# Patient Record
Sex: Female | Born: 1955 | Race: White | Hispanic: Yes | Marital: Single | Smoking: Never smoker
Health system: Northeastern US, Community
[De-identification: ages and names within clinical notes are randomized; demographics above are authoritative.]

## PROBLEM LIST (undated history)

## (undated) ENCOUNTER — Emergency Department (HOSPITAL_COMMUNITY): Admission: EM | Payer: Medicare (Managed Care) | Source: Home / Self Care

## (undated) DIAGNOSIS — F419 Anxiety disorder, unspecified: Secondary | ICD-10-CM

## (undated) DIAGNOSIS — F32A Depression, unspecified: Secondary | ICD-10-CM

## (undated) DIAGNOSIS — F329 Major depressive disorder, single episode, unspecified: Secondary | ICD-10-CM

## (undated) DIAGNOSIS — H5210 Myopia, unspecified eye: Secondary | ICD-10-CM

## (undated) DIAGNOSIS — H524 Presbyopia: Secondary | ICD-10-CM

## (undated) DIAGNOSIS — Z973 Presence of spectacles and contact lenses: Secondary | ICD-10-CM

## (undated) DIAGNOSIS — M899 Disorder of bone, unspecified: Secondary | ICD-10-CM

## (undated) DIAGNOSIS — E079 Disorder of thyroid, unspecified: Secondary | ICD-10-CM

## (undated) DIAGNOSIS — H52209 Unspecified astigmatism, unspecified eye: Secondary | ICD-10-CM

## (undated) DIAGNOSIS — H269 Unspecified cataract: Secondary | ICD-10-CM

## (undated) DIAGNOSIS — D509 Iron deficiency anemia, unspecified: Secondary | ICD-10-CM

## (undated) DIAGNOSIS — F321 Major depressive disorder, single episode, moderate: Secondary | ICD-10-CM

## (undated) DIAGNOSIS — Z780 Asymptomatic menopausal state: Secondary | ICD-10-CM

## (undated) DIAGNOSIS — M949 Disorder of cartilage, unspecified: Secondary | ICD-10-CM

## (undated) HISTORY — PX: IMPLANT MESH OPN HERNIA RPR/DEBRIDEMENT CLOSURE: PRO145

## (undated) HISTORY — DX: Disorder of cartilage, unspecified: M94.9

## (undated) HISTORY — DX: Myopia, unspecified eye: H52.10

## (undated) HISTORY — DX: Presbyopia: H52.4

## (undated) HISTORY — DX: Major depressive disorder, single episode, moderate: F32.1

## (undated) HISTORY — PX: EXCISION TONSIL TAGS: ENT179

## (undated) HISTORY — DX: Unspecified cataract: H26.9

## (undated) HISTORY — DX: Iron deficiency anemia, unspecified: D50.9

## (undated) HISTORY — DX: Disorder of bone, unspecified: M89.9

## (undated) HISTORY — DX: Disorder of thyroid, unspecified: E07.9

## (undated) HISTORY — DX: Asymptomatic menopausal state: Z78.0

## (undated) HISTORY — DX: Unspecified astigmatism, unspecified eye: H52.209

## (undated) HISTORY — PX: CATARACT REMOVAL INSERTION OF LENS: OPH121

## (undated) HISTORY — PX: HERNIA REPAIR: SHX51

## (undated) HISTORY — PX: TONSILLECTOMY: SUR1361

---

## 2002-06-10 ENCOUNTER — Emergency Department (HOSPITAL_BASED_OUTPATIENT_CLINIC_OR_DEPARTMENT_OTHER): Payer: Self-pay | Admitting: Emergency Medicine

## 2002-06-10 LAB — FINGER, RIGHT 3 VIEWS

## 2002-06-12 ENCOUNTER — Emergency Department (HOSPITAL_BASED_OUTPATIENT_CLINIC_OR_DEPARTMENT_OTHER): Payer: Self-pay | Admitting: Emergency Medicine

## 2002-06-20 ENCOUNTER — Emergency Department (HOSPITAL_BASED_OUTPATIENT_CLINIC_OR_DEPARTMENT_OTHER): Payer: Self-pay | Admitting: Emergency Medicine

## 2002-06-28 ENCOUNTER — Other Ambulatory Visit: Payer: Self-pay

## 2002-06-28 DIAGNOSIS — E0590 Thyrotoxicosis, unspecified without thyrotoxic crisis or storm: Secondary | ICD-10-CM

## 2002-06-28 LAB — TSH (THYROID STIMULATING HORMONE): TSH (THYROID STIM HORMONE): 0.14 u[IU]/mL — ABNORMAL LOW (ref 0.34–5.60)

## 2002-06-28 LAB — CHG ASSAY OF THYROXINE TOTAL: THYROXINE (T4): 9.39 ug/dl (ref 6.09–12.23)

## 2002-08-11 DIAGNOSIS — Z780 Asymptomatic menopausal state: Secondary | ICD-10-CM

## 2002-08-11 HISTORY — DX: Asymptomatic menopausal state: Z78.0

## 2003-09-16 ENCOUNTER — Emergency Department (HOSPITAL_BASED_OUTPATIENT_CLINIC_OR_DEPARTMENT_OTHER): Payer: Self-pay | Admitting: Emergency Medicine

## 2003-09-18 ENCOUNTER — Emergency Department (HOSPITAL_BASED_OUTPATIENT_CLINIC_OR_DEPARTMENT_OTHER): Payer: Self-pay | Admitting: Emergency Medicine

## 2003-09-26 ENCOUNTER — Emergency Department (HOSPITAL_BASED_OUTPATIENT_CLINIC_OR_DEPARTMENT_OTHER): Payer: Self-pay | Admitting: Emergency Medicine

## 2003-09-26 LAB — XR HAND LEFT MINIMUM 3 VIEWS

## 2003-09-28 ENCOUNTER — Ambulatory Visit (HOSPITAL_BASED_OUTPATIENT_CLINIC_OR_DEPARTMENT_OTHER): Payer: Self-pay | Admitting: Plastic Surgery

## 2003-10-19 ENCOUNTER — Ambulatory Visit (HOSPITAL_BASED_OUTPATIENT_CLINIC_OR_DEPARTMENT_OTHER): Payer: Self-pay | Admitting: Plastic Surgery

## 2003-12-17 ENCOUNTER — Emergency Department (HOSPITAL_BASED_OUTPATIENT_CLINIC_OR_DEPARTMENT_OTHER): Payer: Self-pay | Admitting: Emergency Medicine

## 2003-12-21 ENCOUNTER — Encounter (HOSPITAL_BASED_OUTPATIENT_CLINIC_OR_DEPARTMENT_OTHER): Payer: Self-pay | Admitting: Nurse Practitioner

## 2003-12-21 ENCOUNTER — Ambulatory Visit (HOSPITAL_BASED_OUTPATIENT_CLINIC_OR_DEPARTMENT_OTHER): Payer: Self-pay | Admitting: Nurse Practitioner

## 2003-12-27 ENCOUNTER — Ambulatory Visit (HOSPITAL_BASED_OUTPATIENT_CLINIC_OR_DEPARTMENT_OTHER): Payer: Self-pay | Admitting: Nurse Practitioner

## 2003-12-29 ENCOUNTER — Ambulatory Visit (HOSPITAL_BASED_OUTPATIENT_CLINIC_OR_DEPARTMENT_OTHER): Payer: Self-pay | Admitting: Plastic Surgery

## 2004-01-01 ENCOUNTER — Encounter (HOSPITAL_BASED_OUTPATIENT_CLINIC_OR_DEPARTMENT_OTHER): Payer: Self-pay | Admitting: Nurse Practitioner

## 2004-01-01 ENCOUNTER — Ambulatory Visit (HOSPITAL_BASED_OUTPATIENT_CLINIC_OR_DEPARTMENT_OTHER): Payer: MEDICARE | Admitting: Nurse Practitioner

## 2004-01-05 ENCOUNTER — Ambulatory Visit (HOSPITAL_BASED_OUTPATIENT_CLINIC_OR_DEPARTMENT_OTHER): Payer: MEDICARE | Admitting: Nurse Practitioner

## 2004-01-05 ENCOUNTER — Encounter (HOSPITAL_BASED_OUTPATIENT_CLINIC_OR_DEPARTMENT_OTHER): Payer: Self-pay | Admitting: Plastic Surgery

## 2004-01-23 ENCOUNTER — Ambulatory Visit (HOSPITAL_BASED_OUTPATIENT_CLINIC_OR_DEPARTMENT_OTHER): Payer: MEDICARE | Admitting: Internal Medicine

## 2004-01-23 ENCOUNTER — Encounter (HOSPITAL_BASED_OUTPATIENT_CLINIC_OR_DEPARTMENT_OTHER): Payer: Self-pay | Admitting: Internal Medicine

## 2004-01-23 DIAGNOSIS — F4312 Post-traumatic stress disorder, chronic: Secondary | ICD-10-CM

## 2004-01-23 DIAGNOSIS — R5381 Other malaise: Principal | ICD-10-CM

## 2004-01-23 DIAGNOSIS — R5382 Chronic fatigue, unspecified: Secondary | ICD-10-CM | POA: Insufficient documentation

## 2004-01-23 DIAGNOSIS — F3289 Other specified depressive episodes: Secondary | ICD-10-CM

## 2004-01-23 DIAGNOSIS — F329 Major depressive disorder, single episode, unspecified: Secondary | ICD-10-CM

## 2004-01-23 DIAGNOSIS — R5383 Other fatigue: Principal | ICD-10-CM

## 2004-01-24 ENCOUNTER — Other Ambulatory Visit (HOSPITAL_BASED_OUTPATIENT_CLINIC_OR_DEPARTMENT_OTHER): Payer: Self-pay | Admitting: Internal Medicine

## 2004-01-24 DIAGNOSIS — R5381 Other malaise: Principal | ICD-10-CM

## 2004-01-24 DIAGNOSIS — R5383 Other fatigue: Principal | ICD-10-CM

## 2004-01-24 LAB — BLOOD COUNT COMPLETE AUTO&AUTO DIFRNTL WBC
BASOPHIL %: 0.7 % (ref 0–2)
EOSINOPHIL %: 1.1 % (ref 0–7)
HEMATOCRIT: 39 % (ref 37.0–47.0)
HEMOGLOBIN: 13.5 g/dL (ref 12.0–16.0)
LYMPHOCYTE %: 33.6 % (ref 12–39)
MEAN CORP HGB CONC: 34.5 g/dL (ref 32.0–36.0)
MEAN CORPUSCULAR HGB: 33.2 pg — ABNORMAL HIGH (ref 27.0–31.0)
MEAN CORPUSCULAR VOL: 96 fL (ref 81.0–99.0)
MEAN PLATELET VOLUME: 9.4 fL (ref 6.4–10.8)
MONOCYTE %: 8 % (ref 1–12)
NEUTROPHIL %: 56.6 % (ref 46–79)
PLATELET COUNT: 262 10*3/uL (ref 150–400)
RBC DISTRIBUTION WIDTH: 12.9 % (ref 11.5–14.3)
RED BLOOD CELL COUNT: 4.06 MIL/uL — ABNORMAL LOW (ref 4.20–5.40)
WHITE BLOOD CELL COUNT: 4.4 10*3/uL — ABNORMAL LOW (ref 4.8–10.8)

## 2004-01-25 ENCOUNTER — Encounter (HOSPITAL_BASED_OUTPATIENT_CLINIC_OR_DEPARTMENT_OTHER): Payer: Self-pay | Admitting: Internal Medicine

## 2004-01-25 LAB — CHG HEPATIC FUNCTION PANEL
ALANINE AMINOTRANSFERASE: 18 IU/L (ref 3–36)
ALBUMIN: 4.3 g/dl (ref 3.5–5.0)
ALKALINE PHOSPHATASE: 52 IU/L (ref 50–136)
ASPARTATE AMINOTRANSFERASE: 25 U/L (ref 7–29)
BILIRUBIN DIRECT: 0.1 mg/dl (ref 0–0.36)
BILIRUBIN TOTAL: 0.8 mg/dl (ref 0.15–1.0)
TOTAL PROTEIN: 6.8 g/dl (ref 6.2–8.5)

## 2004-01-25 LAB — CHG ASSAY OF FREE THYROXINE: FREE THYROXINE: 1.1 ng/dl (ref 0.55–1.12)

## 2004-01-25 LAB — CHG LIPID PANEL
Cholesterol: 200 mg/dl (ref 0–200)
HIGH DENSITY LIPOPROTEIN: 55 mg/dl (ref 35–95)
LOW DENSITY LIPOPROTEIN DIRECT: 137 mg/dl — ABNORMAL HIGH (ref ?–130)
RISK FACTOR: 3.6 (ref ?–4.4)
TRIGLYCERIDES: 56 mg/dl (ref 30–200)

## 2004-01-25 LAB — BASIC METABOLIC PANEL
BUN (UREA NITROGEN): 17 mg/dl (ref 10–20)
CALCIUM: 9.7 mg/dl (ref 8.5–10.5)
CARBON DIOXIDE: 30 mEQ/L (ref 22–32)
CHLORIDE: 104 mEQ/L (ref 98–110)
CREATININE: 0.8 mg/dl (ref 0.8–1.2)
Glucose Random: 96 mg/dl (ref 65–160)
POTASSIUM: 4.3 mEQ/L (ref 3.5–5.0)
SODIUM: 139 mEQ/L (ref 135–145)

## 2004-01-25 LAB — CYANOCOBALAMIN VITAMIN B-12: VITAMIN B12: 736 pg/ml (ref 180–914)

## 2004-01-25 LAB — THYROID SCREEN TSH REFLEX FT4: THYROID SCREEN TSH REFLEX FT4: 0.15 u[IU]/mL — ABNORMAL LOW (ref 0.34–5.60)

## 2004-01-25 LAB — CHG ASSAY OF FOLIC ACID SERUM: FOLATE: 18.1 ng/ml (ref 3.0–?)

## 2004-01-25 LAB — HEPATITIS C ANTIBODY: HEPATITIS C ANTIBODY: NEGATIVE

## 2004-01-25 LAB — CHG ASSAY OF FERRITIN: FERRITIN: 28 ng/ml (ref 11–307)

## 2004-01-26 ENCOUNTER — Encounter (HOSPITAL_BASED_OUTPATIENT_CLINIC_OR_DEPARTMENT_OTHER): Payer: Self-pay | Admitting: Internal Medicine

## 2004-02-08 ENCOUNTER — Encounter (HOSPITAL_BASED_OUTPATIENT_CLINIC_OR_DEPARTMENT_OTHER): Payer: MEDICARE | Admitting: Dermatology

## 2004-02-20 ENCOUNTER — Telehealth (HOSPITAL_BASED_OUTPATIENT_CLINIC_OR_DEPARTMENT_OTHER): Payer: Self-pay

## 2004-02-20 NOTE — Telephone Encounter (Addendum)
no answer at given number. Message routed to Dr Creed Copper

## 2004-03-13 ENCOUNTER — Ambulatory Visit (HOSPITAL_BASED_OUTPATIENT_CLINIC_OR_DEPARTMENT_OTHER): Payer: MEDICARE | Admitting: Internal Medicine

## 2004-03-13 VITALS — BP 106/66 | Wt 113.0 lb

## 2004-03-13 DIAGNOSIS — E0590 Thyrotoxicosis, unspecified without thyrotoxic crisis or storm: Secondary | ICD-10-CM

## 2004-03-13 DIAGNOSIS — L819 Disorder of pigmentation, unspecified: Secondary | ICD-10-CM

## 2004-03-13 DIAGNOSIS — Z803 Family history of malignant neoplasm of breast: Secondary | ICD-10-CM

## 2004-03-13 DIAGNOSIS — N951 Menopausal and female climacteric states: Principal | ICD-10-CM

## 2004-03-13 DIAGNOSIS — M899 Disorder of bone, unspecified: Secondary | ICD-10-CM

## 2004-03-13 DIAGNOSIS — M949 Disorder of cartilage, unspecified: Secondary | ICD-10-CM

## 2004-03-13 NOTE — Patient Instructions (Addendum)
Labs 6/05: excellent/normal; "subclinical hyperthyroidism" means TSH a bit low, "thyroxine" normal - a lab finding only; not related to fatigue; almost opposite of hypothyroidism and not connected with fatigue etc. - can check again in 3-6 months. I've entered the "lab order" in computer - come any time without appointment to get thyroid tests re-checked - I'll write results.    Hirsutism (excess hair growth): no effective treatments re: creams, tablets etc...laser, electrolysis.    Hot flashes: treatment options: yoga, relaxation, meditation, SSRI anti-depressants, Black Cohosh, (?Remifemin 20 mg twice daily), more exercise - can help.  Prescription treatments: clonidine - started as an anti-depressant; can really work; side effects: dry mouth. If bad dry mouth with pill, can try patch.

## 2004-03-13 NOTE — Progress Notes (Signed)
Cydnee Fuquay is a 48 year old female with fatigue and depression;  Issues today:   Perimenopause: about 4 hot flashes per day; uncomfortable; annoying. 18 mos. since last period. Would like to know what her options are re: menopausal symptoms. Wonders if her fatigue is hormonally-mediated. Are there labs to check for this?  Also: had a small bump frozen off (actinic keratosis?) at left side tip of nose few years ago; sometimes notes brownish spot there; is this a problem?    OBJECTIVE:   Derm: faint suggestion of post-inflammatory hyperpigmentation left side tip of nose; no palpable lesion.  Affect: calmer and somewhat brighter than at last visit.    BMD done at [facility] 03/22/03: osteopenia at femoral neck; normal lumbar spine.    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 instructions"; she opts for trial exercise and Black Cohosh.   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  V16.3 FAMILY HX-BREAST MALIG: due for mammo 03/28/04: req'd.  733.90 Osteopenia: reviewed calcium intake.    Return to clinic 4/06 for next annual or sooner if needed.

## 2004-03-14 DIAGNOSIS — Z803 Family history of malignant neoplasm of breast: Secondary | ICD-10-CM

## 2004-04-03 ENCOUNTER — Ambulatory Visit (HOSPITAL_BASED_OUTPATIENT_CLINIC_OR_DEPARTMENT_OTHER): Payer: MEDICARE | Admitting: Internal Medicine

## 2004-09-20 ENCOUNTER — Ambulatory Visit (HOSPITAL_BASED_OUTPATIENT_CLINIC_OR_DEPARTMENT_OTHER): Payer: MEDICARE | Admitting: Internal Medicine

## 2004-09-20 VITALS — BP 102/62 | Wt 112.0 lb

## 2004-09-20 DIAGNOSIS — IMO0002 Reserved for concepts with insufficient information to code with codable children: Secondary | ICD-10-CM

## 2004-09-21 NOTE — Progress Notes (Signed)
49 year old woman here for evaluation of thumb laceration on left hand. Incurred cut on left thumb when she was cutting something last week with a knife. Did not go to ED afterward. No longer "oozes", no warmth, tenderness, but feels concerned because it has not completely healed.    Exam  Brianna [name]  Left thumb: 5mm laceration, ends beginning to approximate, no erythema, warmth, tenderness, dermis completely lacerated    A/p  Finger lac  Explained to patient that she would have been best off going to ed for a single suture, and that at this stage it is too late to do so.    Advised her to use a bandaid over the lac to help bring the ends closer together, and that they would continue to approximate. No e/o infection

## 2005-01-21 ENCOUNTER — Telehealth (HOSPITAL_BASED_OUTPATIENT_CLINIC_OR_DEPARTMENT_OTHER): Payer: Self-pay

## 2005-01-21 NOTE — Telephone Encounter (Signed)
Staff Message copied by Forbes Cellar on 01/21/2005 at 3:45 PM  ------   Message from: Norwood Levo   Created: 01/21/2005 at 3:41 PM   Regarding: ACCESS   Contact: [phone]    Sole Lengacher [phone], 49 year old, female,     Cleotis Lema NUMBER: [phone]    Patient's language of care: English    Patient does not need an interpreter.    Person calling on behalf of patient: patient (self)    Calls today HER FINGER HURTS AND CAN'T BEND. NOW HER WHOLE ARM IS STARTING TO FEEL SORE. DECLINED FUTURE APPOINTMENT. WOULD LIKE TO BE SEEN TODAY.

## 2005-01-21 NOTE — Telephone Encounter (Signed)
Past 2 weeks right middle finger difficulty bending with some swelling,no reddness, not painful, sensation WNL, finger nail ok, no fever,no trauma no red line up arm. Appointment given for tomorrow. Instructed pt any changes got to ER

## 2005-01-24 ENCOUNTER — Ambulatory Visit (HOSPITAL_BASED_OUTPATIENT_CLINIC_OR_DEPARTMENT_OTHER): Payer: MEDICARE | Admitting: Internal Medicine

## 2005-01-24 VITALS — BP 110/68 | Wt 112.0 lb

## 2005-01-24 DIAGNOSIS — M6530 Trigger finger, unspecified finger: Secondary | ICD-10-CM

## 2005-01-24 NOTE — Progress Notes (Signed)
SUBJECTIVE:  Says right 3rd finger swollen off & on for the last month or so, not painful, but having trouble bending it at times. Denies any trauma to area. Doing a lot of writing recently. Would get stuck in flexed position, now last 2 days having trouble bending it fully.    OBJECTIVE:  BP 110/68  Wt 112 lbs (50.8kg)   pleasant woman in no apparent distress  Right hand: mild soft tissue swelling around 3rd metacarpal, nontender. Sensation normal all digits. No sign of carpal tunnel syndrome. Some triggering noted - full flexion of 3rd finger, needs assistance to extend.    ASSESSMENT:  727.03 TRIGGER FINGER (primary encounter diagnosis)  Note: Advised right 3rd trigger finger, needs Orthopedics eval, possible steroid injection or release.  Plan: REFERRAL TO ORTHOPEDICS (INT)

## 2005-02-25 ENCOUNTER — Encounter (HOSPITAL_BASED_OUTPATIENT_CLINIC_OR_DEPARTMENT_OTHER): Payer: MEDICARE | Admitting: Physician Assistant

## 2005-02-25 DIAGNOSIS — M6530 Trigger finger, unspecified finger: Secondary | ICD-10-CM

## 2005-02-26 LAB — ORTHOPEDIC OFFICE NOTE

## 2005-09-15 ENCOUNTER — Ambulatory Visit (HOSPITAL_BASED_OUTPATIENT_CLINIC_OR_DEPARTMENT_OTHER): Payer: MEDICARE | Admitting: Internal Medicine

## 2005-09-15 VITALS — BP 102/66 | Wt 115.0 lb

## 2005-09-15 DIAGNOSIS — M549 Dorsalgia, unspecified: Principal | ICD-10-CM

## 2005-09-15 NOTE — Progress Notes (Signed)
SUBJECTIVE:  Notes pain in mid-back, told it was muscular, saw Physical Therapy in [place], advised she needs a referral. No specific trauma except a lot of bending, twisting recently. No chest pain, shortness of breath.    OBJECTIVE:  BP 102/66  Wt 115 lbs (52.2kg)   pleasant female in no apparent distress   Back: tender muscular area right side mid-back    ASSESSMENT:  724.5 BACKACHE NOS (primary encounter diagnosis)  Note: Appears muscular. Agree with physical therapy.  Plan: REFERRAL TO PHYSICAL THERAPY (EXT)       follow up as needed.

## 2005-11-17 ENCOUNTER — Ambulatory Visit (HOSPITAL_BASED_OUTPATIENT_CLINIC_OR_DEPARTMENT_OTHER): Payer: MEDICARE | Admitting: Internal Medicine

## 2005-11-17 VITALS — BP 110/80 | Ht 63.0 in | Wt 116.5 lb

## 2005-11-17 DIAGNOSIS — R079 Chest pain, unspecified: Principal | ICD-10-CM

## 2005-11-17 DIAGNOSIS — Z139 Encounter for screening, unspecified: Secondary | ICD-10-CM

## 2005-11-17 LAB — CHG LIPID PANEL
Cholesterol: 189 mg/dl (ref 0–200)
HIGH DENSITY LIPOPROTEIN: 49 mg/dl (ref 35–85)
LOW DENSITY LIPOPROTEIN DIRECT: 106 mg/dl — ABNORMAL HIGH (ref 0–100)
RISK FACTOR: 3.9 (ref ?–4.4)
TRIGLYCERIDES: 54 mg/dl (ref 0–150)

## 2005-11-17 LAB — CHG ASSAY OF FREE THYROXINE: FREE THYROXINE: 0.89 ng/dl (ref 0.55–1.12)

## 2005-11-17 LAB — THYROID SCREEN TSH REFLEX FT4: THYROID SCREEN TSH REFLEX FT4: 0.29 u[IU]/mL — ABNORMAL LOW (ref 0.34–5.60)

## 2005-11-17 NOTE — Progress Notes (Signed)
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  V16.3 FAMILY HX-BREAST MALIG: due for mammo 03/28/04: req'd.  Return to clinic 4/06 for next annual or sooner if needed.     Sched'd sick visit today re: chest pain.  CRFs: none.  HPI: chest pain episodic x 2-3 weeks - indicates L upper chest - "like a grabbing" - sharp - lasts less than a minute - yesterday 2 especially sharp episodes -  Can identify no precipitants; once sitting on T, once sitting in car, once walking down street.  No associated sx's, no numbness/tingling, no lightheadedness, no nausea or panic feelings, no palpitations, no particular positions.   Exercise: walks 15 min/d, sometimes more - 1/2 - 1 hr - feels fine those times, no SSCP. No SOB when walks up 4 flights daily.  ROS: only rare GERD, no heartburn or dyspepsia lately.  Musculoskeletal: no aches or pains. except along shoulders and neck and mid-scapula sometimes. - in Feb.    OBJECTIVE:   BP 110/80  Ht 5\' 3"  (1.41m)  Wt 116 lbs 8.0 oz (52.8kg)   Physical exam: she appears well, alert and appropriate.  Chest is clear to auscultation.   Heart sounds: normal S1 and S2, regular rate and rhythm, no murmurs.   Chest wall: slight tenderness to palpation of chest wall diffusely above L breast.   Breasts are symmetric. No dominant, discrete, fixed or suspicious masses are noted. No skin or nipple changes or axillary nodes.   Musculoskeletal: FROM OK at L shoulder, LUE pulses and DTRs and strength/sens intact. No tenderness to palpation.     EKG: NSR at 56, nl axis, nl intervals, no ST-T changes: nl EKG.     ASSESSMENT/PLAN:   Chest wall pain by hx, exam - Reviewed etiology, normal course, management of. Rvwd presentation of cardiac chest pain, to go to ER if more typical cardiac sx's.  RHM, hyperthyroidism, breast ca screening, crc screening: needs. Labs today, schedule mammo, f/u for pelvic exam.

## 2005-12-09 ENCOUNTER — Other Ambulatory Visit (HOSPITAL_BASED_OUTPATIENT_CLINIC_OR_DEPARTMENT_OTHER): Payer: Self-pay | Admitting: Internal Medicine

## 2005-12-09 LAB — MA SCREENING MAMMO BILATERAL WITH CAD

## 2005-12-23 ENCOUNTER — Other Ambulatory Visit (HOSPITAL_BASED_OUTPATIENT_CLINIC_OR_DEPARTMENT_OTHER): Payer: Self-pay | Admitting: Internal Medicine

## 2006-01-06 ENCOUNTER — Ambulatory Visit (HOSPITAL_BASED_OUTPATIENT_CLINIC_OR_DEPARTMENT_OTHER): Payer: MEDICARE | Admitting: Internal Medicine

## 2006-01-06 VITALS — BP 120/80 | Wt 116.0 lb

## 2006-01-06 DIAGNOSIS — Z124 Encounter for screening for malignant neoplasm of cervix: Secondary | ICD-10-CM

## 2006-01-06 DIAGNOSIS — N952 Postmenopausal atrophic vaginitis: Secondary | ICD-10-CM

## 2006-01-06 DIAGNOSIS — R5381 Other malaise: Secondary | ICD-10-CM

## 2006-01-06 DIAGNOSIS — IMO0002 Reserved for concepts with insufficient information to code with codable children: Secondary | ICD-10-CM

## 2006-01-06 DIAGNOSIS — F4310 Post-traumatic stress disorder, unspecified: Secondary | ICD-10-CM

## 2006-01-06 DIAGNOSIS — R5383 Other fatigue: Secondary | ICD-10-CM

## 2006-01-06 MED ORDER — ESTRACE 0.1 MG/GM VA CREA
TOPICAL_CREAM | VAGINAL | Status: DC
Start: 2006-01-06 — End: 2006-12-21

## 2006-01-06 NOTE — Progress Notes (Signed)
Brianna [name] is a 50 year old female   #1: fatigue. Wonders if CFIDS, but hard to sort out from depression.  #2: dissociation, flashbacks. ? A "conversation" with someone from [facility], a psychiatrist etc - wonders re: ptsd and repressed memories; has no memories of trauma.   #3: physical issues:  - tiny bumps sides of eyes, behind ears  - something itching lower back - felt sudden stinging sensation there, yesterday pm.  - ?cyst upper back  - pain: R trapezius area - PT helps.   She is post menopausal and reports no vaginal bleeding, discharge, vaginal discomfort or pelvic pain. No hot flashes. Last sexually active 1-2 yrs ago, had some dyspareunia then.    OBJECTIVE:   BP 120/80  Wt 116 lbs (52.6kg)  LMP Postmenopausal  Physical exam: she appears well, alert and appropriate, affect: anxious initially. She has a few areas of milia at areas she indicates near eyes and behind pinna. Neck supple, without adenopathy or masses. Back: 3-mm sebaceous cyst upper back; 4-mm patch of erythema blanches with pressure L side low back, looks consistent with insect bite.   Chest is clear to auscultation. Heart sounds: normal S1 and S2, regular rate and rhythm, no murmurs. Abdomen is soft, no tenderness, masses or organomegaly. Breasts: normal without suspicious masses, skin or nipple changes or axillary nodes. Pelvis: EGBUS within normal limits, trace fissure consistent with slight candida at perineum, introitus: atrophic mucosa, normal vaginal vault, normal cervix without lesions. Uterus is normal size and shape, no masses or tenderness. Adnexae normal, without masses or tenderness. Extremities are normal, without edema. Peripheral pulses are normal. Screening neurological exam is normal without focal findings. Skin is normal without suspicious lesions noted.     ASSESSMENT/PLAN:   RHM: cpe done.  780.79 MALAISE AND FATIGUE NEC labs ordered, as pnd.  627.3 ATROPHIC VAGINITIS: rx'd.  911.4 INSECT BITE TRUNK: expectant  management.  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

## 2006-01-20 DIAGNOSIS — Z139 Encounter for screening, unspecified: Secondary | ICD-10-CM

## 2006-01-20 LAB — CYTOPATH, C/V, THIN LAYER

## 2006-02-06 ENCOUNTER — Other Ambulatory Visit (HOSPITAL_BASED_OUTPATIENT_CLINIC_OR_DEPARTMENT_OTHER): Payer: MEDICARE | Admitting: Lab

## 2006-02-06 DIAGNOSIS — D509 Iron deficiency anemia, unspecified: Secondary | ICD-10-CM

## 2006-02-06 DIAGNOSIS — R5383 Other fatigue: Principal | ICD-10-CM

## 2006-02-06 DIAGNOSIS — R5381 Other malaise: Principal | ICD-10-CM

## 2006-02-06 LAB — GLUCOSE RANDOM: Glucose Random: 111 mg/dl (ref 74–160)

## 2006-02-06 LAB — CHG ASSAY OF FERRITIN: FERRITIN: 20 ng/mL (ref 11–307)

## 2006-02-06 LAB — BLOOD COUNT COMPLETE AUTOMATED
HEMATOCRIT: 35.6 % — ABNORMAL LOW (ref 37.0–47.0)
HEMOGLOBIN: 12.4 g/dL (ref 12.0–16.0)
MEAN CORP HGB CONC: 34.8 g/dL (ref 32.0–36.0)
MEAN CORPUSCULAR HGB: 32.2 pg — ABNORMAL HIGH (ref 27.0–31.0)
MEAN CORPUSCULAR VOL: 92.6 fL (ref 81.0–99.0)
MEAN PLATELET VOLUME: 9.4 fL (ref 6.4–10.8)
PLATELET COUNT: 259 10*3/uL (ref 150–400)
RBC DISTRIBUTION WIDTH: 13.4 % (ref 11.5–14.3)
RED BLOOD CELL COUNT: 3.85 MIL/uL — ABNORMAL LOW (ref 4.20–5.40)
WHITE BLOOD CELL COUNT: 4.4 10*3/uL — ABNORMAL LOW (ref 4.8–10.8)

## 2006-02-06 LAB — TRANSFERASE ALANINE AMINO ALT SGPT: ALANINE AMINOTRANSFERASE: 15 IU/L (ref 7–35)

## 2006-02-06 LAB — CYANOCOBALAMIN VITAMIN B-12: VITAMIN B12: 372 pg/mL (ref 180–914)

## 2006-02-06 LAB — THYROID SCREEN TSH REFLEX FT4: THYROID SCREEN TSH REFLEX FT4: 0.44 u[IU]/mL (ref 0.34–5.60)

## 2006-02-06 NOTE — Nursing Note (Signed)
>>   Brianna [name] [facility]     02/13/2006   8:51 am  Labs drawn.

## 2006-02-09 LAB — VITAMIN D,25 HYDROXY: VITAMIN D,25 HYDROXY: 34.2 ng/mL (ref 32.0–100.0)

## 2006-02-15 DIAGNOSIS — D509 Iron deficiency anemia, unspecified: Secondary | ICD-10-CM

## 2006-02-15 HISTORY — DX: Iron deficiency anemia, unspecified: D50.9

## 2006-02-15 NOTE — Progress Notes (Addendum)
Addended by: Loann Quill on: 02/15/2006 10:04:12 PM     Modules accepted: Orders

## 2006-02-18 ENCOUNTER — Ambulatory Visit (HOSPITAL_BASED_OUTPATIENT_CLINIC_OR_DEPARTMENT_OTHER): Payer: Self-pay

## 2006-02-18 DIAGNOSIS — Z0120 Encounter for dental examination and cleaning without abnormal findings: Principal | ICD-10-CM

## 2006-03-05 ENCOUNTER — Encounter (HOSPITAL_BASED_OUTPATIENT_CLINIC_OR_DEPARTMENT_OTHER): Payer: Self-pay | Admitting: Internal Medicine

## 2006-04-17 ENCOUNTER — Encounter (HOSPITAL_BASED_OUTPATIENT_CLINIC_OR_DEPARTMENT_OTHER): Payer: Self-pay

## 2006-04-21 ENCOUNTER — Encounter (HOSPITAL_BASED_OUTPATIENT_CLINIC_OR_DEPARTMENT_OTHER): Payer: Self-pay

## 2006-05-04 ENCOUNTER — Encounter (HOSPITAL_BASED_OUTPATIENT_CLINIC_OR_DEPARTMENT_OTHER): Payer: Self-pay | Admitting: Psychologist

## 2006-05-04 ENCOUNTER — Ambulatory Visit (HOSPITAL_BASED_OUTPATIENT_CLINIC_OR_DEPARTMENT_OTHER): Payer: MEDICARE | Admitting: Psychologist

## 2006-05-04 ENCOUNTER — Ambulatory Visit (HOSPITAL_BASED_OUTPATIENT_CLINIC_OR_DEPARTMENT_OTHER): Payer: MEDICAID | Admitting: Psychologist

## 2006-05-04 DIAGNOSIS — F331 Major depressive disorder, recurrent, moderate: Secondary | ICD-10-CM

## 2006-05-04 DIAGNOSIS — F4481 Dissociative identity disorder: Secondary | ICD-10-CM
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
## 2006-05-08 ENCOUNTER — Encounter (HOSPITAL_BASED_OUTPATIENT_CLINIC_OR_DEPARTMENT_OTHER): Payer: Self-pay | Admitting: Internal Medicine

## 2006-05-25 ENCOUNTER — Telehealth (HOSPITAL_BASED_OUTPATIENT_CLINIC_OR_DEPARTMENT_OTHER): Payer: Self-pay | Admitting: Psychologist

## 2006-05-25 NOTE — Telephone Encounter (Signed)
Left message for Pt that she has been referred to [facility] and will be called for tx pending confirmation of insurance.

## 2006-06-02 ENCOUNTER — Telehealth (HOSPITAL_BASED_OUTPATIENT_CLINIC_OR_DEPARTMENT_OTHER): Payer: Self-pay | Admitting: Psychologist

## 2006-06-02 NOTE — Telephone Encounter (Signed)
Spoke with Pt who asks if she calls and tries to process material about her former tx with her former therapist, will he Research scientist (medical). She also asks if she contacts former therapist and sees him will this affect where she is on the waitlist at [facility].    I do not know answer to former and report to latter that there should be no effect at all on her place in the waitlist.

## 2006-06-17 ENCOUNTER — Telehealth (HOSPITAL_BASED_OUTPATIENT_CLINIC_OR_DEPARTMENT_OTHER): Payer: Self-pay | Admitting: Psychologist

## 2006-06-17 NOTE — Telephone Encounter (Signed)
Returned Pt's message from today. Pt asking about how long she will have to wait to begin tx.  Left Pt a message, as she said I could, that we are trying to determine in which group she will best served, [facility] or [facility].  I told her a senior clinician, Melany Guernsey, will be contacting her for one or two appointments so we can best determine placement esp in light of Dr. Ria Bush referral suggestion for [facility].  I also said she was at the top of the list for [facility] and that the consultation process would not delay her starting tx.

## 2006-06-25 ENCOUNTER — Ambulatory Visit (HOSPITAL_BASED_OUTPATIENT_CLINIC_OR_DEPARTMENT_OTHER): Payer: MEDICARE | Admitting: Clinical

## 2006-06-25 ENCOUNTER — Encounter (HOSPITAL_BASED_OUTPATIENT_CLINIC_OR_DEPARTMENT_OTHER): Payer: Self-pay | Admitting: Clinical

## 2006-06-25 DIAGNOSIS — F4310 Post-traumatic stress disorder, unspecified: Secondary | ICD-10-CM

## 2006-06-29 ENCOUNTER — Telehealth (HOSPITAL_BASED_OUTPATIENT_CLINIC_OR_DEPARTMENT_OTHER): Payer: Self-pay | Admitting: Clinical

## 2006-06-29 NOTE — Telephone Encounter (Signed)
Contacted trauma clinic and faxewd release to Adron Bene at [phone]

## 2006-06-29 NOTE — Progress Notes (Signed)
Met with patient for evaluation as to where she would best receive services. Patient reported struggles with dissociation dating back for an extended period of time. She does not report a history of significant trauma consistent with the dissociative identity disorder diagnosis that she had been given. She clearly, however, reports extensive dissociation. Patient has been in treatment for most of the past few decades. She is unaware if she has had any nueropsych testing. I asked patient to sign a release so that I can request these records.

## 2006-07-09 ENCOUNTER — Ambulatory Visit (HOSPITAL_BASED_OUTPATIENT_CLINIC_OR_DEPARTMENT_OTHER): Payer: MEDICARE | Admitting: Clinical

## 2006-07-09 DIAGNOSIS — F4310 Post-traumatic stress disorder, unspecified: Principal | ICD-10-CM

## 2006-07-13 NOTE — Progress Notes (Signed)
Brianna [name] attended second session on this date. Focused upon desired counseling goals and life goals. Also signed release to [facility] as the trauma clinic no longer holds their own medical records from the time she was in treatment there. [name] could clearly benefit from inidividual treatment in conjunction with psychopharm. At this time she is more open to the idea of considering medication. I am also requesting the past records to see if there has been testing. If not, a testing request will also be made.

## 2006-08-17 ENCOUNTER — Ambulatory Visit (HOSPITAL_BASED_OUTPATIENT_CLINIC_OR_DEPARTMENT_OTHER): Payer: MEDICARE | Admitting: Clinical

## 2006-08-17 DIAGNOSIS — F331 Major depressive disorder, recurrent, moderate: Secondary | ICD-10-CM

## 2006-08-17 DIAGNOSIS — F449 Dissociative and conversion disorder, unspecified: Principal | ICD-10-CM

## 2006-08-24 DIAGNOSIS — F449 Dissociative and conversion disorder, unspecified: Secondary | ICD-10-CM | POA: Insufficient documentation

## 2006-08-24 DIAGNOSIS — F331 Major depressive disorder, recurrent, moderate: Secondary | ICD-10-CM | POA: Insufficient documentation

## 2006-08-24 NOTE — Progress Notes (Signed)
PSYCHIATRY OUTPATIENT PROGRESS NOTE    VISIT TYPE: Individual therapy     INTERPRETER: No interpreter needed.    PROBLEMS which this visit addressed:   Problem 1: depression    Problem 2: dissociation     Problem 3: social isolation        SOURCE(S) OF INFORMATION: Patient     SUBJECTIVE FINDINGS: "I'm glad to be finally working with someone. My dissociation has been a problem for a long time."     OBJECTIVE FINDINGS:   Pertinent positive and negative parts of mental status exam: see below.     Signs and symptoms: Depressed mood, intermittment dissociative symptoms. No active suicidality. No evidence of psychotic process.     Current medications (n/a for psychotherapy only visits): N/A     Medications taken as prescribed (n/a for psychotherapy only visits): N/A    Medication side effects - including movement disorders/AIMS score (n/a for psychotherapy only visits): N/A     Testing results: No test results pending.     Risk behaviors: None reported.       ASSESSMENT:  Clinical formulation: Patient coming to [facility] from private practice work seeking to better understand her long standing depressive and dissociative symptoms. Is on disability, has stable living situation.     Clinical interventions today and patient's response: Patient amenable to initial meeting.    Dual diagnosis stage of change: No dual diagnosis    Medical necessity for today's visit: Decrease dissociative and depressive symptoms.    Risk level per scale:   Suicide: low (1)   Violence: low (1)   Addiction: low (1)    DIAGNOSES:  Axis I (primary): 300.15     Axis I (other): 296.32    Axis II: deferred     Axis III: chronic fatigue        Axis IV: Moderate      Axis V: 55      PLAN: Continue individual therapy.     Risk plan (for patients at moderate/high risk for suicide/violence/addiction): Patient not at moderate or high risk.    Next visit: patient to be seen in one week.     FOR PSYCHOPHARMACOLOGY VISITS ONLY  Pregnancy status:  N/A    Medication plan:   N/A     Medication education: N/A    Medical work-up plan/testing: N/A    Instructions to covering prescriber: N/A     Amount of time spent w/patient today: N/A     Biagio Borg, LICSW

## 2006-08-26 ENCOUNTER — Ambulatory Visit (HOSPITAL_BASED_OUTPATIENT_CLINIC_OR_DEPARTMENT_OTHER): Payer: MEDICARE | Admitting: Clinical

## 2006-08-26 DIAGNOSIS — F449 Dissociative and conversion disorder, unspecified: Principal | ICD-10-CM

## 2006-08-26 DIAGNOSIS — F331 Major depressive disorder, recurrent, moderate: Secondary | ICD-10-CM

## 2006-08-26 NOTE — Progress Notes (Signed)
PSYCHIATRY OUTPATIENT PROGRESS NOTE      VISIT TYPE: Individual therapy      INTERPRETER: No interpreter needed.      PROBLEMS which this visit addressed:   Problem 1: depression      Problem 2: dissociation      Problem 3: social isolation         SOURCE(S) OF INFORMATION: Patient      SUBJECTIVE FINDINGS: "It feels good to be asked that and to be thinking about when my dad was jovial."      OBJECTIVE FINDINGS:   Pertinent positive and negative parts of mental status exam: see below.      Signs and symptoms: Moderately depressed. No active SI/HI.     Current medications (n/a for psychotherapy only visits): N/A      Medications taken as prescribed (n/a for psychotherapy only visits): N/A      Medication side effects - including movement disorders/AIMS score (n/a for psychotherapy only visits): N/A      Testing results: No test results pending.      Risk behaviors: None reported.         ASSESSMENT:   Clinical formulation: Moved by exploring remembrance of early years with father.      Clinical interventions today and patient's response: Exploration and support. Patient positive and affectively present.     Dual diagnosis stage of change: No dual diagnosis      Medical necessity for today's visit: Decrease dissociative and depressive symptoms.      Risk level per scale:    Suicide: low (1)    Violence: low (1)    Addiction: low (1)      DIAGNOSES:   Axis I (primary): 300.15      Axis I (other): 296.32     Axis II: deferred      Axis III: chronic fatigue         Axis IV: Moderate       Axis V: 55         PLAN: Continue individual therapy.      Risk plan (for patients at moderate/high risk for suicide/violence/addiction): Patient not at moderate or high risk.      Next visit: patient to be seen in two weeks d/2 holiday.     FOR PSYCHOPHARMACOLOGY VISITS ONLY   Pregnancy status: N/A      Medication plan:   N/A      Medication education: N/A      Medical work-up plan/testing: N/A      Instructions to covering prescriber:  N/A      Amount of time spent w/patient today: N/A      Biagio Borg, LICSW

## 2006-09-02 ENCOUNTER — Ambulatory Visit (HOSPITAL_BASED_OUTPATIENT_CLINIC_OR_DEPARTMENT_OTHER): Payer: MEDICARE | Admitting: Clinical

## 2006-09-02 DIAGNOSIS — F331 Major depressive disorder, recurrent, moderate: Secondary | ICD-10-CM

## 2006-09-02 DIAGNOSIS — F449 Dissociative and conversion disorder, unspecified: Secondary | ICD-10-CM
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
## 2006-09-07 ENCOUNTER — Ambulatory Visit (HOSPITAL_BASED_OUTPATIENT_CLINIC_OR_DEPARTMENT_OTHER): Payer: MEDICARE | Admitting: Clinical

## 2006-09-07 DIAGNOSIS — F449 Dissociative and conversion disorder, unspecified: Secondary | ICD-10-CM

## 2006-09-07 DIAGNOSIS — F331 Major depressive disorder, recurrent, moderate: Secondary | ICD-10-CM

## 2006-09-07 NOTE — Progress Notes (Signed)
PSYCHIATRY OUTPATIENT PROGRESS NOTE      VISIT TYPE: Individual therapy      INTERPRETER: No interpreter needed.      PROBLEMS which this visit addressed:   Problem 1: depression      Problem 2: dissociation      Problem 3: social isolation         SOURCE(S) OF INFORMATION: Patient      SUBJECTIVE FINDINGS: "I need to know we have a framework."      OBJECTIVE FINDINGS:   Pertinent positive and negative parts of mental status exam: see below.      Signs and symptoms: Moderately depressed. No active SI/HI.      Current medications (n/a for psychotherapy only visits): N/A      Medications taken as prescribed (n/a for psychotherapy only visits): N/A      Medication side effects - including movement disorders/AIMS score (n/a for psychotherapy only visits): N/A      Testing results: No test results pending.      Risk behaviors: None reported.         ASSESSMENT:   Clinical formulation: Fearing that her therapy will be uncontained as the past treatments have been.     Clinical interventions today and patient's response: Exploration and support.      Dual diagnosis stage of change: No dual diagnosis      Medical necessity for today's visit: Decrease dissociative and depressive symptoms.      Risk level per scale:    Suicide: low (1)    Violence: low (1)    Addiction: low (1)      DIAGNOSES:   Axis I (primary): 300.15      Axis I (other): 296.32     Axis II: deferred      Axis III: chronic fatigue         Axis IV: Moderate       Axis V: 55         PLAN: Continue individual therapy.      Risk plan (for patients at moderate/high risk for suicide/violence/addiction): Patient not at moderate or high risk.      Next visit: 09/14/06.    FOR PSYCHOPHARMACOLOGY VISITS ONLY   Pregnancy status: N/A      Medication plan:   N/A      Medication education: N/A      Medical work-up plan/testing: N/A      Instructions to covering prescriber: N/A      Amount of time spent w/patient today: N/A      Biagio Borg, LICSW

## 2006-09-14 ENCOUNTER — Ambulatory Visit (HOSPITAL_BASED_OUTPATIENT_CLINIC_OR_DEPARTMENT_OTHER): Payer: MEDICARE | Admitting: Clinical

## 2006-09-14 DIAGNOSIS — F449 Dissociative and conversion disorder, unspecified: Secondary | ICD-10-CM

## 2006-09-14 NOTE — Progress Notes (Signed)
PSYCHIATRY OUTPATIENT PROGRESS NOTE      VISIT TYPE: Individual therapy      INTERPRETER: No interpreter needed.      PROBLEMS which this visit addressed:   Problem 1: depression      Problem 2: dissociation      Problem 3: social isolation         SOURCE(S) OF INFORMATION: Patient      SUBJECTIVE FINDINGS: "It's hard for me when you're late for session."     OBJECTIVE FINDINGS:   Pertinent positive and negative parts of mental status exam: see below.      Signs and symptoms: Moderately depressed. No active SI/HI.      Current medications (n/a for psychotherapy only visits): N/A      Medications taken as prescribed (n/a for psychotherapy only visits): N/A      Medication side effects - including movement disorders/AIMS score (n/a for psychotherapy only visits): N/A      Testing results: No test results pending.      Risk behaviors: None reported.         ASSESSMENT:   Clinical formulation: Processing apprehensions about treatment relationship.      Clinical interventions today and patient's response: Exploration and support.      Dual diagnosis stage of change: No dual diagnosis      Medical necessity for today's visit: Decrease dissociative and depressive symptoms.      Risk level per scale:    Suicide: low (1)    Violence: low (1)    Addiction: low (1)      DIAGNOSES:   Axis I (primary): 300.15      Axis I (other): 296.32     Axis II: deferred      Axis III: chronic fatigue         Axis IV: Moderate       Axis V: 55         PLAN: Continue individual therapy.      Risk plan (for patients at moderate/high risk for suicide/violence/addiction): Patient not at moderate or high risk.      Next visit: 09/21/06.      FOR PSYCHOPHARMACOLOGY VISITS ONLY   Pregnancy status: N/A      Medication plan:   N/A      Medication education: N/A      Medical work-up plan/testing: N/A      Instructions to covering prescriber: N/A      Amount of time spent w/patient today: N/A      Biagio Borg, LICSW

## 2006-09-21 ENCOUNTER — Ambulatory Visit (HOSPITAL_BASED_OUTPATIENT_CLINIC_OR_DEPARTMENT_OTHER): Payer: MEDICARE | Admitting: Clinical

## 2006-09-21 DIAGNOSIS — F449 Dissociative and conversion disorder, unspecified: Principal | ICD-10-CM

## 2006-09-21 NOTE — Progress Notes (Signed)
PSYCHIATRY OUTPATIENT PROGRESS NOTE      VISIT TYPE: Individual therapy      INTERPRETER: No interpreter needed.      PROBLEMS which this visit addressed:   Problem 1: depression      Problem 2: dissociation      Problem 3: social isolation         SOURCE(S) OF INFORMATION: Patient      SUBJECTIVE FINDINGS: "I feel like others sense my need and they go away."     OBJECTIVE FINDINGS:   Pertinent positive and negative parts of mental status exam: see below.      Signs and symptoms: Moderately depressed. No active SI/HI.      Current medications (n/a for psychotherapy only visits): N/A      Medications taken as prescribed (n/a for psychotherapy only visits): N/A      Medication side effects - including movement disorders/AIMS score (n/a for psychotherapy only visits): N/A      Testing results: No test results pending.      Risk behaviors: None reported.         ASSESSMENT:   Clinical formulation: Looking at ways she can better care and sooth herself; noticing the effect her anxiety and sadness has on others.     Clinical interventions today and patient's response: Exploration and support.      Dual diagnosis stage of change: No dual diagnosis      Medical necessity for today's visit: Decrease dissociative and depressive symptoms.      Risk level per scale:    Suicide: low (1)    Violence: low (1)    Addiction: low (1)      DIAGNOSES:   Axis I (primary): 300.15      Axis I (other): 296.32     Axis II: deferred      Axis III: chronic fatigue         Axis IV: Moderate       Axis V: 55         PLAN: Continue individual therapy.      Risk plan (for patients at moderate/high risk for suicide/violence/addiction): Patient not at moderate or high risk.      Next visit: 10/08/06.      FOR PSYCHOPHARMACOLOGY VISITS ONLY   Pregnancy status: N/A      Medication plan:   N/A      Medication education: N/A      Medical work-up plan/testing: N/A      Instructions to covering prescriber: N/A      Amount of time spent w/patient today: N/A       Biagio Borg, LICSW

## 2006-09-22 ENCOUNTER — Ambulatory Visit (HOSPITAL_BASED_OUTPATIENT_CLINIC_OR_DEPARTMENT_OTHER): Payer: MEDICARE | Admitting: Internal Medicine

## 2006-09-22 VITALS — BP 124/84 | Wt 120.0 lb

## 2006-09-22 DIAGNOSIS — H524 Presbyopia: Secondary | ICD-10-CM

## 2006-09-22 DIAGNOSIS — R5383 Other fatigue: Principal | ICD-10-CM

## 2006-09-22 DIAGNOSIS — R5381 Other malaise: Secondary | ICD-10-CM

## 2006-09-22 LAB — BLOOD COUNT COMPLETE AUTOMATED
HEMATOCRIT: 38.8 % (ref 36.0–48.0)
HEMOGLOBIN: 13 g/dl (ref 12.0–16.0)
MEAN CORP HGB CONC: 33.6 g/dl (ref 32.0–36.0)
MEAN CORPUSCULAR HGB: 31.6 pg (ref 27.0–33.0)
MEAN CORPUSCULAR VOL: 94 fl (ref 80.0–100.0)
MEAN PLATELET VOLUME: 9.1 fl (ref 6.4–10.8)
PLATELET COUNT: 258 10*3/uL (ref 150–400)
RBC DISTRIBUTION WIDTH: 12.3 % (ref 11.5–14.3)
RED BLOOD CELL COUNT: 4.13 M/uL — ABNORMAL LOW (ref 4.50–5.10)
WHITE BLOOD CELL COUNT: 5.9 10*3/uL (ref 4.0–10.8)

## 2006-09-22 LAB — CHG ASSAY OF FERRITIN: FERRITIN: 25 ng/mL (ref 11–307)

## 2006-09-22 NOTE — Progress Notes (Signed)
Brianna [name] is a 51 year old w  Here re: f/u, GI, anemia: seen by Brianna [name] 02/24/06, egd/cscopy 09/09/06: told all nl.  Also, records recently rcvd, Brianna [name], '03. Nothing of signif.  GI: ROS: flatulence.   ROS generally: energy a bit low, sleep often disrupted.  Psych: seeing provider at [facility].    OBJECTIVE:   BP 124/84  Wt 120 lbs (54.4kg)   Looks generally at baseline.   Exam not indicated.     ASSESSMENT/PLAN:   Anemia: rpt  Rhm - crc screening done - rvwd  Schedule cpe - 4 months  Ptsd - in tx - discussed  Counselling-oriented visit, 15 minutes spent face-to-face with patient, of which greater than 50% was direct counselling; topics covered included ptsd, fatigue, anemia, crc screening

## 2006-09-23 ENCOUNTER — Ambulatory Visit (HOSPITAL_BASED_OUTPATIENT_CLINIC_OR_DEPARTMENT_OTHER): Payer: MEDICARE | Admitting: Psychologist

## 2006-09-23 ENCOUNTER — Encounter (HOSPITAL_BASED_OUTPATIENT_CLINIC_OR_DEPARTMENT_OTHER): Payer: Self-pay | Admitting: Internal Medicine

## 2006-09-30 ENCOUNTER — Ambulatory Visit (HOSPITAL_BASED_OUTPATIENT_CLINIC_OR_DEPARTMENT_OTHER): Payer: MEDICARE | Admitting: Psychologist

## 2006-09-30 NOTE — Progress Notes (Signed)
OUTPATIENT PSYCHIATRY GROUP PROGRESS NOTE    Patient Name: Brianna [name]    Group Name: Trauma and the Body    Leaders: Delbert Harness and Jayme Shorin     Service Type: [id number] Group Psychotherapy     Length of Group: 90 minutes           Purpose of Group (choose all that apply):     Symptom relief  Affect regulation  Illness management  Skills training/rehab  Interpersonal skill development  Support (psychological, family, community resources)  Insight and behavior change           Group Process: Group members engaged in topic - supporting one another. Topics included centering and self-care. Activity of meditation walking.    Individual Patient Participation:     Had difficulty tolerating group  Minimal participation  Quiet, but attentive to process      Diagnosis (addressed by this group): PTSD    Medical Necessity of Session (how treatment is necessary to improve symptoms, functioning, or prevent worsening): improve physical and psychological functioning.    Relevant Changes in Mental Status: No.     Risk Level per Scale:  Suicide: low  Violence: low  Addiction: low    Current risk level represents increase in risk: No.       Meghanne Pletz Bolduc-Hicks 09/30/2006

## 2006-10-05 ENCOUNTER — Ambulatory Visit (HOSPITAL_BASED_OUTPATIENT_CLINIC_OR_DEPARTMENT_OTHER): Payer: MEDICARE | Admitting: Clinical

## 2006-10-07 ENCOUNTER — Ambulatory Visit (HOSPITAL_BASED_OUTPATIENT_CLINIC_OR_DEPARTMENT_OTHER): Payer: MEDICARE | Admitting: Psychologist

## 2006-10-07 ENCOUNTER — Telehealth (HOSPITAL_BASED_OUTPATIENT_CLINIC_OR_DEPARTMENT_OTHER): Payer: Self-pay | Admitting: Psychologist

## 2006-10-08 ENCOUNTER — Ambulatory Visit (HOSPITAL_BASED_OUTPATIENT_CLINIC_OR_DEPARTMENT_OTHER): Payer: MEDICARE | Admitting: Clinical

## 2006-10-08 DIAGNOSIS — F449 Dissociative and conversion disorder, unspecified: Secondary | ICD-10-CM

## 2006-10-08 DIAGNOSIS — F4310 Post-traumatic stress disorder, unspecified: Secondary | ICD-10-CM

## 2006-10-08 NOTE — Progress Notes (Signed)
PSYCHIATRY OUTPATIENT PROGRESS NOTE      VISIT TYPE: Individual therapy      INTERPRETER: No interpreter needed.      PROBLEMS which this visit addressed:   Problem 1: depression      Problem 2: dissociation      Problem 3: social isolation         SOURCE(S) OF INFORMATION: Patient      SUBJECTIVE FINDINGS: "I don't feel like you're hearing me."     OBJECTIVE FINDINGS:   Pertinent positive and negative parts of mental status exam: see below.      Signs and symptoms: Moderately depressed. No active SI/HI.      Current medications (n/a for psychotherapy only visits): N/A      Medications taken as prescribed (n/a for psychotherapy only visits): N/A      Medication side effects - including movement disorders/AIMS score (n/a for psychotherapy only visits): N/A      Testing results: No test results pending.      Risk behaviors: None reported.         ASSESSMENT:   Clinical formulation: Illustrating her relational anxiety in sensitivity to therapist's responses.     Clinical interventions today and patient's response: Exploration and support.      Dual diagnosis stage of change: No dual diagnosis      Medical necessity for today's visit: Decrease dissociative and depressive symptoms.      Risk level per scale:    Suicide: low (1)    Violence: low (1)    Addiction: low (1)      DIAGNOSES:   Axis I (primary): 300.15      Axis I (other): 296.32     Axis II: deferred      Axis III: chronic fatigue         Axis IV: Moderate       Axis V: 55         PLAN: Continue individual therapy.      Risk plan (for patients at moderate/high risk for suicide/violence/addiction): Patient not at moderate or high risk.      Next visit: 10/12/06     FOR PSYCHOPHARMACOLOGY VISITS ONLY   Pregnancy status: N/A      Medication plan:   N/A      Medication education: N/A      Medical work-up plan/testing: N/A      Instructions to covering prescriber: N/A      Amount of time spent w/patient today: N/A      Biagio Borg, LICSW

## 2006-10-12 ENCOUNTER — Ambulatory Visit (HOSPITAL_BASED_OUTPATIENT_CLINIC_OR_DEPARTMENT_OTHER): Payer: MEDICARE | Admitting: Clinical

## 2006-10-12 DIAGNOSIS — F331 Major depressive disorder, recurrent, moderate: Secondary | ICD-10-CM

## 2006-10-12 NOTE — Progress Notes (Signed)
PSYCHIATRY OUTPATIENT PROGRESS NOTE      VISIT TYPE: Individual therapy      INTERPRETER: No interpreter needed.      PROBLEMS which this visit addressed:   Problem 1: depression      Problem 2: dissociation      Problem 3: social isolation         SOURCE(S) OF INFORMATION: Patient      SUBJECTIVE FINDINGS: Spoke about depressive illness of both parents.      OBJECTIVE FINDINGS:   Pertinent positive and negative parts of mental status exam: see below.      Signs and symptoms: Moderately depressed. No active SI/HI.      Current medications (n/a for psychotherapy only visits): N/A      Medications taken as prescribed (n/a for psychotherapy only visits): N/A      Medication side effects - including movement disorders/AIMS score (n/a for psychotherapy only visits): N/A      Testing results: No test results pending.      Risk behaviors: None reported.         ASSESSMENT:   Clinical formulation: Exploring impact of parents' psychological issues on her childhood experience.     Clinical interventions today and patient's response: Exploration and support.      Dual diagnosis stage of change: No dual diagnosis      Medical necessity for today's visit: Decrease dissociative and depressive symptoms.      Risk level per scale:    Suicide: low (1)    Violence: low (1)    Addiction: low (1)      DIAGNOSES:   Axis I (primary): 300.15      Axis I (other): 296.32     Axis II: deferred      Axis III: chronic fatigue         Axis IV: Moderate       Axis V: 55         PLAN: Continue individual therapy.      Risk plan (for patients at moderate/high risk for suicide/violence/addiction): Patient not at moderate or high risk.      Next visit: 10/19/06      FOR PSYCHOPHARMACOLOGY VISITS ONLY   Pregnancy status: N/A      Medication plan:   N/A      Medication education: N/A      Medical work-up plan/testing: N/A      Instructions to covering prescriber: N/A      Amount of time spent w/patient today: N/A      Biagio Borg, LICSW

## 2006-10-14 ENCOUNTER — Ambulatory Visit (HOSPITAL_BASED_OUTPATIENT_CLINIC_OR_DEPARTMENT_OTHER): Payer: MEDICARE | Admitting: Psychologist

## 2006-10-15 ENCOUNTER — Ambulatory Visit (HOSPITAL_BASED_OUTPATIENT_CLINIC_OR_DEPARTMENT_OTHER): Payer: Self-pay

## 2006-10-19 ENCOUNTER — Ambulatory Visit (HOSPITAL_BASED_OUTPATIENT_CLINIC_OR_DEPARTMENT_OTHER): Payer: MEDICARE | Admitting: Clinical

## 2006-10-19 DIAGNOSIS — F331 Major depressive disorder, recurrent, moderate: Secondary | ICD-10-CM
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
## 2006-10-21 ENCOUNTER — Ambulatory Visit (HOSPITAL_BASED_OUTPATIENT_CLINIC_OR_DEPARTMENT_OTHER): Payer: MEDICARE | Admitting: Psychologist

## 2006-10-22 ENCOUNTER — Other Ambulatory Visit: Payer: MEDICARE | Admitting: Ophthalmology

## 2006-10-22 DIAGNOSIS — H01009 Unspecified blepharitis unspecified eye, unspecified eyelid: Principal | ICD-10-CM

## 2006-10-22 DIAGNOSIS — H524 Presbyopia: Secondary | ICD-10-CM

## 2006-10-22 DIAGNOSIS — H5210 Myopia, unspecified eye: Secondary | ICD-10-CM

## 2006-10-26 ENCOUNTER — Ambulatory Visit (HOSPITAL_BASED_OUTPATIENT_CLINIC_OR_DEPARTMENT_OTHER): Payer: MEDICARE | Admitting: Clinical

## 2006-10-26 DIAGNOSIS — F449 Dissociative and conversion disorder, unspecified: Secondary | ICD-10-CM

## 2006-10-26 NOTE — Progress Notes (Signed)
PSYCHIATRY OUTPATIENT PROGRESS NOTE      VISIT TYPE: Individual therapy      INTERPRETER: No interpreter needed.      PROBLEMS which this visit addressed:   Problem 1: depression      Problem 2: dissociation      Problem 3: social isolation         SOURCE(S) OF INFORMATION: Patient      SUBJECTIVE FINDINGS: "A sad part of me comes out when I do this."     OBJECTIVE FINDINGS:   Pertinent positive and negative parts of mental status exam: see below.      Signs and symptoms: Moderately depressed. No active SI/HI.      Current medications (n/a for psychotherapy only visits): N/A      Medications taken as prescribed (n/a for psychotherapy only visits): N/A      Medication side effects - including movement disorders/AIMS score (n/a for psychotherapy only visits): N/A      Testing results: No test results pending.      Risk behaviors: None reported.         ASSESSMENT:   Clinical formulation: Exploring different parts of self with safe place imagery.      Clinical interventions today and patient's response: Exploration and support.      Dual diagnosis stage of change: No dual diagnosis      Medical necessity for today's visit: Decrease dissociative and depressive symptoms.      Risk level per scale:    Suicide: low (1)    Violence: low (1)    Addiction: low (1)      DIAGNOSES:   Axis I (primary): 300.15      Axis I (other): 296.32     Axis II: deferred      Axis III: chronic fatigue         Axis IV: Moderate       Axis V: 55         PLAN: Continue individual therapy.      Risk plan (for patients at moderate/high risk for suicide/violence/addiction): Patient not at moderate or high risk.      Next visit: 11/02/06     FOR PSYCHOPHARMACOLOGY VISITS ONLY   Pregnancy status: N/A      Medication plan:   N/A      Medication education: N/A      Medical work-up plan/testing: N/A      Instructions to covering prescriber: N/A      Amount of time spent w/patient today: N/A      Biagio Borg, LICSW

## 2006-10-28 ENCOUNTER — Ambulatory Visit (HOSPITAL_BASED_OUTPATIENT_CLINIC_OR_DEPARTMENT_OTHER): Payer: MEDICARE | Admitting: Psychologist

## 2006-11-02 ENCOUNTER — Ambulatory Visit (HOSPITAL_BASED_OUTPATIENT_CLINIC_OR_DEPARTMENT_OTHER): Payer: MEDICARE | Admitting: Clinical

## 2006-11-02 DIAGNOSIS — F331 Major depressive disorder, recurrent, moderate: Secondary | ICD-10-CM

## 2006-11-04 ENCOUNTER — Ambulatory Visit (HOSPITAL_BASED_OUTPATIENT_CLINIC_OR_DEPARTMENT_OTHER): Payer: MEDICARE | Admitting: Psychologist

## 2006-11-05 DIAGNOSIS — F331 Major depressive disorder, recurrent, moderate: Principal | ICD-10-CM

## 2006-11-05 NOTE — Progress Notes (Signed)
PSYCHIATRY OUTPATIENT PROGRESS NOTE      VISIT TYPE: Individual therapy      INTERPRETER: No interpreter needed.      PROBLEMS which this visit addressed:   Problem 1: depression      Problem 2: dissociation      Problem 3: social isolation         SOURCE(S) OF INFORMATION: Patient      SUBJECTIVE FINDINGS: "This is hard for me."     OBJECTIVE FINDINGS:   Pertinent positive and negative parts of mental status exam: see below.      Signs and symptoms: Moderately depressed. No active SI/HI.      Current medications (n/a for psychotherapy only visits): N/A      Medications taken as prescribed (n/a for psychotherapy only visits): N/A      Medication side effects - including movement disorders/AIMS score (n/a for psychotherapy only visits): N/A      Testing results: No test results pending.      Risk behaviors: None reported.         ASSESSMENT:   Clinical formulation: Establishing safety in treatment relationship.     Clinical interventions today and patient's response: Exploration and support.      Dual diagnosis stage of change: No dual diagnosis      Medical necessity for today's visit: Decrease dissociative and depressive symptoms.      Risk level per scale:    Suicide: low (1)    Violence: low (1)    Addiction: low (1)      DIAGNOSES:   Axis I (primary): 300.15      Axis I (other): 296.32     Axis II: deferred      Axis III: chronic fatigue         Axis IV: Moderate       Axis V: 55         PLAN: Continue individual therapy.      Risk plan (for patients at moderate/high risk for suicide/violence/addiction): Patient not at moderate or high risk.      Next visit: 11/09/06     FOR PSYCHOPHARMACOLOGY VISITS ONLY   Pregnancy status: N/A      Medication plan:   N/A      Medication education: N/A      Medical work-up plan/testing: N/A      Instructions to covering prescriber: N/A      Amount of time spent w/patient today: N/A      Biagio Borg, LICSW

## 2006-11-09 ENCOUNTER — Ambulatory Visit (HOSPITAL_BASED_OUTPATIENT_CLINIC_OR_DEPARTMENT_OTHER): Payer: MEDICARE | Admitting: Clinical

## 2006-11-09 DIAGNOSIS — F331 Major depressive disorder, recurrent, moderate: Secondary | ICD-10-CM

## 2006-11-09 NOTE — Progress Notes (Signed)
PSYCHIATRY OUTPATIENT PROGRESS NOTE      VISIT TYPE: Individual therapy      INTERPRETER: No interpreter needed.      PROBLEMS which this visit addressed:   Problem 1: depression      Problem 2: dissociation      Problem 3: social isolation         SOURCE(S) OF INFORMATION: Patient      SUBJECTIVE FINDINGS: Patient spoke about contentious relationship with sister.     OBJECTIVE FINDINGS:   Pertinent positive and negative parts of mental status exam: see below.      Signs and symptoms: Moderately depressed. No active SI/HI.      Current medications (n/a for psychotherapy only visits): N/A      Medications taken as prescribed (n/a for psychotherapy only visits): N/A      Medication side effects - including movement disorders/AIMS score (n/a for psychotherapy only visits): N/A      Testing results: No test results pending.      Risk behaviors: None reported.         ASSESSMENT:   Clinical formulation: Exploring antecedents of difficulty with assertiveness.     Clinical interventions today and patient's response: Exploration and support.      Dual diagnosis stage of change: No dual diagnosis      Medical necessity for today's visit: Decrease dissociative and depressive symptoms.      Risk level per scale:    Suicide: low (1)    Violence: low (1)    Addiction: low (1)      DIAGNOSES:   Axis I (primary): 300.15      Axis I (other): 296.32     Axis II: deferred      Axis III: chronic fatigue         Axis IV: Moderate       Axis V: 55         PLAN: Continue individual therapy.      Risk plan (for patients at moderate/high risk for suicide/violence/addiction): Patient not at moderate or high risk.      Next visit: 11/16/06     FOR PSYCHOPHARMACOLOGY VISITS ONLY   Pregnancy status: N/A      Medication plan:   N/A      Medication education: N/A      Medical work-up plan/testing: N/A      Instructions to covering prescriber: N/A      Amount of time spent w/patient today: N/A      Biagio Borg, LICSW

## 2006-11-11 ENCOUNTER — Ambulatory Visit (HOSPITAL_BASED_OUTPATIENT_CLINIC_OR_DEPARTMENT_OTHER): Payer: MEDICARE | Admitting: Psychologist

## 2006-11-16 ENCOUNTER — Ambulatory Visit (HOSPITAL_BASED_OUTPATIENT_CLINIC_OR_DEPARTMENT_OTHER): Payer: MEDICARE | Admitting: Clinical

## 2006-11-16 DIAGNOSIS — F4310 Post-traumatic stress disorder, unspecified: Secondary | ICD-10-CM

## 2006-11-16 NOTE — Progress Notes (Signed)
PSYCHIATRY OUTPATIENT PROGRESS NOTE      VISIT TYPE: Individual therapy      INTERPRETER: No interpreter needed.      PROBLEMS which this visit addressed:   Problem 1: depression      Problem 2: dissociation      Problem 3: social isolation         SOURCE(S) OF INFORMATION: Patient      SUBJECTIVE FINDINGS: "It helps me to focus on our relationship."     OBJECTIVE FINDINGS:   Pertinent positive and negative parts of mental status exam: see below.      Signs and symptoms: Moderately depressed. No active SI/HI.      Current medications (n/a for psychotherapy only visits): N/A      Medications taken as prescribed (n/a for psychotherapy only visits): N/A      Medication side effects - including movement disorders/AIMS score (n/a for psychotherapy only visits): N/A      Testing results: No test results pending.      Risk behaviors: None reported.         ASSESSMENT:   Clinical formulation: Looking at treatment relationship and its connection to patient's struggles in outside world.     Clinical interventions today and patient's response: Exploration and support.      Dual diagnosis stage of change: No dual diagnosis      Medical necessity for today's visit: Decrease dissociative and depressive symptoms.      Risk level per scale:    Suicide: low (1)    Violence: low (1)    Addiction: low (1)      DIAGNOSES:   Axis I (primary): 300.15      Axis I (other): 296.32     Axis II: deferred      Axis III: chronic fatigue         Axis IV: Moderate       Axis V: 55         PLAN: Continue individual therapy.      Risk plan (for patients at moderate/high risk for suicide/violence/addiction): Patient not at moderate or high risk.      Next visit: 11/23/06     FOR PSYCHOPHARMACOLOGY VISITS ONLY   Pregnancy status: N/A      Medication plan:   N/A      Medication education: N/A      Medical work-up plan/testing: N/A      Instructions to covering prescriber: N/A      Amount of time spent w/patient today: N/A      Biagio Borg,  LICSW

## 2006-11-18 ENCOUNTER — Ambulatory Visit (HOSPITAL_BASED_OUTPATIENT_CLINIC_OR_DEPARTMENT_OTHER): Payer: MEDICARE | Admitting: Psychologist

## 2006-11-20 ENCOUNTER — Ambulatory Visit: Payer: Self-pay | Admitting: Optometry

## 2006-11-20 ENCOUNTER — Encounter (HOSPITAL_BASED_OUTPATIENT_CLINIC_OR_DEPARTMENT_OTHER): Payer: Self-pay

## 2006-11-20 DIAGNOSIS — H5210 Myopia, unspecified eye: Principal | ICD-10-CM

## 2006-11-23 ENCOUNTER — Ambulatory Visit (HOSPITAL_BASED_OUTPATIENT_CLINIC_OR_DEPARTMENT_OTHER): Payer: MEDICARE | Admitting: Clinical

## 2006-11-23 DIAGNOSIS — F4310 Post-traumatic stress disorder, unspecified: Secondary | ICD-10-CM

## 2006-11-23 NOTE — Progress Notes (Signed)
PSYCHIATRY OUTPATIENT PROGRESS NOTE      VISIT TYPE: Individual therapy      INTERPRETER: No interpreter needed.      PROBLEMS which this visit addressed:   Problem 1: depression      Problem 2: dissociation      Problem 3: social isolation         SOURCE(S) OF INFORMATION: Patient      SUBJECTIVE FINDINGS: "I need to talk about my feelings in the relationship."     OBJECTIVE FINDINGS:   Pertinent positive and negative parts of mental status exam: see below.      Signs and symptoms: Moderately depressed. No active SI/HI.      Current medications (n/a for psychotherapy only visits): N/A      Medications taken as prescribed (n/a for psychotherapy only visits): N/A      Medication side effects - including movement disorders/AIMS score (n/a for psychotherapy only visits): N/A      Testing results: No test results pending.      Risk behaviors: None reported.         ASSESSMENT:   Clinical formulation: Continuing to think about the legacy of past treatments on current relationship.     Clinical interventions today and patient's response: Exploration and support.      Dual diagnosis stage of change: No dual diagnosis      Medical necessity for today's visit: Decrease dissociative and depressive symptoms.      Risk level per scale:    Suicide: low (1)    Violence: low (1)    Addiction: low (1)      DIAGNOSES:   Axis I (primary): 300.15      Axis I (other): 296.32     Axis II: deferred      Axis III: chronic fatigue         Axis IV: Moderate       Axis V: 55         PLAN: Continue individual therapy.      Risk plan (for patients at moderate/high risk for suicide/violence/addiction): Patient not at moderate or high risk.      Next visit: 12/09/06     FOR PSYCHOPHARMACOLOGY VISITS ONLY   Pregnancy status: N/A      Medication plan:   N/A      Medication education: N/A      Medical work-up plan/testing: N/A      Instructions to covering prescriber: N/A      Amount of time spent w/patient today: N/A      Biagio Borg, LICSW

## 2006-11-24 ENCOUNTER — Ambulatory Visit: Payer: Self-pay | Admitting: Optometry

## 2006-11-24 DIAGNOSIS — H5210 Myopia, unspecified eye: Principal | ICD-10-CM

## 2006-11-25 ENCOUNTER — Ambulatory Visit (HOSPITAL_BASED_OUTPATIENT_CLINIC_OR_DEPARTMENT_OTHER): Payer: MEDICARE | Admitting: Psychologist

## 2006-12-02 ENCOUNTER — Ambulatory Visit (HOSPITAL_BASED_OUTPATIENT_CLINIC_OR_DEPARTMENT_OTHER): Payer: MEDICARE | Admitting: Psychologist

## 2006-12-07 ENCOUNTER — Ambulatory Visit (HOSPITAL_BASED_OUTPATIENT_CLINIC_OR_DEPARTMENT_OTHER): Payer: MEDICARE | Admitting: Clinical

## 2006-12-09 ENCOUNTER — Ambulatory Visit (HOSPITAL_BASED_OUTPATIENT_CLINIC_OR_DEPARTMENT_OTHER): Payer: MEDICARE | Admitting: Clinical

## 2006-12-09 ENCOUNTER — Ambulatory Visit (HOSPITAL_BASED_OUTPATIENT_CLINIC_OR_DEPARTMENT_OTHER): Payer: MEDICARE | Admitting: Psychologist

## 2006-12-09 DIAGNOSIS — F4310 Post-traumatic stress disorder, unspecified: Principal | ICD-10-CM

## 2006-12-14 ENCOUNTER — Ambulatory Visit (HOSPITAL_BASED_OUTPATIENT_CLINIC_OR_DEPARTMENT_OTHER): Payer: MEDICARE | Admitting: Clinical

## 2006-12-14 DIAGNOSIS — F449 Dissociative and conversion disorder, unspecified: Principal | ICD-10-CM

## 2006-12-14 NOTE — Progress Notes (Signed)
PSYCHIATRY OUTPATIENT PROGRESS NOTE      VISIT TYPE: Individual therapy      INTERPRETER: No interpreter needed.      PROBLEMS which this visit addressed:   Problem 1: depression      Problem 2: dissociation      Problem 3: social isolation         SOURCE(S) OF INFORMATION: Patient      SUBJECTIVE FINDINGS: "I wish you could say it the way I want."     OBJECTIVE FINDINGS:   Pertinent positive and negative parts of mental status exam: see below.      Signs and symptoms: Moderately depressed. No active SI/HI.      Current medications (n/a for psychotherapy only visits): N/A      Medications taken as prescribed (n/a for psychotherapy only visits): N/A      Medication side effects - including movement disorders/AIMS score (n/a for psychotherapy only visits): N/A      Testing results: No test results pending.      Risk behaviors: None reported.         ASSESSMENT:   Clinical formulation: Struggling with frustration tolerance in treatment.     Clinical interventions today and patient's response: Exploration and support.      Dual diagnosis stage of change: No dual diagnosis      Medical necessity for today's visit: Decrease dissociative and depressive symptoms.      Risk level per scale:    Suicide: low (1)    Violence: low (1)    Addiction: low (1)      DIAGNOSES:   Axis I (primary): 300.15      Axis I (other): 296.32     Axis II: deferred      Axis III: chronic fatigue         Axis IV: Moderate       Axis V: 55         PLAN: Continue individual therapy.      Risk plan (for patients at moderate/high risk for suicide/violence/addiction): Patient not at moderate or high risk.      Next visit: 12/14/06     FOR PSYCHOPHARMACOLOGY VISITS ONLY   Pregnancy status: N/A      Medication plan:   N/A      Medication education: N/A      Medical work-up plan/testing: N/A      Instructions to covering prescriber: N/A      Amount of time spent w/patient today: N/A      Biagio Borg, LICSW

## 2006-12-14 NOTE — Progress Notes (Signed)
PSYCHIATRY OUTPATIENT PROGRESS NOTE      VISIT TYPE: Individual therapy      INTERPRETER: No interpreter needed.      PROBLEMS which this visit addressed:   Problem 1: depression      Problem 2: dissociation      Problem 3: social isolation         SOURCE(S) OF INFORMATION: Patient      SUBJECTIVE FINDINGS: "I feel like this therapy is becoming like with Roman."     OBJECTIVE FINDINGS:   Pertinent positive and negative parts of mental status exam: see below.      Signs and symptoms: Moderately depressed. No active SI/HI.      Current medications (n/a for psychotherapy only visits): N/A      Medications taken as prescribed (n/a for psychotherapy only visits): N/A      Medication side effects - including movement disorders/AIMS score (n/a for psychotherapy only visits): N/A      Testing results: No test results pending.      Risk behaviors: None reported.         ASSESSMENT:   Clinical formulation: Acutely sensitive to possible disconnections in treatment relationship; craving a consistent mirroring response.     Clinical interventions today and patient's response: Exploration and support.      Dual diagnosis stage of change: No dual diagnosis      Medical necessity for today's visit: Decrease dissociative and depressive symptoms.      Risk level per scale:    Suicide: low (1)    Violence: low (1)    Addiction: low (1)      DIAGNOSES:   Axis I (primary): 300.15      Axis I (other): 296.32     Axis II: deferred      Axis III: chronic fatigue         Axis IV: Moderate       Axis V: 50-55         PLAN: Continue individual therapy.      Risk plan (for patients at moderate/high risk for suicide/violence/addiction): Patient not at moderate or high risk.      Next visit: 12/23/06     FOR PSYCHOPHARMACOLOGY VISITS ONLY   Pregnancy status: N/A      Medication plan:   N/A      Medication education: N/A      Medical work-up plan/testing: N/A      Instructions to covering prescriber: N/A      Amount of time spent w/patient  today: N/A      Biagio Borg, LICSW

## 2006-12-15 ENCOUNTER — Ambulatory Visit: Payer: MEDICARE | Admitting: Optometry

## 2006-12-15 DIAGNOSIS — H01009 Unspecified blepharitis unspecified eye, unspecified eyelid: Secondary | ICD-10-CM

## 2006-12-21 ENCOUNTER — Ambulatory Visit (HOSPITAL_BASED_OUTPATIENT_CLINIC_OR_DEPARTMENT_OTHER): Payer: MEDICARE | Admitting: Internal Medicine

## 2006-12-21 VITALS — BP 104/70 | Ht 63.0 in | Wt 119.0 lb

## 2006-12-21 DIAGNOSIS — F331 Major depressive disorder, recurrent, moderate: Secondary | ICD-10-CM

## 2006-12-21 DIAGNOSIS — Z124 Encounter for screening for malignant neoplasm of cervix: Secondary | ICD-10-CM

## 2006-12-21 DIAGNOSIS — N951 Menopausal and female climacteric states: Secondary | ICD-10-CM

## 2006-12-21 DIAGNOSIS — R5383 Other fatigue: Secondary | ICD-10-CM

## 2006-12-21 DIAGNOSIS — M21619 Bunion of unspecified foot: Secondary | ICD-10-CM

## 2006-12-21 DIAGNOSIS — R5381 Other malaise: Secondary | ICD-10-CM

## 2006-12-21 DIAGNOSIS — N952 Postmenopausal atrophic vaginitis: Secondary | ICD-10-CM

## 2006-12-21 DIAGNOSIS — Z803 Family history of malignant neoplasm of breast: Secondary | ICD-10-CM

## 2006-12-21 DIAGNOSIS — L659 Nonscarring hair loss, unspecified: Secondary | ICD-10-CM

## 2006-12-21 MED ORDER — ESTRACE 0.1 MG/GM VA CREA
TOPICAL_CREAM | VAGINAL | Status: DC
Start: 2006-12-21 — End: 2007-11-28

## 2006-12-21 NOTE — Progress Notes (Signed)
Brianna [name] is a 51 year old w  Here for annual exam.  Maybe moving from [facility] to a therapist who someone who does [facility] via hypnosis -   Moved; unpacking. This makes her more aware of her fatigue.  She has Q's about:  Hair - seems to be falling out - notes increased hair falling. Also, why did she go grey so rapidly - metabolic explanations? Also: Q's re: bunion.  ROS: No unusual headaches, no dysphagia. No cough. No dyspnea or chest pain on exertion. No abdominal pain, change in bowel habits. No urinary tract symptoms. No new or unusual musculoskeletal symptoms. She is post menopausal and reports no vaginal bleeding, discharge or pelvic pain. Does have some hot flashes. She is not sexually active. No breast lumps, breast pain or nipple discharge.     OBJECTIVE: BP 104/70  Ht 5\' 3"  (1.6 m)  Wt 119 lb (53.978 kg)    Physical exam: she appears well, alert and appropriate. HEENT: scalp, hair exam nl. Neck supple, without adenopathy or masses. No thyromegaly. Chest is clear to auscultation. Heart sounds: normal S1 and S2, regular rate and rhythm, no murmurs. Abdomen is soft, no tenderness, masses or organomegaly. Breasts: normal without suspicious masses, skin or nipple changes or axillary nodes. Pelvis: EGBUS: atrophic introitus. Normal vaginal vault, normal cervix without lesions. Uterus is normal size and shape, no masses or tenderness. Adnexae normal, without masses or tenderness. Exam is limited by her discomfort (atrophic vaginitis). Extremities are normal, without edema. Screening neurological exam is normal without focal findings. Skin is normal without suspicious lesions noted.    Labs:   Component Reference Range 02/06/2006  THYROID SCREEN TSH 0.34-5.60 uIU/mL 0.44   Component Reference Range 11/17/2005  HDL 35-85 mg/dl 49  LDL 3-875 mg/dl 643     ASSESSMENT/PLAN:   Cpe done.  V76.2 Screening for Malignant Neoplasm of the Cervix  Plan: PAP SMEAR THIN PREP     296.32 Major  Depressive Disorder, Recurrent Episode, Moderate  Comment: in tx.  Plan: continue.    V16.3 FAMILY HX-BREAST MALIG  Plan: mammo req.    780.79 FATIGUE GENERAL  Comment: ongoing - no changes.  Plan: supportive, lifestyle management.    Other issues discussed, Reviewed etiology, normal course, management of bunion, telogen effluvium and androgenic alopecia.

## 2006-12-23 ENCOUNTER — Ambulatory Visit (HOSPITAL_BASED_OUTPATIENT_CLINIC_OR_DEPARTMENT_OTHER): Payer: MEDICARE | Admitting: Clinical

## 2006-12-23 DIAGNOSIS — F449 Dissociative and conversion disorder, unspecified: Principal | ICD-10-CM

## 2006-12-28 NOTE — Progress Notes (Signed)
PSYCHIATRY OUTPATIENT PROGRESS NOTE      VISIT TYPE: Individual therapy      INTERPRETER: No interpreter needed.      PROBLEMS which this visit addressed:   Problem 1: depression      Problem 2: dissociation      Problem 3: social isolation         SOURCE(S) OF INFORMATION: Patient      SUBJECTIVE FINDINGS: "Maybe a consult would be helpful."     OBJECTIVE FINDINGS:   Pertinent positive and negative parts of mental status exam: see below.      Signs and symptoms: Moderately depressed. No active SI/HI.      Current medications (n/a for psychotherapy only visits): N/A      Medications taken as prescribed (n/a for psychotherapy only visits): N/A      Medication side effects - including movement disorders/AIMS score (n/a for psychotherapy only visits): N/A      Testing results: No test results pending.      Risk behaviors: None reported.         ASSESSMENT:   Clinical formulation: Processing feelings about therapy and exploring possible solutions.     Clinical interventions today and patient's response: Exploration and support.      Dual diagnosis stage of change: No dual diagnosis      Medical necessity for today's visit: Decrease dissociative and depressive symptoms.      Risk level per scale:    Suicide: low (1)    Violence: low (1)    Addiction: low (1)      DIAGNOSES:   Axis I (primary): 300.15      Axis I (other): 296.32     Axis II: deferred      Axis III: chronic fatigue         Axis IV: Moderate       Axis V: 50-55         PLAN: Continue individual therapy.      Risk plan (for patients at moderate/high risk for suicide/violence/addiction): Patient not at moderate or high risk.      Next visit: To be determined     FOR PSYCHOPHARMACOLOGY VISITS ONLY   Pregnancy status: N/A      Medication plan:   N/A      Medication education: N/A      Medical work-up plan/testing: N/A      Instructions to covering prescriber: N/A      Amount of time spent w/patient today: N/A      Biagio Borg, LICSW

## 2006-12-29 LAB — CYTOPATH, C/V, THIN LAYER

## 2006-12-30 ENCOUNTER — Ambulatory Visit (HOSPITAL_BASED_OUTPATIENT_CLINIC_OR_DEPARTMENT_OTHER): Payer: MEDICARE | Admitting: Clinical

## 2006-12-30 DIAGNOSIS — F449 Dissociative and conversion disorder, unspecified: Secondary | ICD-10-CM

## 2006-12-30 NOTE — Progress Notes (Signed)
PSYCHIATRY OUTPATIENT PROGRESS NOTE    VISIT TYPE: consultation to treatment     INTERPRETER: no    PROBLEMS which this visit addressed:   Consultation to ongoing psychotherapy in [facility] with Biagio Borg     SOURCE(S) OF INFORMATION: Patient and Therapist     SUBJECTIVE FINDINGS: "I feel that I am an empty shell, I need to experience a sense of acceptance from my therapist in order to develop a sense of acceptance of myself."        OBJECTIVE FINDINGS:   Pertinent positive and negative parts of mental status exam: Reports feeling that she would like more exploration in context of her ongoing treatment with Robin. Asking for increase sense of acceptance of affective states. Wondering whether this is a workable therapy.     Signs and symptoms: Reports a history of becoming scared in context of dyadic relationships and reports therapists have reported a "power struggle" in context of her treatment in the past. Reports when feeling lack of acceptance in treatment can become hypervigilant and overscrutinous of therapist interventions.     Current medications (n/a for psychotherapy only visits):     Medications taken as prescribed (n/a for psychotherapy only visits):     Medication side effects - including movement disorders/AIMS score (n/a for psychotherapy only visits):     Testing results:     Risk behaviors:       ASSESSMENT:  Clinical formulation: 51 year old single female wanting to get better sense of self so that she can return to successful engagement in the world as treatment goal. Seeking consultation from this clinician at urging of therapist Biagio Borg LICSW. Treatment is getting bogged down likely neither party communicating in same language with each other. Initial assessment is that treatment is salvagable with some consulttation     Clinical interventions today and patient's response: Explored treatment difficulties, both historical and in context of this therapy. Discussed what  patients overall goals for treament are, what interventions have been helpful and less helpful and ways treatment might move forward.    Dual diagnosis stage of change: No dual diagnosis    Medical necessity for today's visit: support treatment    Risk level per scale:   Suicide: low (1)   Violence: low (1)   Addiction:     DIAGNOSES:  Axis I (primary): PTSD     Axis I (other): dysthima    Axis II: deferred     Axis III: deferred        Axis IV: severe/trauma history      Axis V: 57      PLAN: consult with treater and make recommendations in 3 way meeting.     Risk plan (for patients at moderate/high risk for suicide/violence/addiction): Patient not at moderate or high risk.    Next visit: patient to be seen in contact in a week     FOR PSYCHOPHARMACOLOGY VISITS ONLY  Pregnancy status:     Medication plan:        Medication education:     Medical work-up plan/testing:     Instructions to covering prescriber:     Amount of time spent w/patient today: 45 minutes     Adaia Matthies, LICSW

## 2007-01-05 LAB — HUMAN PAPILLOMAVIRUS (HPV): HUMAN PAPILLOMAVIRUS: NEGATIVE

## 2007-01-07 ENCOUNTER — Ambulatory Visit (HOSPITAL_BASED_OUTPATIENT_CLINIC_OR_DEPARTMENT_OTHER): Payer: MEDICARE | Admitting: Clinical

## 2007-01-07 DIAGNOSIS — F449 Dissociative and conversion disorder, unspecified: Principal | ICD-10-CM

## 2007-01-08 ENCOUNTER — Ambulatory Visit (HOSPITAL_BASED_OUTPATIENT_CLINIC_OR_DEPARTMENT_OTHER): Payer: Self-pay | Admitting: Internal Medicine

## 2007-01-11 NOTE — Progress Notes (Signed)
Client attended group screen for trauma info group. It was quite clear that the client is currently not a candidate as she is in what she describes as an unstable individual treatment that she may be ending. In addition, she had a significantly different presentation than other candidates for the group and agreed that she would find it distressing to join with this being the dynamic (everyone else in the group is clear about their traumatic past and she struggles with feeling "extraordinarily unclear" by her own report). A general womens psychotherapy group might be more appropriate at this juncture

## 2007-01-12 ENCOUNTER — Ambulatory Visit: Payer: Self-pay | Admitting: Optometry

## 2007-01-22 ENCOUNTER — Ambulatory Visit: Payer: MEDICARE | Admitting: Optometry

## 2007-01-22 ENCOUNTER — Ambulatory Visit (HOSPITAL_BASED_OUTPATIENT_CLINIC_OR_DEPARTMENT_OTHER): Payer: Self-pay

## 2007-01-26 LAB — MA SCREENING MAMMO BILATERAL WITH CAD

## 2007-02-01 ENCOUNTER — Ambulatory Visit (HOSPITAL_BASED_OUTPATIENT_CLINIC_OR_DEPARTMENT_OTHER): Payer: Self-pay | Admitting: Clinical

## 2007-02-01 DIAGNOSIS — F4310 Post-traumatic stress disorder, unspecified: Secondary | ICD-10-CM

## 2007-02-18 NOTE — Progress Notes (Signed)
EPIC ABSTRACTION FOR ADULT OUTPATIENT PSYCHIATRY    CLINICAL PRESENTATION: second consultation meeting regarding viabiltiy of continuing treatment in [facility] with Brianna [name]. Met with patient once individually now meeting with both patient and clinician.    Patient evidently discontented/angry with Brianna [name] and her perception of Robin's treatment stance. Demanding a more empathic approach in a manner which makes it particulalry difficult for Brianna [name] to extend herself. Although clincian clearly did try, patient demanding of exact mirroring, and looking for more accepting holding environment and affective mirroring than is generally advised in trauma recover or practiced in [facility] program.    Of note, patient much more activated/agitated with Robin in the room, than when she met independantly with this clinician. Follow up plan is for this consultant to have follow up with phone consultation re: Brianna [name] of this transference at this time.

## 2007-03-17 ENCOUNTER — Ambulatory Visit (HOSPITAL_BASED_OUTPATIENT_CLINIC_OR_DEPARTMENT_OTHER): Payer: MEDICARE | Admitting: Psychologist

## 2007-03-17 DIAGNOSIS — F4310 Post-traumatic stress disorder, unspecified: Secondary | ICD-10-CM
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
## 2007-03-24 ENCOUNTER — Ambulatory Visit (HOSPITAL_BASED_OUTPATIENT_CLINIC_OR_DEPARTMENT_OTHER): Payer: MEDICARE | Admitting: Psychologist

## 2007-03-24 DIAGNOSIS — F4310 Post-traumatic stress disorder, unspecified: Secondary | ICD-10-CM
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
## 2007-03-25 ENCOUNTER — Encounter (HOSPITAL_BASED_OUTPATIENT_CLINIC_OR_DEPARTMENT_OTHER): Payer: Self-pay | Admitting: Clinical

## 2007-03-25 NOTE — Progress Notes (Signed)
PSYCHIATRY TERMINATION AND TRANSFER NOTE    Transfer Document    Date treatment started: 1/08    Transfer/termination date: 5/08    Reason for treatment: Patient came to the [facility] seeking support for dissociative symptoms, relational difficulties, and issues around daily living.    Treatment course (response to medications, compliance): Focus of treatment was often on the dyad of therapist and patient and perceived slights or misunderstandings; patient was quite sensitive to self and other issues and eventually these issues made ongoing therapy untenable.    Outstanding Issues: Gaining a greater understanding and control over dissociative processes, finding greater satisfaction in both work and relationships.    Safe to refill: N/A    Risk level: Low    Plan: Patient to be transferred to [facility].    For transfers, the treatment plan will now be the responsibility of: Assigned therapist in the [facility].    (Clinician, please route/cc this encounter to your team/program administrative coordinator so that the treatment plan database can be updated.)

## 2007-03-29 ENCOUNTER — Encounter (HOSPITAL_BASED_OUTPATIENT_CLINIC_OR_DEPARTMENT_OTHER): Payer: Self-pay | Admitting: Psychologist

## 2007-04-16 ENCOUNTER — Ambulatory Visit (HOSPITAL_BASED_OUTPATIENT_CLINIC_OR_DEPARTMENT_OTHER): Payer: MEDICARE | Admitting: Clinical

## 2007-04-16 ENCOUNTER — Ambulatory Visit (HOSPITAL_BASED_OUTPATIENT_CLINIC_OR_DEPARTMENT_OTHER): Payer: MEDICARE | Admitting: Psychologist

## 2007-04-16 DIAGNOSIS — F449 Dissociative and conversion disorder, unspecified: Principal | ICD-10-CM
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
## 2007-04-16 NOTE — Progress Notes (Signed)
PSYCHIATRY OUTPATIENT PROGRESS NOTE    VISIT TYPE: Psychotherapy     INTERPRETER: No interpreter needed.    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 Discussed with her the purpose and policies of the group. Discussed her reasons for seeking grp tx. Client reported feeling eager to be in a group, but nervous about the "mask" she wears and how to present herself authentically to others. Client described how she may dissociate some of her affect which appears to get enacted in relationships. Discussed how they may come up in the group and how it potentially could be a good learning opportunity for her and the group to work with.   OBJECTIVE FINDINGS:   Pertinent positive and negative parts of mental status exam: Engaged, motivated for group treatment, anxious mood, appropriate content    Signs and symptoms: anxious mood     Risk behaviors: None reported.       ASSESSMENT:  Clinical formulation: Client appears motivated to join group tx to learn more about herself and benefit from the feedback/support of other women.     Clinical interventions today and patient's response: psychoeducation, support, connection to additional resources, orientation to group treatment    Dual diagnosis stage of change: No dual diagnosis    Medical necessity for today's visit: maintain highest fx and stability, decrease sxs    Risk level per scale:   Suicide: low (1)   Violence: low (1)   Addiction: low (1)    DIAGNOSES:  Axis I (primary): Major Depression, recurrent, moderate     Axis II: Deferred     Axis III: Not evaluated          Axis IV: Life events, social environment, primary support grp    Axis V: 60    PLAN: Begin women's psychotherapy group; communicated this outcome of referral to [employer]      Risk plan (for patients at moderate/high risk for suicide/violence/addiction): Patient not at moderate or high risk.    Next visit: patient to be seen in 1 week.     Amount of time spent w/patient today: 30 minutes     Purvis Kilts, LICSW

## 2007-05-04 ENCOUNTER — Ambulatory Visit (HOSPITAL_BASED_OUTPATIENT_CLINIC_OR_DEPARTMENT_OTHER): Payer: MEDICARE | Admitting: Clinical

## 2007-05-04 ENCOUNTER — Encounter (HOSPITAL_BASED_OUTPATIENT_CLINIC_OR_DEPARTMENT_OTHER): Payer: Self-pay | Admitting: Clinical

## 2007-05-04 DIAGNOSIS — F449 Dissociative and conversion disorder, unspecified: Principal | ICD-10-CM
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
## 2007-05-07 ENCOUNTER — Encounter (HOSPITAL_BASED_OUTPATIENT_CLINIC_OR_DEPARTMENT_OTHER): Payer: Self-pay | Admitting: Clinical

## 2007-05-10 ENCOUNTER — Ambulatory Visit (HOSPITAL_BASED_OUTPATIENT_CLINIC_OR_DEPARTMENT_OTHER): Payer: MEDICARE

## 2007-05-10 DIAGNOSIS — F449 Dissociative and conversion disorder, unspecified: Secondary | ICD-10-CM

## 2007-05-10 DIAGNOSIS — F331 Major depressive disorder, recurrent, moderate: Secondary | ICD-10-CM

## 2007-05-10 NOTE — Progress Notes (Signed)
PSYCHIATRY OUTPATIENT NOTE    VISIT TYPE: Psychotherapy group intake    INTERPRETER: No interpreter needed.    PROBLEMS which this visit addressed:   Problem 1: Dissociation, esp negative affects    Problem 2: Social isolation/lack of sufficient supports     Problem 3: Relational issues     SOURCE(S) OF INFORMATION: Patient    SUBJECTIVE FINDINGS: Met with pt for group intake. Discussed with her the purpose and policies of the group, including confidentiality, noncontact with other members, attendance, 4-wk notice, 6 month commitment. Pt asks about working of group, asks about 'theoretical' background, and about how group would react if she is quiet. She related concern about in-group dissociation.   OBJECTIVE FINDINGS:   Pertinent positive and negative parts of mental status exam: Engaged, motivated for group treatment, anxious mood, appropriate content    Signs and symptoms: anxious mood     Risk behaviors: None reported.       ASSESSMENT:  Clinical formulation: Client appears motivated to join group tx to gain insight and emotional regulation and to evolve new behaviors, especially relational ones.     Clinical interventions today and patient's response: psychoeducation, support, orientation to group treatment    Dual diagnosis stage of change: No dual diagnosis    Medical necessity for today's visit: maintain highest fx and stability, decrease sxs    Risk level per scale:   Suicide: low (1)   Violence: low (1)   Addiction: low (1)    DIAGNOSES:  Axis I (primary): Dissociative d/o NOS. Major Depression, recurrent, moderate.   Axis II: Deferred   Axis III: osteopenia, h/o hyperthyroidism   Axis IV: Life events, social environment, primary support grp   Axis V: 60    PLAN: Discuss with pt's therapist, R. Drill, and with supervisor.     Risk plan (for patients at moderate/high risk for suicide/violence/addiction): Patient not at moderate or high risk.    Next visit: pending patient decision/review with pt.      Amount of time spent w/patient today: 45 minutes     Alleen Borne, MD

## 2007-05-11 ENCOUNTER — Ambulatory Visit (HOSPITAL_BASED_OUTPATIENT_CLINIC_OR_DEPARTMENT_OTHER): Payer: MEDICARE | Admitting: Clinical

## 2007-05-11 DIAGNOSIS — F331 Major depressive disorder, recurrent, moderate: Secondary | ICD-10-CM

## 2007-05-11 DIAGNOSIS — F449 Dissociative and conversion disorder, unspecified: Principal | ICD-10-CM

## 2007-05-11 NOTE — Progress Notes (Signed)
OUTPATIENT PSYCHIATRY GROUP PROGRESS NOTE      Patient Name: Brianna [name]      Group Name: Women's Psychotherapy Group      Leaders: Purvis Kilts LICSW and Bobby Rumpf     Service Type: [phone] Group Psychotherapy      Length of Group: 75 minutes      Purpose of Group (choose all that apply):   Symptom relief   Affect regulation   Illness management   Interpersonal skill development   Support (psychological, family, community resources)   Insight and behavior change      Group Process: Group discussed feelings about prioritizing their own feelings and "taking up space" when they have been socialized not to. Group discussed ways to manage anger and other strong feelings that may relate to experiences from childhood. Group welcomed this new member who is considering the group.      Individual Patient Participation:   Attentive to process   Active participant   Asked appropriate questions   Contributed constructively   Engaged in topic   Identified with peers      Diagnosis (addressed by this group): Major Depression, recurrent      Medical Necessity of Session (how treatment is necessary to improve symptoms, functioning, or prevent worsening): maintain stability, decrease symptoms, improve interpersonal fx      Relevant Changes in Mental Status: No.      Risk Level per Scale:   Suicide: re-assessment not indicated today   Violence: re-assessment not indicated today   Addiction: re-assessment not indicated today      Current risk level represents increase in risk: No.      Client to continue attending grp on weekly basis.
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
## 2007-05-18 ENCOUNTER — Ambulatory Visit (HOSPITAL_BASED_OUTPATIENT_CLINIC_OR_DEPARTMENT_OTHER): Payer: MEDICARE | Admitting: Clinical

## 2007-05-18 DIAGNOSIS — F331 Major depressive disorder, recurrent, moderate: Secondary | ICD-10-CM

## 2007-05-18 DIAGNOSIS — F449 Dissociative and conversion disorder, unspecified: Principal | ICD-10-CM

## 2007-05-18 NOTE — Progress Notes (Signed)
OUTPATIENT PSYCHIATRY GROUP PROGRESS NOTE      Patient Name: Brianna [name]      Group Name: Women's Psychotherapy Group      Leaders: Purvis Kilts LICSW and Bobby Rumpf     Service Type: [phone] Group Psychotherapy      Length of Group: 75 minutes      Purpose of Group (choose all that apply):   Symptom relief   Affect regulation   Illness management   Interpersonal skill development   Support (psychological, family, community resources)   Insight and behavior change      Group Process: Group discussed feelings about their own judgment of themselves and the judgment they perceive coming from others. Group discussed experience of social anxiety and anxious feelings that may come up as a result of group process.    Individual Patient Participation:   Attentive to process   Active participant   Asked appropriate questions   Contributed constructively   Engaged in topic   Identified with peers      Diagnosis (addressed by this group): Major Depression, recurrent      Medical Necessity of Session (how treatment is necessary to improve symptoms, functioning, or prevent worsening): maintain stability, decrease symptoms, improve interpersonal fx      Relevant Changes in Mental Status: No.      Risk Level per Scale:   Suicide: re-assessment not indicated today   Violence: re-assessment not indicated today   Addiction: re-assessment not indicated today      Current risk level represents increase in risk: No.      Client to continue attending grp on weekly basis.
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
## 2007-05-18 NOTE — Progress Notes (Signed)
PSYCHIATRY OUTPATIENT PROGRESS NOTE    VISIT TYPE: Psychotherapy     INTERPRETER: No interpreter needed.    PROBLEMS which this visit addressed:   Problem 1: Dissociation, esp negative affects    Problem 2: Social isolation/lack of sufficient supports      Problem 3: Relational issues     SOURCE(S) OF INFORMATION: Patient     SUBJECTIVE FINDINGS: Met with client to discuss her decision to join women's group. She reported anxiety about how to use the group and whether/how to let the group in on how much social anxiety she experiences in group setting. Client reported wanting to join group and give it a try, despite these fears.   OBJECTIVE FINDINGS:   Pertinent positive and negative parts of mental status exam: Engaged, motivated for group treatment, anxious mood, appropriate content    Signs and symptoms: anxious mood     Risk behaviors: None reported.       ASSESSMENT:  Clinical formulation: Client appears motivated to join group tx to learn more about herself and benefit from the feedback/support of other women.     Clinical interventions today and patient's response: psychoeducation, support, connection to additional resources, orientation to group treatment    Dual diagnosis stage of change: No dual diagnosis    Medical necessity for today's visit: maintain highest fx and stability, decrease sxs    Risk level per scale:   Suicide: low (1)   Violence: low (1)   Addiction: low (1)    DIAGNOSES:  Axis I (primary): Major Depression, recurrent, moderate     Axis II: Deferred     Axis III: Not evaluated          Axis IV: Life events, social environment, primary support grp    Axis V: 60    PLAN: Begin women's psychotherapy group     Risk plan (for patients at moderate/high risk for suicide/violence/addiction): Patient not at moderate or high risk.    Next visit: patient to be seen later today in group tx     Amount of time spent w/patient today: 30 minutes     Purvis Kilts, LICSW

## 2007-06-01 ENCOUNTER — Ambulatory Visit (HOSPITAL_BASED_OUTPATIENT_CLINIC_OR_DEPARTMENT_OTHER): Payer: MEDICARE | Admitting: Clinical

## 2007-06-01 DIAGNOSIS — F449 Dissociative and conversion disorder, unspecified: Secondary | ICD-10-CM
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
## 2007-06-01 NOTE — Progress Notes (Addendum)
Addended by: Purvis Kilts on: 06/01/2007 1:40:29 PM     Modules accepted: Level of Service

## 2007-06-08 ENCOUNTER — Ambulatory Visit (HOSPITAL_BASED_OUTPATIENT_CLINIC_OR_DEPARTMENT_OTHER): Payer: MEDICARE | Admitting: Clinical

## 2007-06-08 DIAGNOSIS — F449 Dissociative and conversion disorder, unspecified: Secondary | ICD-10-CM

## 2007-06-08 DIAGNOSIS — F331 Major depressive disorder, recurrent, moderate: Principal | ICD-10-CM

## 2007-06-08 NOTE — Progress Notes (Signed)
OUTPATIENT PSYCHIATRY GROUP PROGRESS NOTE      Patient Name: [name]      Group Name: [facility]      Leaders: Purvis Kilts LICSW and Bobby Rumpf     Service Type: [phone] Group Psychotherapy      Length of Group: 75 minutes      Purpose of Group (choose all that apply):   Symptom relief   Affect regulation   Illness management   Interpersonal skill development   Support (psychological, family, community resources)   Insight and behavior change      Group Process: Group discussed feelings abotu entitlement to feelings and self-protection, esp feelings of anger, guilt, and anxiety. Group members processed how these feelings come up in the context of the group.    Individual Patient Participation:   Attentive to process   Active participant   Asked appropriate questions   Contributed constructively   Engaged in topic   Identified with peers      Diagnosis (addressed by this group): Major Depression, recurrent      Medical Necessity of Session (how treatment is necessary to improve symptoms, functioning, or prevent worsening): maintain stability, decrease symptoms, improve interpersonal fx      Relevant Changes in Mental Status: No.      Risk Level per Scale:   Suicide: re-assessment not indicated today   Violence: re-assessment not indicated today   Addiction: re-assessment not indicated today      Current risk level represents increase in risk: No.      Client to continue attending grp on weekly basis.
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
## 2007-06-15 ENCOUNTER — Ambulatory Visit (HOSPITAL_BASED_OUTPATIENT_CLINIC_OR_DEPARTMENT_OTHER): Payer: MEDICARE | Admitting: Clinical

## 2007-06-15 DIAGNOSIS — F331 Major depressive disorder, recurrent, moderate: Secondary | ICD-10-CM

## 2007-06-15 DIAGNOSIS — F449 Dissociative and conversion disorder, unspecified: Principal | ICD-10-CM
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
## 2007-06-15 NOTE — Progress Notes (Signed)
OUTPATIENT PSYCHIATRY GROUP PROGRESS NOTE      Patient Name: Brianna [name]     Group Name: [facility]      Leaders: Purvis Kilts LICSW      Service Type: [phone] Group Psychotherapy      Length of Group: 75 minutes      Purpose of Group (choose all that apply):   Symptom relief   Affect regulation   Illness management   Interpersonal skill development   Support (psychological, family, community resources)   Insight and behavior change      Group Process: Group members discussed feelings about nurturing themselves, even when others are not compassionate towards them or providing for their needs. Group members discussed how to "tune in" to their needs in group.      Individual Patient Participation:   Attentive to process   Active participant   Asked appropriate questions   Contributed constructively   Engaged in topic   Identified with peers      Diagnosis (addressed by this group): Major Depression, recurrent      Medical Necessity of Session (how treatment is necessary to improve symptoms, functioning, or prevent worsening): maintain stability, decrease symptoms, improve interpersonal fx      Relevant Changes in Mental Status: No.      Risk Level per Scale:   Suicide: re-assessment not indicated today   Violence: re-assessment not indicated today   Addiction: re-assessment not indicated today      Current risk level represents increase in risk: No.      Client to continue attending grp on weekly basis.

## 2007-06-22 ENCOUNTER — Ambulatory Visit (HOSPITAL_BASED_OUTPATIENT_CLINIC_OR_DEPARTMENT_OTHER): Payer: MEDICARE | Admitting: Clinical

## 2007-06-22 DIAGNOSIS — F449 Dissociative and conversion disorder, unspecified: Secondary | ICD-10-CM

## 2007-06-22 DIAGNOSIS — F331 Major depressive disorder, recurrent, moderate: Secondary | ICD-10-CM
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
## 2007-06-22 NOTE — Progress Notes (Signed)
OUTPATIENT PSYCHIATRY GROUP PROGRESS NOTE      Patient Name: Brianna [name]     Group Name: [facility]      Leaders: Purvis Kilts LICSW      Service Type: [phone] Group Psychotherapy      Length of Group: 75 minutes      Purpose of Group (choose all that apply):   Symptom relief   Affect regulation   Illness management   Interpersonal skill development   Support (psychological, family, community resources)   Insight and behavior change      Group Process: Group members discussed knowing/not knowing their feelings and the process/result of sharing them with others in their lives and in group. Group members discussed the effect of political issues (like the recent election) and the upcoming holidays on their moods.      Individual Patient Participation:   Attentive to process   Active participant   Asked appropriate questions   Contributed constructively   Engaged in topic   Identified with peers      Diagnosis (addressed by this group): Major Depression, recurrent      Medical Necessity of Session (how treatment is necessary to improve symptoms, functioning, or prevent worsening): maintain stability, decrease symptoms, improve interpersonal fx      Relevant Changes in Mental Status: No.      Risk Level per Scale:   Suicide: re-assessment not indicated today   Violence: re-assessment not indicated today   Addiction: re-assessment not indicated today      Current risk level represents increase in risk: No.      Client to continue attending grp on weekly basis.

## 2007-06-29 ENCOUNTER — Ambulatory Visit (HOSPITAL_BASED_OUTPATIENT_CLINIC_OR_DEPARTMENT_OTHER): Payer: MEDICARE | Admitting: Clinical

## 2007-06-29 DIAGNOSIS — F331 Major depressive disorder, recurrent, moderate: Secondary | ICD-10-CM

## 2007-06-29 DIAGNOSIS — F449 Dissociative and conversion disorder, unspecified: Secondary | ICD-10-CM

## 2007-06-29 NOTE — Progress Notes (Signed)
OUTPATIENT PSYCHIATRY GROUP PROGRESS NOTE      Patient Name: Brianna [name]     Group Name: [facility]      Leaders: Purvis Kilts LICSW      Service Type: [phone] Group Psychotherapy      Length of Group: 75 minutes      Purpose of Group (choose all that apply):   Symptom relief   Affect regulation   Illness management   Interpersonal skill development   Support (psychological, family, community resources)   Insight and behavior change      Group Process: Group discussed feelings about challenging negative internalized thoughts, esp from one's parents in childhood, rather than perpetuating the negative feelings. Group discussed how recovery is a process and may never be "finished." Member participated spontaneously today for the first time.     Individual Patient Participation:   Attentive to process   Active participant   Asked appropriate questions   Contributed constructively   Engaged in topic   Identified with peers      Diagnosis (addressed by this group): Major Depression, recurrent      Medical Necessity of Session (how treatment is necessary to improve symptoms, functioning, or prevent worsening): maintain stability, decrease symptoms, improve interpersonal fx      Relevant Changes in Mental Status: No.      Risk Level per Scale:   Suicide: re-assessment not indicated today   Violence: re-assessment not indicated today   Addiction: re-assessment not indicated today      Current risk level represents increase in risk: No.      Client to continue attending grp on weekly basis.
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
## 2007-07-06 ENCOUNTER — Ambulatory Visit (HOSPITAL_BASED_OUTPATIENT_CLINIC_OR_DEPARTMENT_OTHER): Payer: MEDICARE | Admitting: Clinical

## 2007-07-06 DIAGNOSIS — F331 Major depressive disorder, recurrent, moderate: Secondary | ICD-10-CM

## 2007-07-06 DIAGNOSIS — F449 Dissociative and conversion disorder, unspecified: Secondary | ICD-10-CM
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
## 2007-07-06 NOTE — Progress Notes (Signed)
OUTPATIENT PSYCHIATRY GROUP PROGRESS NOTE      Patient Name: Brianna [name]     Group Name: [facility]      Leaders: Purvis Kilts LICSW      Service Type: [phone] Group Psychotherapy      Length of Group: 75 minutes      Purpose of Group (choose all that apply):   Symptom relief   Affect regulation   Illness management   Interpersonal skill development   Support (psychological, family, community resources)   Insight and behavior change      Group Process: Group discussed feelings about upcoming holidays, including how to feel empowered to listen to one's own wants/needs despite family pressures, and how to engage with family at gatherings even in the midst of family conflicts.      Individual Patient Participation:   Attentive to process   Active participant   Asked appropriate questions   Contributed constructively   Engaged in topic   Identified with peers      Diagnosis (addressed by this group): Major Depression, recurrent      Medical Necessity of Session (how treatment is necessary to improve symptoms, functioning, or prevent worsening): maintain stability, decrease symptoms, improve interpersonal fx      Relevant Changes in Mental Status: No.      Risk Level per Scale:   Suicide: re-assessment not indicated today   Violence: re-assessment not indicated today   Addiction: re-assessment not indicated today      Current risk level represents increase in risk: No.      Client to continue attending grp on weekly basis.

## 2007-07-13 ENCOUNTER — Ambulatory Visit (HOSPITAL_BASED_OUTPATIENT_CLINIC_OR_DEPARTMENT_OTHER): Payer: MEDICARE | Admitting: Clinical

## 2007-07-13 DIAGNOSIS — F331 Major depressive disorder, recurrent, moderate: Secondary | ICD-10-CM

## 2007-07-13 NOTE — Progress Notes (Signed)
OUTPATIENT PSYCHIATRY GROUP PROGRESS NOTE      Patient Name: Adelin Ventrella     Group Name: [facility]      Leaders: Purvis Kilts LICSW      Service Type: [phone] Group Psychotherapy      Length of Group: 75 minutes      Purpose of Group (choose all that apply):   Symptom relief   Affect regulation   Illness management   Interpersonal skill development   Support (psychological, family, community resources)   Insight and behavior change      Group Process: Group discussed feelings about talking in group and in other social situations, including fear, guilt, self-consciousness, and anxiety that gets evoked when the focus is on them. Group members discussed that it would be helpful to attend to chronic feelings/concerns, not just crises or "burning issues."     Individual Patient Participation:   Attentive to process   Active participant   Asked appropriate questions   Contributed constructively   Engaged in topic   Identified with peers      Diagnosis (addressed by this group): Major Depression, recurrent      Medical Necessity of Session (how treatment is necessary to improve symptoms, functioning, or prevent worsening): maintain stability, decrease symptoms, improve interpersonal fx      Relevant Changes in Mental Status: No.      Risk Level per Scale:   Suicide: re-assessment not indicated today   Violence: re-assessment not indicated today   Addiction: re-assessment not indicated today      Current risk level represents increase in risk: No.      Client to continue attending grp on weekly basis.
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
## 2007-07-20 ENCOUNTER — Ambulatory Visit (HOSPITAL_BASED_OUTPATIENT_CLINIC_OR_DEPARTMENT_OTHER): Payer: MEDICARE | Admitting: Clinical

## 2007-07-20 DIAGNOSIS — F449 Dissociative and conversion disorder, unspecified: Secondary | ICD-10-CM

## 2007-07-20 DIAGNOSIS — F331 Major depressive disorder, recurrent, moderate: Secondary | ICD-10-CM
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
## 2007-07-20 NOTE — Progress Notes (Signed)
OUTPATIENT PSYCHIATRY GROUP PROGRESS NOTE      Patient Name: Brianna [name]     Group Name: [facility]      Leaders: Purvis Kilts LICSW      Service Type: [phone] Group Psychotherapy      Length of Group: 75 minutes      Purpose of Group (choose all that apply):   Symptom relief   Affect regulation   Illness management   Interpersonal skill development   Support (psychological, family, community resources)   Insight and behavior change      Group Process: Group discussed feelings group process and the increased work/introspection involved in paying attention to one's affect in the moment and looking more at the group's process. Group members discussed feelings of guilt and sadness esp related to losses, anniversaries, and holidays.     Individual Patient Participation:   Attentive to process   Active participant   Asked appropriate questions   Contributed constructively   Engaged in topic   Identified with peers      Diagnosis (addressed by this group): Major Depression, recurrent      Medical Necessity of Session (how treatment is necessary to improve symptoms, functioning, or prevent worsening): maintain stability, decrease symptoms, improve interpersonal fx      Relevant Changes in Mental Status: No.      Risk Level per Scale:   Suicide: re-assessment not indicated today   Violence: re-assessment not indicated today   Addiction: re-assessment not indicated today      Current risk level represents increase in risk: No.      Client to continue attending grp on weekly basis.

## 2007-07-27 ENCOUNTER — Ambulatory Visit (HOSPITAL_BASED_OUTPATIENT_CLINIC_OR_DEPARTMENT_OTHER): Payer: MEDICARE | Admitting: Clinical

## 2007-07-27 DIAGNOSIS — F331 Major depressive disorder, recurrent, moderate: Secondary | ICD-10-CM

## 2007-07-27 DIAGNOSIS — F449 Dissociative and conversion disorder, unspecified: Secondary | ICD-10-CM
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
## 2007-07-30 ENCOUNTER — Ambulatory Visit (HOSPITAL_BASED_OUTPATIENT_CLINIC_OR_DEPARTMENT_OTHER): Payer: MEDICARE | Admitting: Internal Medicine

## 2007-07-30 VITALS — BP 118/88 | Temp 100.5°F | Wt 121.0 lb

## 2007-07-30 DIAGNOSIS — J069 Acute upper respiratory infection, unspecified: Secondary | ICD-10-CM

## 2007-07-30 DIAGNOSIS — M21619 Bunion of unspecified foot: Principal | ICD-10-CM

## 2007-07-30 NOTE — Progress Notes (Signed)
Brianna [name] is a 51 year old w  Here for:  1. Nasal congestion, post-nasal drip and cough, some myalgias - no fevers - no sinus pain/pressure - since 4 d's mild URI then 5 d's rhinitis/congestion.  2. Bunion, painful R.    OBJECTIVE:   BP 118/88  Temp (Src) 100.5 F (38.1 C) (Temporal)  Wt 121 lb (54.885 kg)    Congested voice - wiping her nose -   ENT: Conjunctivae and sclerae are normal. Sinuses are nontender. External auditory canals normal, tympanic membranes with slightly decreased light reflex.   Nasal exam reveals redness, congestion, mucous. Oropharynx: normal teeth and gums, posterior oropharynx with cobblestoning suggestive of post-nasal drip. Neck is supple without masses, no cervical lymphadenopathy. Chest clear to auscultation.   R foot with bunion.    ASSESSMENT/PLAN:   727.1 Bunion  (primary encounter diagnosis)- Reviewed etiology, normal course, management and prognosis of. to podiatry.  465.9J URI (Upper Respiratory Infection) - reviewed nature of condition - non-pathologic, common, often recurrent; options for treatment.

## 2007-08-02 ENCOUNTER — Telehealth (HOSPITAL_BASED_OUTPATIENT_CLINIC_OR_DEPARTMENT_OTHER): Payer: Self-pay

## 2007-08-02 NOTE — Telephone Encounter (Signed)
Pt was seen on 12/19 with URI, feeling worse, reports thick yellow mucus, coughing more frequently, suspected fever,  yellow crusting OU in the morning.  Forwarded to provider for review.

## 2007-08-02 NOTE — Telephone Encounter (Signed)
Staff Message copied by Earleen Reaper on Mon Aug 02, 2007 9:13 AM  ------   Message from: Garlon Hatchet   Created: Mon Aug 02, 2007 9:02 AM   Regarding: question     Home [phone]   or  Mobile [phone]    Patient was here Friday to see Dr. Creed Copper. She thinks she is worse now. She is coughing up yellow mucus. Wants to know what she can do

## 2007-08-03 ENCOUNTER — Ambulatory Visit (HOSPITAL_BASED_OUTPATIENT_CLINIC_OR_DEPARTMENT_OTHER): Payer: MEDICARE | Admitting: Clinical

## 2007-08-03 DIAGNOSIS — F331 Major depressive disorder, recurrent, moderate: Principal | ICD-10-CM

## 2007-08-03 DIAGNOSIS — F449 Dissociative and conversion disorder, unspecified: Secondary | ICD-10-CM

## 2007-08-03 NOTE — Telephone Encounter (Signed)
Pt notified of MD message, she expressed understanding, will call or RTC if symptoms worsen.

## 2007-08-03 NOTE — Telephone Encounter (Signed)
Her viral upper respiratory infection symptoms started 12/15, so she's now at day 8: this is exactly what I would expect to be happening at day 8 of a viral upper respiratory infection.  Things that may help:  - pseudoephedrine (kept behind counter)  - steam, vapor  - understand that most people get thick yellow-green mucous on day 5 to 7 of a viral upper respiratory infection, and it is normal to then be coughing this up for another 1 to 2 weeks.    If she is worried, she can certainly make an appointment , but her symptoms do sound exactly normal for viral upper respiratory infection, for which a doctor visit is ... Optional.
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
## 2007-08-04 ENCOUNTER — Telehealth (HOSPITAL_BASED_OUTPATIENT_CLINIC_OR_DEPARTMENT_OTHER): Payer: Self-pay | Admitting: Registered Nurse

## 2007-08-04 NOTE — Telephone Encounter (Addendum)
Pt says it is hard to tell if she is getting better. She has a non producted cough. Suggested more fluids and otc medication.She will cont with tx and call back if sx get worse.

## 2007-08-04 NOTE — Telephone Encounter (Signed)
Staff Message copied by Noreene Larsson on Wed Aug 04, 2007 9:38 AM  ------   Message from: Wilder Glade   Created: Wed Aug 04, 2007 9:11 AM   Regarding: concern   Contact: [phone]    Patient was here Friday with a cough and she would like a better understanding of what is helpful for it.    Please call   [phone]

## 2007-08-10 ENCOUNTER — Ambulatory Visit (HOSPITAL_BASED_OUTPATIENT_CLINIC_OR_DEPARTMENT_OTHER): Payer: MEDICARE | Admitting: Clinical

## 2007-08-10 DIAGNOSIS — F449 Dissociative and conversion disorder, unspecified: Secondary | ICD-10-CM

## 2007-08-10 DIAGNOSIS — F331 Major depressive disorder, recurrent, moderate: Secondary | ICD-10-CM
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
## 2007-08-17 ENCOUNTER — Ambulatory Visit (HOSPITAL_BASED_OUTPATIENT_CLINIC_OR_DEPARTMENT_OTHER): Payer: MEDICARE | Admitting: Clinical

## 2007-08-17 DIAGNOSIS — F331 Major depressive disorder, recurrent, moderate: Principal | ICD-10-CM

## 2007-08-17 DIAGNOSIS — F449 Dissociative and conversion disorder, unspecified: Secondary | ICD-10-CM
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
## 2007-08-24 ENCOUNTER — Ambulatory Visit (HOSPITAL_BASED_OUTPATIENT_CLINIC_OR_DEPARTMENT_OTHER): Payer: MEDICARE | Admitting: Clinical

## 2007-08-24 DIAGNOSIS — F331 Major depressive disorder, recurrent, moderate: Principal | ICD-10-CM

## 2007-08-24 DIAGNOSIS — F449 Dissociative and conversion disorder, unspecified: Secondary | ICD-10-CM
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
## 2007-08-31 ENCOUNTER — Ambulatory Visit (HOSPITAL_BASED_OUTPATIENT_CLINIC_OR_DEPARTMENT_OTHER): Payer: MEDICARE | Admitting: Clinical

## 2007-09-07 ENCOUNTER — Ambulatory Visit (HOSPITAL_BASED_OUTPATIENT_CLINIC_OR_DEPARTMENT_OTHER): Payer: MEDICARE | Admitting: Clinical

## 2007-09-07 DIAGNOSIS — F449 Dissociative and conversion disorder, unspecified: Secondary | ICD-10-CM

## 2007-09-07 DIAGNOSIS — F331 Major depressive disorder, recurrent, moderate: Principal | ICD-10-CM
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
## 2007-09-14 ENCOUNTER — Ambulatory Visit (HOSPITAL_BASED_OUTPATIENT_CLINIC_OR_DEPARTMENT_OTHER): Payer: MEDICARE | Admitting: Clinical

## 2007-09-14 DIAGNOSIS — F331 Major depressive disorder, recurrent, moderate: Secondary | ICD-10-CM

## 2007-09-14 DIAGNOSIS — F449 Dissociative and conversion disorder, unspecified: Secondary | ICD-10-CM
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
## 2007-09-21 ENCOUNTER — Ambulatory Visit (HOSPITAL_BASED_OUTPATIENT_CLINIC_OR_DEPARTMENT_OTHER): Payer: MEDICARE | Admitting: Clinical

## 2007-09-21 DIAGNOSIS — F331 Major depressive disorder, recurrent, moderate: Principal | ICD-10-CM

## 2007-09-21 DIAGNOSIS — F449 Dissociative and conversion disorder, unspecified: Secondary | ICD-10-CM
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
## 2007-09-24 ENCOUNTER — Ambulatory Visit (HOSPITAL_BASED_OUTPATIENT_CLINIC_OR_DEPARTMENT_OTHER): Payer: MEDICARE | Admitting: Psychologist

## 2007-09-24 DIAGNOSIS — F449 Dissociative and conversion disorder, unspecified: Principal | ICD-10-CM

## 2007-09-24 NOTE — Progress Notes (Signed)
PSYCHIATRY OUTPATIENT PROGRESS NOTE    VISIT TYPE: Psychotherapy         INTERPRETER: No interpreter needed.    PROBLEMS which this visit addressed:   Problem 1: management of emotions    Problem 2:     Problem 3:          Problem 4:          SOURCE(S) OF INFORMATION:  Patient     SUBJECTIVE FINDINGS:  Pt reported that she tends to be overly rational, and to have difficulty experiencing her feelings.                                                      OBJECTIVE FINDINGS:   Pertinent positive and negative parts of mental status exam: Pt articulate and motvated for treatment.        Signs and symptoms: Pt reported a history of dissociation and avoidance.  Pt reported that she often doesn't know what she is feeling.  Pt reported that she will sometimes find herself saying childlike phrases and not understand why she is saying them.  Pt reported that difficulty believing in herself or caring about herself can lower her motivation to do things.  Pt reported that part of her would like to better know and understand her emotions, and that another part of her is terrified of doing so.           ASSESSMENT:  Clinical formulation:   Pt reports longstanding dissociation and avoidance.      Clinical interventions today and patient's response: Discussed possibility of pt participating in DBT group.  Pt reported some interest in doing so, and will further discuss the possibility with her individual therapist, Burlene Arnt, LICSW.    Dual diagnosis stage of change: No dual diagnosis    Medical necessity for today's visit: To address management of emotions.    Risk level per scale:     Suicide: low (1)     Violence: low (1)     Addiction: low (1)    DIAGNOSES:  Axis I (primary): Dissociative Disorder, NOS     Axis I (other):     Axis II: deferred       Axis III: none identified          Axis IV: 4      Axis V: 54      PLAN: Pt will further discuss with her individual therapist, Burlene Arnt, LICSW, the possibiilty of  participating in a DBT group.     Risk plan (for patients at moderate/high risk for suicide/violence/addiction): Patient not at moderate or high risk.    Next visit: patient to be seen in           Amount of time spent w/patient today: 45 min.      Darcus Pester, LIPhD

## 2007-09-28 ENCOUNTER — Ambulatory Visit (HOSPITAL_BASED_OUTPATIENT_CLINIC_OR_DEPARTMENT_OTHER): Payer: MEDICARE | Admitting: Clinical

## 2007-09-28 DIAGNOSIS — F331 Major depressive disorder, recurrent, moderate: Secondary | ICD-10-CM

## 2007-09-28 DIAGNOSIS — F449 Dissociative and conversion disorder, unspecified: Secondary | ICD-10-CM
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
## 2007-10-05 ENCOUNTER — Ambulatory Visit (HOSPITAL_BASED_OUTPATIENT_CLINIC_OR_DEPARTMENT_OTHER): Payer: MEDICARE | Admitting: Clinical

## 2007-10-05 DIAGNOSIS — F331 Major depressive disorder, recurrent, moderate: Principal | ICD-10-CM

## 2007-10-05 DIAGNOSIS — F449 Dissociative and conversion disorder, unspecified: Secondary | ICD-10-CM
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
## 2007-10-07 ENCOUNTER — Ambulatory Visit (HOSPITAL_BASED_OUTPATIENT_CLINIC_OR_DEPARTMENT_OTHER): Payer: MEDICARE | Admitting: Podiatrist

## 2007-10-07 LAB — XR FOOT RIGHT MINIMUM 3 VIEWS

## 2007-10-12 ENCOUNTER — Ambulatory Visit (HOSPITAL_BASED_OUTPATIENT_CLINIC_OR_DEPARTMENT_OTHER): Payer: MEDICARE | Admitting: Clinical

## 2007-10-12 DIAGNOSIS — F331 Major depressive disorder, recurrent, moderate: Secondary | ICD-10-CM

## 2007-10-12 DIAGNOSIS — F449 Dissociative and conversion disorder, unspecified: Secondary | ICD-10-CM
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
## 2007-10-19 ENCOUNTER — Ambulatory Visit (HOSPITAL_BASED_OUTPATIENT_CLINIC_OR_DEPARTMENT_OTHER): Payer: MEDICARE | Admitting: Clinical

## 2007-10-19 DIAGNOSIS — F449 Dissociative and conversion disorder, unspecified: Secondary | ICD-10-CM

## 2007-10-19 DIAGNOSIS — F331 Major depressive disorder, recurrent, moderate: Secondary | ICD-10-CM
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
## 2007-10-26 ENCOUNTER — Ambulatory Visit (HOSPITAL_BASED_OUTPATIENT_CLINIC_OR_DEPARTMENT_OTHER): Payer: MEDICARE | Admitting: Clinical

## 2007-10-26 DIAGNOSIS — F449 Dissociative and conversion disorder, unspecified: Secondary | ICD-10-CM

## 2007-10-26 DIAGNOSIS — F331 Major depressive disorder, recurrent, moderate: Secondary | ICD-10-CM
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
## 2007-10-27 ENCOUNTER — Ambulatory Visit (HOSPITAL_BASED_OUTPATIENT_CLINIC_OR_DEPARTMENT_OTHER): Payer: MEDICARE | Admitting: Psychologist

## 2007-10-27 DIAGNOSIS — F449 Dissociative and conversion disorder, unspecified: Secondary | ICD-10-CM

## 2007-10-27 DIAGNOSIS — F331 Major depressive disorder, recurrent, moderate: Principal | ICD-10-CM
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
## 2007-10-29 LAB — SURG SPEC CLINIC NOTE

## 2007-11-02 ENCOUNTER — Ambulatory Visit (HOSPITAL_BASED_OUTPATIENT_CLINIC_OR_DEPARTMENT_OTHER): Payer: MEDICARE | Admitting: Clinical

## 2007-11-02 DIAGNOSIS — F331 Major depressive disorder, recurrent, moderate: Secondary | ICD-10-CM

## 2007-11-02 DIAGNOSIS — F449 Dissociative and conversion disorder, unspecified: Secondary | ICD-10-CM
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
## 2007-11-05 ENCOUNTER — Ambulatory Visit (HOSPITAL_BASED_OUTPATIENT_CLINIC_OR_DEPARTMENT_OTHER): Payer: MEDICARE | Admitting: Psychologist

## 2007-11-05 ENCOUNTER — Ambulatory Visit (HOSPITAL_BASED_OUTPATIENT_CLINIC_OR_DEPARTMENT_OTHER): Payer: Self-pay | Admitting: General Psych-Thurs Team (PRV Practice 10)

## 2007-11-05 ENCOUNTER — Ambulatory Visit (HOSPITAL_BASED_OUTPATIENT_CLINIC_OR_DEPARTMENT_OTHER): Payer: MEDICARE | Admitting: General Psych-Thurs Team (PRV Practice 10)

## 2007-11-05 DIAGNOSIS — F331 Major depressive disorder, recurrent, moderate: Principal | ICD-10-CM

## 2007-11-05 DIAGNOSIS — F449 Dissociative and conversion disorder, unspecified: Secondary | ICD-10-CM
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
## 2007-11-05 NOTE — Progress Notes (Signed)
OUTPATIENT PSYCHIATRY GROUP PROGRESS NOTE    Patient Name: Cenia Zaragosa [facility] SESSION]    Group Name: [facility]BT    Leaders: Ander Slade, RN    Service Type: [phone] Group Psychotherapy     Length of Group: 90 minutes           Purpose of Group (choose all that apply):   Affect regulation         Group Process:Group went over CM [employer] and What skills.       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     Risk Level per Scale:  Suicide: low  Violence: low  Addiction: low    Current risk level represents increase in risk: No.                                                   [name], PHD LICSW    11/05/07

## 2007-11-09 ENCOUNTER — Ambulatory Visit (HOSPITAL_BASED_OUTPATIENT_CLINIC_OR_DEPARTMENT_OTHER): Payer: MEDICARE | Admitting: Clinical

## 2007-11-09 DIAGNOSIS — F331 Major depressive disorder, recurrent, moderate: Secondary | ICD-10-CM

## 2007-11-09 DIAGNOSIS — F449 Dissociative and conversion disorder, unspecified: Secondary | ICD-10-CM
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
## 2007-11-12 ENCOUNTER — Telehealth (HOSPITAL_BASED_OUTPATIENT_CLINIC_OR_DEPARTMENT_OTHER): Payer: Self-pay

## 2007-11-12 ENCOUNTER — Ambulatory Visit (HOSPITAL_BASED_OUTPATIENT_CLINIC_OR_DEPARTMENT_OTHER): Payer: MEDICARE | Admitting: General Psych-Thurs Team (PRV Practice 10)

## 2007-11-12 ENCOUNTER — Ambulatory Visit (HOSPITAL_BASED_OUTPATIENT_CLINIC_OR_DEPARTMENT_OTHER): Payer: MEDICARE | Admitting: Psychologist

## 2007-11-12 DIAGNOSIS — F331 Major depressive disorder, recurrent, moderate: Secondary | ICD-10-CM

## 2007-11-12 DIAGNOSIS — F449 Dissociative and conversion disorder, unspecified: Secondary | ICD-10-CM

## 2007-11-12 NOTE — Progress Notes (Signed)
OUTPATIENT PSYCHIATRY GROUP PROGRESS NOTE    Patient Name: Brianna [name] [facility] SESSION]    Group Name: [facility]BT    Leaders: Ander Slade, RN    Service Type: [phone] Group Psychotherapy     Length of Group: 90 minutes           Purpose of Group (choose all that apply):   Affect regulation         Group Process: Group went over CM How.      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     Risk Level per Scale:  Suicide: low  Violence: low  Addiction: low    Current risk level represents increase in risk: No.                                                   Brianna Pollen, PHD LICSW    11/12/07

## 2007-11-12 NOTE — Telephone Encounter (Signed)
Staff Message copied by Forbes Cellar on Fri Nov 12, 2007 10:21 AM  ------   Message from: Wilder Glade   Created: Fri Nov 12, 2007 10:15 AM   Regarding: question   Contact: [phone]    Patient is intrested in dicussing some testing. Would lke to speak with the Dr before her appt.    Please call  [phone]
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
## 2007-11-12 NOTE — Telephone Encounter (Signed)
Brianna [name] spoke with pt she states ongoing fatique problems -- she would like to talk to you regarding "adrenal studies"-I was reading that this should be looked @ for fatigue matters."  Pt has a scheduled appointment with you 11/26/07.   FYI

## 2007-11-16 ENCOUNTER — Ambulatory Visit (HOSPITAL_BASED_OUTPATIENT_CLINIC_OR_DEPARTMENT_OTHER): Payer: MEDICARE | Admitting: Clinical

## 2007-11-16 DIAGNOSIS — F449 Dissociative and conversion disorder, unspecified: Secondary | ICD-10-CM

## 2007-11-16 DIAGNOSIS — F331 Major depressive disorder, recurrent, moderate: Principal | ICD-10-CM
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
## 2007-11-17 ENCOUNTER — Encounter (HOSPITAL_BASED_OUTPATIENT_CLINIC_OR_DEPARTMENT_OTHER): Payer: Self-pay | Admitting: Clinical

## 2007-11-19 ENCOUNTER — Ambulatory Visit (HOSPITAL_BASED_OUTPATIENT_CLINIC_OR_DEPARTMENT_OTHER): Payer: MEDICARE | Admitting: General Psych-Thurs Team (PRV Practice 10)

## 2007-11-19 DIAGNOSIS — F331 Major depressive disorder, recurrent, moderate: Secondary | ICD-10-CM

## 2007-11-19 DIAGNOSIS — F4310 Post-traumatic stress disorder, unspecified: Secondary | ICD-10-CM

## 2007-11-19 NOTE — Progress Notes (Signed)
OUTPATIENT PSYCHIATRY GROUP PROGRESS NOTE    Patient Name: Jill Stopka [facility] SESSION]    Group Name: [facility]    Leaders: Ander Slade, RN    Service Type: [phone] Group Psychotherapy     Length of Group: 90 minutes           Purpose of Group (choose all that apply):   Affect regulation         Group Process:Group went over DT Graph & Distracting.       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     Risk Level per Scale:  Suicide: low  Violence: low  Addiction: low    Current risk level represents increase in risk: No.                                                   [name], PHD LICSW    11/19/07

## 2007-11-23 ENCOUNTER — Ambulatory Visit (HOSPITAL_BASED_OUTPATIENT_CLINIC_OR_DEPARTMENT_OTHER): Payer: MEDICARE | Admitting: Clinical

## 2007-11-23 DIAGNOSIS — F449 Dissociative and conversion disorder, unspecified: Secondary | ICD-10-CM

## 2007-11-23 DIAGNOSIS — F4310 Post-traumatic stress disorder, unspecified: Secondary | ICD-10-CM

## 2007-11-23 DIAGNOSIS — F331 Major depressive disorder, recurrent, moderate: Principal | ICD-10-CM
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
## 2007-11-26 ENCOUNTER — Ambulatory Visit (HOSPITAL_BASED_OUTPATIENT_CLINIC_OR_DEPARTMENT_OTHER): Payer: MEDICARE | Admitting: General Psych-Thurs Team (PRV Practice 10)

## 2007-11-26 ENCOUNTER — Ambulatory Visit (HOSPITAL_BASED_OUTPATIENT_CLINIC_OR_DEPARTMENT_OTHER): Payer: MEDICARE | Admitting: Internal Medicine

## 2007-11-26 VITALS — BP 110/70 | Wt 114.0 lb

## 2007-11-26 DIAGNOSIS — F4310 Post-traumatic stress disorder, unspecified: Secondary | ICD-10-CM

## 2007-11-26 DIAGNOSIS — F449 Dissociative and conversion disorder, unspecified: Secondary | ICD-10-CM

## 2007-11-26 DIAGNOSIS — F331 Major depressive disorder, recurrent, moderate: Secondary | ICD-10-CM

## 2007-11-26 DIAGNOSIS — Z124 Encounter for screening for malignant neoplasm of cervix: Principal | ICD-10-CM

## 2007-11-26 DIAGNOSIS — R5383 Other fatigue: Secondary | ICD-10-CM

## 2007-11-26 LAB — CHG LIPID PANEL
Cholesterol: 205 mg/dl — ABNORMAL HIGH (ref 0–200)
HIGH DENSITY LIPOPROTEIN: 47 mg/dl (ref 35–85)
LOW DENSITY LIPOPROTEIN DIRECT: 139 mg/dl — ABNORMAL HIGH (ref 0–100)
RISK FACTOR: 4.4 (ref ?–4.4)
TRIGLYCERIDES: 89 mg/dl (ref 0–150)

## 2007-11-26 LAB — BASIC METABOLIC PANEL
ANION GAP: 7 mmol/L (ref 2–25)
BUN (UREA NITROGEN): 13 mg/dl (ref 6–20)
CALCIUM: 9.4 mg/dl (ref 8.6–10.0)
CARBON DIOXIDE: 30 mmol/L (ref 22–32)
CHLORIDE: 105 mmol/L (ref 101–111)
CREATININE: 0.7 mg/dl (ref 0.4–1.2)
ESTIMATED GLOMERULAR FILT RATE: >60 mL/min (ref 60–116)
Glucose Random: 79 mg/dl (ref 74–160)
POTASSIUM: 4.7 mmol/L (ref 3.5–5.1)
SODIUM: 142 mmol/L (ref 135–144)

## 2007-11-26 LAB — BLOOD COUNT COMPLETE AUTOMATED
HEMATOCRIT: 41.3 % (ref 36.0–48.0)
HEMOGLOBIN: 13.9 g/dl (ref 12.0–16.0)
MEAN CORP HGB CONC: 33.7 g/dl (ref 32.0–36.0)
MEAN CORPUSCULAR HGB: 31.9 pg (ref 27.0–33.0)
MEAN CORPUSCULAR VOL: 94.8 fl (ref 80.0–100.0)
MEAN PLATELET VOLUME: 9.3 fl (ref 6.4–10.8)
PLATELET COUNT: 242 10*3/uL (ref 150–400)
RBC DISTRIBUTION WIDTH: 13.2 % (ref 11.5–14.3)
RED BLOOD CELL COUNT: 4.35 M/uL — ABNORMAL LOW (ref 4.50–5.10)
WHITE BLOOD CELL COUNT: 4.2 10*3/uL (ref 4.0–10.8)

## 2007-11-26 LAB — THYROID SCREEN TSH REFLEX FT4: THYROID SCREEN TSH REFLEX FT4: 0.58 u[IU]/mL (ref 0.34–5.60)

## 2007-11-26 LAB — CORTISOL AM: CORTISOL AM: 7 ug/dl (ref 4.3–22.4)

## 2007-11-26 LAB — CHG ASSAY OF FERRITIN: FERRITIN: 28 ng/mL (ref 11–307)

## 2007-11-26 LAB — CYANOCOBALAMIN VITAMIN B-12: VITAMIN B12: 297 pg/mL (ref 180–914)

## 2007-11-26 LAB — TRANSFERASE ALANINE AMINO ALT SGPT: ALANINE AMINOTRANSFERASE: 19 IU/L (ref 7–35)

## 2007-11-26 NOTE — Progress Notes (Signed)
OUTPATIENT PSYCHIATRY GROUP PROGRESS NOTE    Patient Name: Brianna [name] [facility] SESSION]    Group Name: [facility]    Leaders: Ander Slade, RN    Service Type: [phone] Group Psychotherapy     Length of Group: 90 minutes           Purpose of Group (choose all that apply):   Affect regulation         Group Process: Group went over DT self soothe.    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     Risk Level per Scale:  Suicide: low  Violence: low  Addiction: low    Current risk level represents increase in risk: No.                                                   [name], PHD LICSW    11/26/07

## 2007-11-26 NOTE — Progress Notes (Signed)
Brianna [name] is a 52 year old w  Here for cpe.  Chief concerns: fatigue, no energy, feeling just bad and down all the time despite being in tx with psychotherapist and psychiatrist.  Brianna [name] about whether the problem is under-performing adrenal glands, shd these be tested.  What do I think about DHEA?  Has been reluctant to take antidepressant meds. She has no particular sx's on ROS of neuro, HEENT, CV, pulm, GI, GU, or musculoskeletal areas.    OBJECTIVE:   BP 110/70  Wt 114 lb (51.71 kg)  LMP Postmenopausal (LMP '04)  Alert, ambulatory, attentive.  Affect = very sad, = her baseline.  Weeps several times during visit.  Emotional range, interaction, thought processing - nl.  HEENT: Scalp normal. Conjunctivae and sclerae are normal.  Oropharynx normal.  Neck is supple without masses, no cervical lymphadenopathy.   Back without deformity.  Chest is clear to auscultation.   Heart sounds: normal S1 and S2, regular rate and rhythm, no murmurs.  Breasts: normal without suspicious masses, skin or nipple changes or axillary nodes.   Abdomen is soft, no tenderness or masses, no appreciable organomegaly.    Pelvic: No inguinal lymphadenopathy.  No lesions observed on mons, inner thighs, perineum. Labia majora and minora - nl/atrophy. Urethral meatus normal.   Introduction of pedi-size speculum very uncomfortable for her - signif atrophic vaginitis. Able to visualize cx which appears nl by manipulating uterus through abd wall.  Bimanual exam: limited by discomfort. Uterus normal in size and shape without tenderness.  Adnexae normal in size without any appreciated masses.  Extremities are normal, without edema.   Skin: normal without concerning lesions noted.     ASSESSMENT/PLAN:   Cpe done, mammo 01/31/08  Re: sampling cervix: difficult - attempted pap, ECC's, HPV today using pedi speculum - if still insufficienct, consider re-try in 1 yr's time with 2 months pre-tx with Estring etc.  Very low risk.  Labs done - screen  for various causes of fatigue.  Long discussion with Brianna [name] about what it is like for her to live with a significant, disabling, chronic illness (MDD with strong genetic component) when the illness is invisible to others. She voices often feelings of "there must be something [unidentified] wrong with me" and "why can't they figure out what's wrong" and "what am I doing wrong" - great difficulty identifying that these might not be accurate assessments.  Cc to her therapist/psychiatrist.  I am guessing that she has been very resistant to medication trials.  Return to clinic for next annual or sooner if needed.   Addendum: results all nl - nothing found by blood testing re: her fatigue.    Counselling-oriented visit, 45 minutes spent face-to-face with patient, of which greater than 50% was direct counselling re: her understanding of living with chronic depressive disorder, options she might consider natural/alternative remedies she might want to try.

## 2007-11-28 ENCOUNTER — Encounter (HOSPITAL_BASED_OUTPATIENT_CLINIC_OR_DEPARTMENT_OTHER): Payer: Self-pay | Admitting: Internal Medicine

## 2007-11-30 ENCOUNTER — Ambulatory Visit (HOSPITAL_BASED_OUTPATIENT_CLINIC_OR_DEPARTMENT_OTHER): Payer: MEDICARE | Admitting: Clinical

## 2007-11-30 DIAGNOSIS — E559 Vitamin D deficiency, unspecified: Secondary | ICD-10-CM | POA: Insufficient documentation

## 2007-11-30 LAB — VITAMIN D,25 HYDROXY: VITAMIN D,25 HYDROXY: 7.5 ng/mL — AB (ref 32.0–100.0)

## 2007-11-30 MED ORDER — VITAMIN D 50000 UNIT OR CAPS
ORAL_CAPSULE | ORAL | Status: AC
Start: 2007-11-30 — End: 2008-07-31

## 2007-11-30 NOTE — Progress Notes (Addendum)
Addended by: Loann Quill on: 11/30/2007 6:49:29 PM     Modules accepted: Orders

## 2007-12-01 ENCOUNTER — Ambulatory Visit (HOSPITAL_BASED_OUTPATIENT_CLINIC_OR_DEPARTMENT_OTHER): Payer: MEDICARE | Admitting: Clinical
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
## 2007-12-03 ENCOUNTER — Ambulatory Visit (HOSPITAL_BASED_OUTPATIENT_CLINIC_OR_DEPARTMENT_OTHER): Payer: MEDICARE | Admitting: General Psych-Thurs Team (PRV Practice 10)

## 2007-12-06 ENCOUNTER — Ambulatory Visit (HOSPITAL_BASED_OUTPATIENT_CLINIC_OR_DEPARTMENT_OTHER): Payer: MEDICARE | Admitting: Psychologist

## 2007-12-06 DIAGNOSIS — F449 Dissociative and conversion disorder, unspecified: Secondary | ICD-10-CM

## 2007-12-06 DIAGNOSIS — F331 Major depressive disorder, recurrent, moderate: Secondary | ICD-10-CM

## 2007-12-06 DIAGNOSIS — F4310 Post-traumatic stress disorder, unspecified: Secondary | ICD-10-CM
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
## 2007-12-07 ENCOUNTER — Ambulatory Visit (HOSPITAL_BASED_OUTPATIENT_CLINIC_OR_DEPARTMENT_OTHER): Payer: MEDICARE | Admitting: Clinical

## 2007-12-07 DIAGNOSIS — F331 Major depressive disorder, recurrent, moderate: Secondary | ICD-10-CM

## 2007-12-07 DIAGNOSIS — F449 Dissociative and conversion disorder, unspecified: Secondary | ICD-10-CM

## 2007-12-07 DIAGNOSIS — F4310 Post-traumatic stress disorder, unspecified: Secondary | ICD-10-CM

## 2007-12-07 LAB — HUMAN PAPILLOMAVIRUS (HPV): HUMAN PAPILLOMAVIRUS: NEGATIVE
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
## 2007-12-10 ENCOUNTER — Ambulatory Visit (HOSPITAL_BASED_OUTPATIENT_CLINIC_OR_DEPARTMENT_OTHER): Payer: MEDICARE | Admitting: Registered Nurse

## 2007-12-13 ENCOUNTER — Ambulatory Visit (HOSPITAL_BASED_OUTPATIENT_CLINIC_OR_DEPARTMENT_OTHER): Payer: MEDICARE | Admitting: Clinical

## 2007-12-13 DIAGNOSIS — F4310 Post-traumatic stress disorder, unspecified: Principal | ICD-10-CM
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
## 2007-12-14 ENCOUNTER — Ambulatory Visit (HOSPITAL_BASED_OUTPATIENT_CLINIC_OR_DEPARTMENT_OTHER): Payer: MEDICARE | Admitting: Podiatrist

## 2007-12-14 ENCOUNTER — Ambulatory Visit (HOSPITAL_BASED_OUTPATIENT_CLINIC_OR_DEPARTMENT_OTHER): Payer: MEDICARE | Admitting: Clinical

## 2007-12-14 DIAGNOSIS — F4310 Post-traumatic stress disorder, unspecified: Secondary | ICD-10-CM
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
## 2007-12-16 LAB — CYTOPATH, C/V, THIN LAYER

## 2007-12-17 ENCOUNTER — Ambulatory Visit (HOSPITAL_BASED_OUTPATIENT_CLINIC_OR_DEPARTMENT_OTHER): Payer: MEDICARE | Admitting: Registered Nurse

## 2007-12-17 DIAGNOSIS — F4310 Post-traumatic stress disorder, unspecified: Secondary | ICD-10-CM

## 2007-12-20 ENCOUNTER — Ambulatory Visit (HOSPITAL_BASED_OUTPATIENT_CLINIC_OR_DEPARTMENT_OTHER): Payer: MEDICARE | Admitting: Clinical

## 2007-12-20 DIAGNOSIS — F4310 Post-traumatic stress disorder, unspecified: Secondary | ICD-10-CM
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
## 2007-12-21 ENCOUNTER — Ambulatory Visit (HOSPITAL_BASED_OUTPATIENT_CLINIC_OR_DEPARTMENT_OTHER): Payer: MEDICARE | Admitting: Clinical

## 2007-12-21 DIAGNOSIS — F4310 Post-traumatic stress disorder, unspecified: Principal | ICD-10-CM

## 2007-12-21 DIAGNOSIS — F331 Major depressive disorder, recurrent, moderate: Secondary | ICD-10-CM

## 2007-12-21 LAB — SURG SPEC CLINIC NOTE
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
## 2007-12-21 NOTE — Progress Notes (Signed)
OUTPATIENT PSYCHIATRY GROUP PROGRESS NOTE    Patient Name: Brianna [name]     Group Name: [facility]    Leaders: Algis Liming, RN,CS    Service Type: [phone] Group Psychotherapy     Length of Group: 90 minutes           Purpose of Group (choose all that apply):   Affect regulation         Group Process:  Group went over DT "Improve the Moment crisis survival strategies" homework and introduced "Thinking of Pros and Cons".    Individual Patient Participation:    Active participant    Diagnosis (addressed by this group):300.15, R/O Dissociative D/O NOS  R/O 296.32, MDD Recurrent, Moderate; R/O 300, Anxiety D/O NOS    Medical Necessity of Session (how treatment is necessary to improve symptoms, functioning, or prevent worsening): learn to regulate affect    Relevant Changes in Mental Status: No.     Risk Level per Scale:  Suicide: low  Violence: low  Addiction: low    Current risk level represents increase in risk: No.                                                   Edwyna Perfect, RN,CS  12/17/07

## 2007-12-24 ENCOUNTER — Ambulatory Visit (HOSPITAL_BASED_OUTPATIENT_CLINIC_OR_DEPARTMENT_OTHER): Payer: MEDICARE | Admitting: General Psych-Thurs Team (PRV Practice 10)

## 2007-12-24 DIAGNOSIS — F331 Major depressive disorder, recurrent, moderate: Secondary | ICD-10-CM

## 2007-12-24 DIAGNOSIS — F4310 Post-traumatic stress disorder, unspecified: Secondary | ICD-10-CM

## 2007-12-24 NOTE — Progress Notes (Signed)
OUTPATIENT PSYCHIATRY GROUP PROGRESS NOTE    Patient Name: Mazal Ebey [facility] SESSION]    Group Name: [facility]    Leaders: Ander Slade, RN    Service Type: [phone] Group Psychotherapy     Length of Group: 90 minutes           Purpose of Group (choose all that apply):   Affect regulation         Group Process:   Group went over DT Breathing.    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     Risk Level per Scale:  Suicide: low  Violence: low  Addiction: low    Current risk level represents increase in risk: No.                                                   [name], PHD LICSW    12/24/07

## 2007-12-27 ENCOUNTER — Ambulatory Visit (HOSPITAL_BASED_OUTPATIENT_CLINIC_OR_DEPARTMENT_OTHER): Payer: MEDICARE | Admitting: Clinical

## 2007-12-27 DIAGNOSIS — F4310 Post-traumatic stress disorder, unspecified: Principal | ICD-10-CM

## 2007-12-27 DIAGNOSIS — F331 Major depressive disorder, recurrent, moderate: Secondary | ICD-10-CM
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
## 2007-12-28 ENCOUNTER — Ambulatory Visit (HOSPITAL_BASED_OUTPATIENT_CLINIC_OR_DEPARTMENT_OTHER): Payer: MEDICARE | Admitting: Clinical

## 2007-12-28 DIAGNOSIS — F331 Major depressive disorder, recurrent, moderate: Secondary | ICD-10-CM

## 2007-12-28 DIAGNOSIS — F4310 Post-traumatic stress disorder, unspecified: Secondary | ICD-10-CM
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
## 2007-12-31 ENCOUNTER — Ambulatory Visit (HOSPITAL_BASED_OUTPATIENT_CLINIC_OR_DEPARTMENT_OTHER): Payer: MEDICARE | Admitting: General Psych-Thurs Team (PRV Practice 10)

## 2007-12-31 DIAGNOSIS — F331 Major depressive disorder, recurrent, moderate: Secondary | ICD-10-CM

## 2007-12-31 DIAGNOSIS — F4310 Post-traumatic stress disorder, unspecified: Secondary | ICD-10-CM

## 2007-12-31 NOTE — Progress Notes (Signed)
OUTPATIENT PSYCHIATRY GROUP PROGRESS NOTE    Patient Name: Brianna [name] [facility] SESSION]    Group Name: [facility]    Leaders: Ander Slade, RN    Service Type: [phone] Group Psychotherapy     Length of Group: 90 minutes           Purpose of Group (choose all that apply):   Affect regulation         Group Process: Group went over DT 1/2 Sm[employer] and Awareness.    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     Risk Level per Scale:  Suicide: low  Violence: low  Addiction: low    Current risk level represents increase in risk: No.                                                   [name], PHD LICSW    12/31/07

## 2008-01-04 ENCOUNTER — Ambulatory Visit (HOSPITAL_BASED_OUTPATIENT_CLINIC_OR_DEPARTMENT_OTHER): Payer: MEDICARE | Admitting: Clinical

## 2008-01-04 DIAGNOSIS — F331 Major depressive disorder, recurrent, moderate: Secondary | ICD-10-CM

## 2008-01-04 DIAGNOSIS — F4310 Post-traumatic stress disorder, unspecified: Principal | ICD-10-CM
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
## 2008-01-05 ENCOUNTER — Encounter (HOSPITAL_BASED_OUTPATIENT_CLINIC_OR_DEPARTMENT_OTHER): Payer: Self-pay | Admitting: Clinical

## 2008-01-07 ENCOUNTER — Ambulatory Visit (HOSPITAL_BASED_OUTPATIENT_CLINIC_OR_DEPARTMENT_OTHER): Payer: MEDICARE | Admitting: General Psych-Thurs Team (PRV Practice 10)

## 2008-01-07 DIAGNOSIS — F331 Major depressive disorder, recurrent, moderate: Secondary | ICD-10-CM

## 2008-01-07 DIAGNOSIS — F4310 Post-traumatic stress disorder, unspecified: Principal | ICD-10-CM

## 2008-01-07 NOTE — Progress Notes (Signed)
OUTPATIENT PSYCHIATRY GROUP PROGRESS NOTE    Patient Name: [name] SESSION]    Group Name: [facility]    Leaders: Ander Slade, RN    Service Type: [id number] Group Psychotherapy     Length of Group: 90 minutes           Purpose of Group (choose all that apply):   Affect regulation         Group Process:Group went over DT Radical Acceptance.     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     Risk Level per Scale:  Suicide: low  Violence: low  Addiction: low    Current risk level represents increase in risk: No.                                                   [name], PHD LICSW    01/07/08

## 2008-01-10 ENCOUNTER — Ambulatory Visit (HOSPITAL_BASED_OUTPATIENT_CLINIC_OR_DEPARTMENT_OTHER): Payer: MEDICARE | Admitting: Clinical

## 2008-01-10 DIAGNOSIS — F4310 Post-traumatic stress disorder, unspecified: Secondary | ICD-10-CM

## 2008-01-10 DIAGNOSIS — F331 Major depressive disorder, recurrent, moderate: Principal | ICD-10-CM
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
## 2008-01-11 ENCOUNTER — Ambulatory Visit (HOSPITAL_BASED_OUTPATIENT_CLINIC_OR_DEPARTMENT_OTHER): Payer: MEDICARE | Admitting: Clinical

## 2008-01-11 DIAGNOSIS — F331 Major depressive disorder, recurrent, moderate: Secondary | ICD-10-CM

## 2008-01-11 DIAGNOSIS — F4310 Post-traumatic stress disorder, unspecified: Secondary | ICD-10-CM
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
## 2008-01-14 ENCOUNTER — Ambulatory Visit (HOSPITAL_BASED_OUTPATIENT_CLINIC_OR_DEPARTMENT_OTHER): Payer: MEDICARE | Admitting: General Psych-Thurs Team (PRV Practice 10)

## 2008-01-14 DIAGNOSIS — F4310 Post-traumatic stress disorder, unspecified: Secondary | ICD-10-CM

## 2008-01-14 DIAGNOSIS — F331 Major depressive disorder, recurrent, moderate: Principal | ICD-10-CM

## 2008-01-14 NOTE — Progress Notes (Signed)
OUTPATIENT PSYCHIATRY GROUP PROGRESS NOTE    Patient Name: Brianna [name] SESSION]    Group Name: [facility]    Leaders: Ander Slade, RN    Service Type: [phone] Group Psychotherapy     Length of Group: 90 minutes           Purpose of Group (choose all that apply):   Affect regulation         Group Process:Group went over DT Willingness Willfulness.     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     Risk Level per Scale:  Suicide: low  Violence: low  Addiction: low    Current risk level represents increase in risk: No.                                                   Brianna Pollen, PHD LICSW   01/14/08

## 2008-01-17 ENCOUNTER — Ambulatory Visit (HOSPITAL_BASED_OUTPATIENT_CLINIC_OR_DEPARTMENT_OTHER): Payer: MEDICARE | Admitting: Clinical

## 2008-01-17 DIAGNOSIS — F4310 Post-traumatic stress disorder, unspecified: Secondary | ICD-10-CM

## 2008-01-17 DIAGNOSIS — F331 Major depressive disorder, recurrent, moderate: Principal | ICD-10-CM
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
## 2008-01-18 ENCOUNTER — Ambulatory Visit (HOSPITAL_BASED_OUTPATIENT_CLINIC_OR_DEPARTMENT_OTHER): Payer: MEDICARE | Admitting: Clinical

## 2008-01-18 DIAGNOSIS — F4310 Post-traumatic stress disorder, unspecified: Secondary | ICD-10-CM

## 2008-01-18 DIAGNOSIS — F331 Major depressive disorder, recurrent, moderate: Secondary | ICD-10-CM
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
## 2008-01-21 ENCOUNTER — Ambulatory Visit (HOSPITAL_BASED_OUTPATIENT_CLINIC_OR_DEPARTMENT_OTHER): Payer: MEDICARE | Admitting: General Psych-Thurs Team (PRV Practice 10)

## 2008-01-24 ENCOUNTER — Ambulatory Visit (HOSPITAL_BASED_OUTPATIENT_CLINIC_OR_DEPARTMENT_OTHER): Payer: MEDICARE | Admitting: Clinical

## 2008-01-24 DIAGNOSIS — F4310 Post-traumatic stress disorder, unspecified: Secondary | ICD-10-CM

## 2008-01-24 DIAGNOSIS — F331 Major depressive disorder, recurrent, moderate: Secondary | ICD-10-CM
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
## 2008-01-25 ENCOUNTER — Telehealth (HOSPITAL_BASED_OUTPATIENT_CLINIC_OR_DEPARTMENT_OTHER): Payer: Self-pay | Admitting: Registered Nurse

## 2008-01-25 ENCOUNTER — Ambulatory Visit (HOSPITAL_BASED_OUTPATIENT_CLINIC_OR_DEPARTMENT_OTHER): Payer: MEDICARE | Admitting: Clinical

## 2008-01-25 ENCOUNTER — Ambulatory Visit (HOSPITAL_BASED_OUTPATIENT_CLINIC_OR_DEPARTMENT_OTHER): Payer: Self-pay | Admitting: Dentist

## 2008-01-25 NOTE — Telephone Encounter (Signed)
Staff Message copied by Noreene Larsson on Tue Jan 25, 2008 3:46 PM  ------   Message from: Janet Berlin   Created: Tue Jan 25, 2008 12:01 PM   Regarding: cough/appt    C/o sore thraot, runny nose and cough ? Swine flu. Wondering if needs to be seen. [phone].

## 2008-01-25 NOTE — Telephone Encounter (Signed)
SORE THROAT    Brianna [name], 52 year old, female    Patient calls complaining of Questions    for 2-3 days, without fever of not sure of temp degrees F/C.    If the answer to the following questions is YES, a provider visit is indicated unless provider orders an alternative treatment plan.    If the Patient requests to be seen a provider visit should be scheduled.    Are any of the following symptoms present? swollen or tender neck glands.    Additional assessment questions:    Have you had any close contact with a person infected with proven   strep throat? no  Do you have any cough? yes   Running nose or sneezing? yes  Head congestion? yes  Are you taking any non-prescription medications for your symptoms? yes  If yes, what type of medication? nasal spray, combination product, Acetaminophen and Ibuprofen    TELEPHONE / HOME CARE ADVICE:   May use non-prescription drugs such as Acetaminophen and Ibuprofen for pain relief.  Non-prescription throat lozenges may relieve minor throat discomfort.  Gargle with salt water.  Humidify air.  Increase fluid intake to at least eight (8) glasses a day.  If antibiotics are prescribed be sure to complete the entire course.  Call the clinc if symptoms persist after treatment is completed or sooner if condition worsening.

## 2008-01-28 ENCOUNTER — Ambulatory Visit (HOSPITAL_BASED_OUTPATIENT_CLINIC_OR_DEPARTMENT_OTHER): Payer: MEDICARE | Admitting: General Psych-Thurs Team (PRV Practice 10)

## 2008-01-31 ENCOUNTER — Ambulatory Visit (HOSPITAL_BASED_OUTPATIENT_CLINIC_OR_DEPARTMENT_OTHER): Payer: Self-pay | Admitting: Internal Medicine

## 2008-01-31 ENCOUNTER — Ambulatory Visit (HOSPITAL_BASED_OUTPATIENT_CLINIC_OR_DEPARTMENT_OTHER): Payer: MEDICARE | Admitting: Clinical

## 2008-01-31 DIAGNOSIS — F331 Major depressive disorder, recurrent, moderate: Secondary | ICD-10-CM

## 2008-01-31 DIAGNOSIS — F4310 Post-traumatic stress disorder, unspecified: Secondary | ICD-10-CM

## 2008-01-31 LAB — MA SCREENING MAMMO BILATERAL WITH CAD
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
## 2008-02-01 ENCOUNTER — Ambulatory Visit (HOSPITAL_BASED_OUTPATIENT_CLINIC_OR_DEPARTMENT_OTHER): Payer: MEDICARE | Admitting: Clinical

## 2008-02-01 DIAGNOSIS — F331 Major depressive disorder, recurrent, moderate: Secondary | ICD-10-CM

## 2008-02-01 DIAGNOSIS — F4310 Post-traumatic stress disorder, unspecified: Secondary | ICD-10-CM
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
## 2008-02-04 ENCOUNTER — Ambulatory Visit (HOSPITAL_BASED_OUTPATIENT_CLINIC_OR_DEPARTMENT_OTHER): Payer: MEDICARE | Admitting: General Psych-Thurs Team (PRV Practice 10)

## 2008-02-04 DIAGNOSIS — F4310 Post-traumatic stress disorder, unspecified: Secondary | ICD-10-CM

## 2008-02-04 DIAGNOSIS — F331 Major depressive disorder, recurrent, moderate: Principal | ICD-10-CM

## 2008-02-04 NOTE — Progress Notes (Signed)
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     Risk Level per Scale:  Suicide: low  Violence: low  Addiction: low    Current risk level represents increase in risk: No.                                                   [name], PHD LICSW   02/04/08

## 2008-02-07 ENCOUNTER — Ambulatory Visit (HOSPITAL_BASED_OUTPATIENT_CLINIC_OR_DEPARTMENT_OTHER): Payer: MEDICARE | Admitting: Clinical

## 2008-02-07 DIAGNOSIS — F4310 Post-traumatic stress disorder, unspecified: Principal | ICD-10-CM

## 2008-02-07 DIAGNOSIS — F331 Major depressive disorder, recurrent, moderate: Secondary | ICD-10-CM
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
## 2008-02-08 ENCOUNTER — Ambulatory Visit (HOSPITAL_BASED_OUTPATIENT_CLINIC_OR_DEPARTMENT_OTHER): Payer: MEDICARE | Admitting: Clinical

## 2008-02-08 DIAGNOSIS — F4310 Post-traumatic stress disorder, unspecified: Secondary | ICD-10-CM

## 2008-02-08 DIAGNOSIS — F449 Dissociative and conversion disorder, unspecified: Secondary | ICD-10-CM

## 2008-02-08 DIAGNOSIS — F331 Major depressive disorder, recurrent, moderate: Secondary | ICD-10-CM
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
## 2008-02-14 ENCOUNTER — Ambulatory Visit (HOSPITAL_BASED_OUTPATIENT_CLINIC_OR_DEPARTMENT_OTHER): Payer: MEDICARE | Admitting: Clinical

## 2008-02-14 DIAGNOSIS — F331 Major depressive disorder, recurrent, moderate: Principal | ICD-10-CM

## 2008-02-14 DIAGNOSIS — F449 Dissociative and conversion disorder, unspecified: Secondary | ICD-10-CM
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
## 2008-02-15 ENCOUNTER — Ambulatory Visit (HOSPITAL_BASED_OUTPATIENT_CLINIC_OR_DEPARTMENT_OTHER): Payer: MEDICARE | Admitting: Clinical

## 2008-02-15 DIAGNOSIS — F449 Dissociative and conversion disorder, unspecified: Secondary | ICD-10-CM

## 2008-02-15 DIAGNOSIS — F331 Major depressive disorder, recurrent, moderate: Principal | ICD-10-CM
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
## 2008-02-18 ENCOUNTER — Ambulatory Visit (HOSPITAL_BASED_OUTPATIENT_CLINIC_OR_DEPARTMENT_OTHER): Payer: MEDICARE | Admitting: General Psych-Thurs Team (PRV Practice 10)

## 2008-02-18 DIAGNOSIS — F449 Dissociative and conversion disorder, unspecified: Secondary | ICD-10-CM

## 2008-02-18 DIAGNOSIS — F331 Major depressive disorder, recurrent, moderate: Principal | ICD-10-CM

## 2008-02-18 NOTE — Progress Notes (Signed)
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     Risk Level per Scale:  Suicide: low  Violence: low  Addiction: low    Current risk level represents increase in risk: No.                                                   Brianna Pollen, PHD LICSW   02/18/08

## 2008-02-21 ENCOUNTER — Ambulatory Visit (HOSPITAL_BASED_OUTPATIENT_CLINIC_OR_DEPARTMENT_OTHER): Payer: MEDICARE | Admitting: Clinical

## 2008-02-21 DIAGNOSIS — F331 Major depressive disorder, recurrent, moderate: Principal | ICD-10-CM

## 2008-02-21 DIAGNOSIS — F449 Dissociative and conversion disorder, unspecified: Secondary | ICD-10-CM
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
## 2008-02-22 ENCOUNTER — Ambulatory Visit (HOSPITAL_BASED_OUTPATIENT_CLINIC_OR_DEPARTMENT_OTHER): Payer: MEDICARE | Admitting: Clinical

## 2008-02-22 DIAGNOSIS — F331 Major depressive disorder, recurrent, moderate: Secondary | ICD-10-CM

## 2008-02-22 DIAGNOSIS — F449 Dissociative and conversion disorder, unspecified: Principal | ICD-10-CM
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
## 2008-02-25 ENCOUNTER — Ambulatory Visit (HOSPITAL_BASED_OUTPATIENT_CLINIC_OR_DEPARTMENT_OTHER): Payer: MEDICARE | Admitting: Registered Nurse

## 2008-02-25 DIAGNOSIS — F449 Dissociative and conversion disorder, unspecified: Secondary | ICD-10-CM

## 2008-02-25 DIAGNOSIS — F331 Major depressive disorder, recurrent, moderate: Principal | ICD-10-CM

## 2008-02-28 ENCOUNTER — Ambulatory Visit (HOSPITAL_BASED_OUTPATIENT_CLINIC_OR_DEPARTMENT_OTHER): Payer: MEDICARE | Admitting: Clinical

## 2008-02-28 DIAGNOSIS — F331 Major depressive disorder, recurrent, moderate: Secondary | ICD-10-CM

## 2008-02-28 DIAGNOSIS — F449 Dissociative and conversion disorder, unspecified: Secondary | ICD-10-CM

## 2008-02-28 NOTE — Progress Notes (Signed)
OUTPATIENT PSYCHIATRY GROUP PROGRESS NOTE    Patient Name: Brianna [name]     Group Name: [facility]    Leaders: Edwyna Perfect, RNCS    Service Type: [phone] Group Psychotherapy     Length of Group: 90 minutes           Purpose of Group (choose all that apply):   Affect regulation         Group Process:  Group reviewed homework: Emotion Regulation: observing and describing emotions.  Discussed ways to describe emotions, particularly shame.  Did role play, practiced observing and describing emotions enacted.       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     Risk Level per Scale:  Suicide: low  Violence: low  Addiction: low    Current risk level represents increase in risk: No.                                                 Edwyna Perfect, RN,CS  02/25/08
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
## 2008-02-29 ENCOUNTER — Ambulatory Visit (HOSPITAL_BASED_OUTPATIENT_CLINIC_OR_DEPARTMENT_OTHER): Payer: MEDICARE | Admitting: Clinical

## 2008-02-29 DIAGNOSIS — F331 Major depressive disorder, recurrent, moderate: Secondary | ICD-10-CM

## 2008-02-29 DIAGNOSIS — F449 Dissociative and conversion disorder, unspecified: Secondary | ICD-10-CM
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
## 2008-03-01 ENCOUNTER — Ambulatory Visit (HOSPITAL_BASED_OUTPATIENT_CLINIC_OR_DEPARTMENT_OTHER): Payer: MEDICARE | Admitting: Internal Medicine

## 2008-03-01 VITALS — BP 100/60 | Wt 114.0 lb

## 2008-03-01 DIAGNOSIS — M25552 Pain in left hip: Secondary | ICD-10-CM

## 2008-03-01 NOTE — Progress Notes (Signed)
Brianna [name] is a 52 year old w  Yesterday noted [name] in L hip joint - sharp, positional  Uncomfortable to full wt-bear  Today, [name] less, mobility easier  No pains in muscles, in other joints, in knees  No weakness, numbness or tingling   No trauma/fall hx  Generally has been well    OBJECTIVE:   BP 100/60  Wt 114 lb (51.71 kg)  LMP Postmenopausal   Gait: slightly antalgic, favoring L side  Knees, feet, calves, abd, back: normal.   No inguinal/groin tenderness  Nl ROM R hip  L hip, full ROM except for internal rotation - limited last few degrees of rotation by [name]  Palpable tight / ?spasmed tendon over lateral hip joint L    ASSESSMENT/PLAN:   Hip tendinitis? Better today than yesterday - nsaids, heat or cold as prefers, Opts for watch-and-wait for now, will let me know if worse.  Ortho appointment 8/10 for prn not improving.

## 2008-03-03 ENCOUNTER — Ambulatory Visit (HOSPITAL_BASED_OUTPATIENT_CLINIC_OR_DEPARTMENT_OTHER): Payer: MEDICARE | Admitting: General Psych-Thurs Team (PRV Practice 10)

## 2008-03-03 DIAGNOSIS — F331 Major depressive disorder, recurrent, moderate: Principal | ICD-10-CM

## 2008-03-03 DIAGNOSIS — F449 Dissociative and conversion disorder, unspecified: Secondary | ICD-10-CM

## 2008-03-03 NOTE — Progress Notes (Signed)
OUTPATIENT PSYCHIATRY GROUP PROGRESS NOTE    Patient Name: Brianna [name] [facility] SESSION]    Group Name: [facility]    Leaders: Ander Slade, RN    Service Type: [phone] Group Psychotherapy     Length of Group: 90 minutes           Purpose of Group (choose all that apply):   Affect regulation         Group Process: Group went over ER Function of Emotions.     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     Risk Level per Scale:  Suicide: low  Violence: low  Addiction: low    Current risk level represents increase in risk: No.                                                   [name], PHD LICSW   03/03/08

## 2008-03-06 ENCOUNTER — Ambulatory Visit (HOSPITAL_BASED_OUTPATIENT_CLINIC_OR_DEPARTMENT_OTHER): Payer: MEDICARE | Admitting: Clinical

## 2008-03-06 DIAGNOSIS — F331 Major depressive disorder, recurrent, moderate: Principal | ICD-10-CM

## 2008-03-06 DIAGNOSIS — F449 Dissociative and conversion disorder, unspecified: Secondary | ICD-10-CM
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
## 2008-03-07 ENCOUNTER — Ambulatory Visit (HOSPITAL_BASED_OUTPATIENT_CLINIC_OR_DEPARTMENT_OTHER): Payer: MEDICARE | Admitting: Clinical

## 2008-03-07 DIAGNOSIS — F449 Dissociative and conversion disorder, unspecified: Secondary | ICD-10-CM

## 2008-03-07 DIAGNOSIS — F331 Major depressive disorder, recurrent, moderate: Secondary | ICD-10-CM
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
## 2008-03-10 ENCOUNTER — Ambulatory Visit (HOSPITAL_BASED_OUTPATIENT_CLINIC_OR_DEPARTMENT_OTHER): Payer: MEDICARE | Admitting: General Psych-Thurs Team (PRV Practice 10)

## 2008-03-10 DIAGNOSIS — F449 Dissociative and conversion disorder, unspecified: Secondary | ICD-10-CM

## 2008-03-10 DIAGNOSIS — F331 Major depressive disorder, recurrent, moderate: Principal | ICD-10-CM

## 2008-03-10 NOTE — Progress Notes (Signed)
OUTPATIENT PSYCHIATRY GROUP PROGRESS NOTE    Patient Name: Brianna [name] [facility] SESSION]    Group Name: Advanced DBT    Leaders: Ander Slade, RN    Service Type: [phone] Group Psychotherapy     Length of Group: 90 minutes           Purpose of Group (choose all that apply):   Affect regulation         Group Process: Group went over ER #6: reducing vulnerability to negative emotions.    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     Risk Level per Scale:  Suicide: low  Violence: low  Addiction: low    Current risk level represents increase in risk: No.                                                   [name], PHD LICSW   03/10/08

## 2008-03-14 ENCOUNTER — Ambulatory Visit (HOSPITAL_BASED_OUTPATIENT_CLINIC_OR_DEPARTMENT_OTHER): Payer: MEDICARE | Admitting: Clinical

## 2008-03-14 ENCOUNTER — Ambulatory Visit (HOSPITAL_BASED_OUTPATIENT_CLINIC_OR_DEPARTMENT_OTHER): Payer: Self-pay

## 2008-03-14 DIAGNOSIS — F331 Major depressive disorder, recurrent, moderate: Secondary | ICD-10-CM
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
## 2008-03-15 ENCOUNTER — Ambulatory Visit (HOSPITAL_BASED_OUTPATIENT_CLINIC_OR_DEPARTMENT_OTHER): Payer: MEDICARE | Admitting: Clinical

## 2008-03-15 DIAGNOSIS — F331 Major depressive disorder, recurrent, moderate: Secondary | ICD-10-CM
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
## 2008-03-16 ENCOUNTER — Ambulatory Visit (HOSPITAL_BASED_OUTPATIENT_CLINIC_OR_DEPARTMENT_OTHER): Payer: Self-pay | Admitting: Dentist

## 2008-03-16 ENCOUNTER — Ambulatory Visit (HOSPITAL_BASED_OUTPATIENT_CLINIC_OR_DEPARTMENT_OTHER): Payer: MEDICARE | Admitting: Nurse Practitioner

## 2008-03-16 VITALS — BP 94/60 | Temp 99.1°F | Wt 111.0 lb

## 2008-03-16 DIAGNOSIS — K029 Dental caries, unspecified: Secondary | ICD-10-CM

## 2008-03-16 DIAGNOSIS — T148XXA Other injury of unspecified body region, initial encounter: Secondary | ICD-10-CM

## 2008-03-16 DIAGNOSIS — Z0120 Encounter for dental examination and cleaning without abnormal findings: Principal | ICD-10-CM

## 2008-03-16 NOTE — Progress Notes (Signed)
SUBJECTIVE:   Brianna [name] is a 52 year old female s/p fall 2 days ago , apparently patient was running and slid and hit the right knee and the left elbow, patient reports she is able to get up by herself, no severe pain.    She denies previous episode.     OBJECTIVE:  She appears well, vital signs are as noted by the nurse. Ears normal.  Throat and pharynx normal.  Neck supple. No adenopathy in the neck. Nose is patent Sinuses non tender. The chest is clear.   Ext. Right knee + abrasion approx size 40x24mm. Right elbow triangle like approx size length 30mm surounded erythema. No drainage no signs and symptoms of infection.     ASSESSMENT:   919.0T Skin Abrasion  (primary encounter diagnosis)  Plan: Clean area with normal saline and pad dry. Apply bacitracin 2 times daily. Keep area LOTA  Monitor for signs and symptoms of infection redness, any abnormal/purulent drainage.   Call or return to clinic prn if these symptoms worsen or fail to improve as anticipated.

## 2008-03-17 ENCOUNTER — Ambulatory Visit (HOSPITAL_BASED_OUTPATIENT_CLINIC_OR_DEPARTMENT_OTHER): Payer: MEDICARE | Admitting: Registered Nurse

## 2008-03-20 ENCOUNTER — Ambulatory Visit (HOSPITAL_BASED_OUTPATIENT_CLINIC_OR_DEPARTMENT_OTHER): Payer: MEDICARE | Admitting: Orthopaedic Surgery

## 2008-03-20 ENCOUNTER — Ambulatory Visit (HOSPITAL_BASED_OUTPATIENT_CLINIC_OR_DEPARTMENT_OTHER): Payer: MEDICARE | Admitting: Clinical

## 2008-03-20 DIAGNOSIS — F331 Major depressive disorder, recurrent, moderate: Secondary | ICD-10-CM
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
## 2008-03-21 ENCOUNTER — Ambulatory Visit (HOSPITAL_BASED_OUTPATIENT_CLINIC_OR_DEPARTMENT_OTHER): Payer: MEDICARE | Admitting: Clinical

## 2008-03-21 DIAGNOSIS — F331 Major depressive disorder, recurrent, moderate: Secondary | ICD-10-CM
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
## 2008-03-24 ENCOUNTER — Ambulatory Visit (HOSPITAL_BASED_OUTPATIENT_CLINIC_OR_DEPARTMENT_OTHER): Payer: MEDICARE | Admitting: General Psych-Thurs Team (PRV Practice 10)

## 2008-03-24 DIAGNOSIS — F331 Major depressive disorder, recurrent, moderate: Principal | ICD-10-CM

## 2008-03-24 NOTE — Progress Notes (Signed)
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     Risk Level per Scale:  Suicide: low  Violence: low  Addiction: low    Current risk level represents increase in risk: No.                                                   Brianna Pollen, PHD LICSW  03/24/08

## 2008-03-27 ENCOUNTER — Ambulatory Visit (HOSPITAL_BASED_OUTPATIENT_CLINIC_OR_DEPARTMENT_OTHER): Payer: MEDICARE | Admitting: Clinical

## 2008-03-27 DIAGNOSIS — F331 Major depressive disorder, recurrent, moderate: Principal | ICD-10-CM
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
## 2008-03-28 ENCOUNTER — Ambulatory Visit (HOSPITAL_BASED_OUTPATIENT_CLINIC_OR_DEPARTMENT_OTHER): Payer: MEDICARE | Admitting: Clinical

## 2008-03-28 DIAGNOSIS — F331 Major depressive disorder, recurrent, moderate: Principal | ICD-10-CM
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
## 2008-03-31 ENCOUNTER — Ambulatory Visit (HOSPITAL_BASED_OUTPATIENT_CLINIC_OR_DEPARTMENT_OTHER): Payer: MEDICARE | Admitting: Internal Medicine

## 2008-03-31 ENCOUNTER — Ambulatory Visit (HOSPITAL_BASED_OUTPATIENT_CLINIC_OR_DEPARTMENT_OTHER): Payer: MEDICARE | Admitting: General Psych-Thurs Team (PRV Practice 10)

## 2008-03-31 DIAGNOSIS — F331 Major depressive disorder, recurrent, moderate: Secondary | ICD-10-CM

## 2008-03-31 DIAGNOSIS — M25551 Pain in right hip: Secondary | ICD-10-CM

## 2008-03-31 DIAGNOSIS — M25552 Pain in left hip: Principal | ICD-10-CM

## 2008-03-31 NOTE — Progress Notes (Signed)
OUTPATIENT PSYCHIATRY GROUP PROGRESS NOTE    Patient Name: Brianna [name] [facility] SESSION]    Group Name: Advanced DBT    Leaders: Ander Slade, RN    Service Type: [phone] Group Psychotherapy     Length of Group: 90 minutes           Purpose of Group (choose all that apply):   Affect regulation         Group Process: Group went over ER #10: Opposite Action.    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     Risk Level per Scale:  Suicide: low  Violence: low  Addiction: low    Current risk level represents increase in risk: No.                                                   [name], PHD LICSW 03/31/08

## 2008-03-31 NOTE — Progress Notes (Signed)
Brianna [name] is a 52 year old w  Prev hip pain - 7/22, L hip - got better  But now - both hips - but more in muscle?  Went on couple weeks  Came and went  Worse when walking  Worse if long sitting, and then stands up  R > L  Feels different from what she had 7/22   LEs - no numbness/tingling or weakness  Had started jogging in am's   Was feeling it during her starting of joggings    OBJECTIVE:   well-appearing, Looks generally at baseline - pleasant woman;  No difficulties sit-to-stand, getting on or off exam table, or stand-to-sit.   Gait is normal, anantalgic.  Hip exam - both sides normal; full range of motion, no pain on motion, no effusion, masses, contracture or deformity noted. Only positive finding - some tenderness to palpation bilaterally over supratrochanteric area.    ASSESSMENT/PLAN:   Hip joint tendinitis? Bursitis?  We both agree that it'd be hekpful to havee Ortho take a look - sched'd.  In the meantime, she knows to let me know if any changes in her symptoms or if any new symptoms arise.

## 2008-04-03 ENCOUNTER — Ambulatory Visit (HOSPITAL_BASED_OUTPATIENT_CLINIC_OR_DEPARTMENT_OTHER): Payer: MEDICARE | Admitting: Clinical

## 2008-04-03 DIAGNOSIS — F331 Major depressive disorder, recurrent, moderate: Secondary | ICD-10-CM
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
## 2008-04-04 ENCOUNTER — Ambulatory Visit (HOSPITAL_BASED_OUTPATIENT_CLINIC_OR_DEPARTMENT_OTHER): Payer: MEDICARE | Admitting: Clinical

## 2008-04-04 DIAGNOSIS — F331 Major depressive disorder, recurrent, moderate: Principal | ICD-10-CM
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
## 2008-04-07 ENCOUNTER — Ambulatory Visit (HOSPITAL_BASED_OUTPATIENT_CLINIC_OR_DEPARTMENT_OTHER): Payer: MEDICARE | Admitting: General Psych-Thurs Team (PRV Practice 10)

## 2008-04-07 ENCOUNTER — Ambulatory Visit (HOSPITAL_BASED_OUTPATIENT_CLINIC_OR_DEPARTMENT_OTHER): Payer: Self-pay

## 2008-04-07 ENCOUNTER — Encounter (HOSPITAL_BASED_OUTPATIENT_CLINIC_OR_DEPARTMENT_OTHER): Payer: Self-pay | Admitting: Clinical

## 2008-04-07 DIAGNOSIS — F331 Major depressive disorder, recurrent, moderate: Principal | ICD-10-CM

## 2008-04-07 DIAGNOSIS — K036 Deposits [accretions] on teeth: Secondary | ICD-10-CM

## 2008-04-07 NOTE — Progress Notes (Signed)
OUTPATIENT PSYCHIATRY GROUP PROGRESS NOTE    Patient Name: Brianna [name] [facility] SESSION]    Group Name: Advanced DBT    Leaders: Ander Slade, RN    Service Type: [phone] Group Psychotherapy     Length of Group: 90 minutes           Purpose of Group (choose all that apply):   Affect regulation         Group Process: Group went over IE #1 & 2.     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     Risk Level per Scale:  Suicide: low  Violence: low  Addiction: low    Current risk level represents increase in risk: No.                                                   [name], PHD LICSW 04/07/08

## 2008-04-07 NOTE — Progress Notes (Unsigned)
PSYCHIATRY TERMINATION AND TRANSFER NOTE    {TERMINATION/TRANSFER:11116}    Date treatment started: ***    Transfer/termination date: ***    Reason for treatment: ***    Treatment course (response to medications, compliance): ***    Outstanding Issues: ***    Safe to refill: ***    Risk level: ***    Plan: ***    For transfers, the treatment plan will now be the responsibility of: {NOT APPLICABLE:10871}    (Clinician, please route/cc this encounter to your team/program administrative coordinator so that the treatment plan database can be updated.).

## 2008-04-14 ENCOUNTER — Ambulatory Visit (HOSPITAL_BASED_OUTPATIENT_CLINIC_OR_DEPARTMENT_OTHER): Payer: MEDICARE | Admitting: General Psych-Thurs Team (PRV Practice 10)

## 2008-04-21 ENCOUNTER — Ambulatory Visit (HOSPITAL_BASED_OUTPATIENT_CLINIC_OR_DEPARTMENT_OTHER): Payer: MEDICARE | Admitting: General Psych-Thurs Team (PRV Practice 10)

## 2008-04-24 ENCOUNTER — Ambulatory Visit (HOSPITAL_BASED_OUTPATIENT_CLINIC_OR_DEPARTMENT_OTHER): Payer: MEDICARE | Admitting: Surgical

## 2008-04-24 LAB — XR HIPS BILATERAL WITH AP PELVIS

## 2008-04-28 ENCOUNTER — Ambulatory Visit (HOSPITAL_BASED_OUTPATIENT_CLINIC_OR_DEPARTMENT_OTHER): Payer: MEDICARE | Admitting: General Psych-Thurs Team (PRV Practice 10)

## 2008-05-03 LAB — ORTHOPEDIC OFFICE NOTE

## 2008-05-05 ENCOUNTER — Ambulatory Visit (HOSPITAL_BASED_OUTPATIENT_CLINIC_OR_DEPARTMENT_OTHER): Payer: MEDICARE | Admitting: General Psych-Thurs Team (PRV Practice 10)

## 2008-05-11 ENCOUNTER — Encounter (HOSPITAL_BASED_OUTPATIENT_CLINIC_OR_DEPARTMENT_OTHER): Payer: Self-pay | Admitting: General Psych-Thurs Team (PRV Practice 10)

## 2008-05-11 ENCOUNTER — Ambulatory Visit (HOSPITAL_BASED_OUTPATIENT_CLINIC_OR_DEPARTMENT_OTHER): Payer: MEDICARE | Admitting: General Psych-Thurs Team (PRV Practice 10)

## 2008-05-11 DIAGNOSIS — F449 Dissociative and conversion disorder, unspecified: Secondary | ICD-10-CM

## 2008-05-11 DIAGNOSIS — F331 Major depressive disorder, recurrent, moderate: Secondary | ICD-10-CM

## 2008-05-11 DIAGNOSIS — F4310 Post-traumatic stress disorder, unspecified: Principal | ICD-10-CM

## 2008-05-11 NOTE — Progress Notes (Signed)
PSYCHIATRY OUTPATIENT PROGRESS NOTE    VISIT TYPE: Individual session to complete 6 months of DBT skills training        INTERPRETER: No interpreter needed.    PROBLEMS which this visit addressed:     Problem 1: Dissociation    Problem 2: Poor self esteem       Problem 3: transfer of tx       Problem 4: Social Isolation          SOURCE(S) OF INFORMATION:  Patient     SUBJECTIVE FINDINGS: I am not impulsive, in that way I felt different from other people in the group.  But it was helpful for me to learn to identify feelings and understand their function. I am so used to dissociating and not being in my body that feeling is still very hard for me as well as being what I want.                                                  OBJECTIVE FINDINGS:   Pertinent positive and negative parts of mental status exam: This was the last session with pt     Signs and symptoms: Mood: "sad"  Affect: sad  Thought/Speech: WNL        Current medications (n/a for psychotherapy only visits):  N/A         Medications taken as prescribed (n/a for psychotherapy only visits): N/A    Medication side effects - including movement disorders/AIMS score  (n/a for psychotherapy only visits):  N/A      Testing results:  No test results pending.        Risk behaviors: None reported.      ASSESSMENT:  Clinical formulation:   Brianna [name] is a 52 year-old single white female who sought DBT for dissociation, emotional avoidance and disorganization that interfere with work and social relationships.        Clinical interventions today and patient's response: Discussed what was and wasn't useful for pt about learning DBT skills, and how to work with her habit of emotional avoidance and dissociation from physical experiencing as she continues in treatment including mindful observation, opposite action when appropriate, direct access of parts by her therapist, self validation and witnessing.      Dual diagnosis stage of change: No dual  diagnosis    Medical necessity for today's visit: Pt will continue with her individual therapist.    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    Risk plan (for patients at moderate/high risk for suicide/violence/addiction): Patient not at moderate or high risk.    Next visit: none           Amount of time spent w/patient today: 55 minutes      [name], PhD, LICSW

## 2008-05-11 NOTE — Progress Notes (Signed)
PSYCHIATRY TERMINATION AND TRANSFER NOTE    TERMINATION    Date treatment started: 11/05/07    Transfer/termination date: 05/11/08    Reason for treatment: inhibited emotional experiencing    Treatment course (response to medications, compliance): pt attended DBT skills group for 6 months    Outstanding Issues: pt will continue in individual therapy and will practice opposite action in relation to inhibited emotions like anger and self assertion    Risk level: low    Plan: pt will continue privately with her individual therapist.    (Clinician, please route/cc this encounter to your team/program administrative coordinator so that the treatment plan database can be updated.)

## 2008-05-12 ENCOUNTER — Ambulatory Visit (HOSPITAL_BASED_OUTPATIENT_CLINIC_OR_DEPARTMENT_OTHER): Payer: MEDICARE | Admitting: General Psych-Thurs Team (PRV Practice 10)

## 2008-05-17 ENCOUNTER — Ambulatory Visit (HOSPITAL_BASED_OUTPATIENT_CLINIC_OR_DEPARTMENT_OTHER): Payer: Self-pay | Admitting: Orthopaedic Surgery

## 2008-05-19 ENCOUNTER — Ambulatory Visit (HOSPITAL_BASED_OUTPATIENT_CLINIC_OR_DEPARTMENT_OTHER): Payer: MEDICARE | Admitting: General Psych-Thurs Team (PRV Practice 10)

## 2008-05-26 ENCOUNTER — Ambulatory Visit (HOSPITAL_BASED_OUTPATIENT_CLINIC_OR_DEPARTMENT_OTHER): Payer: MEDICARE | Admitting: General Psych-Thurs Team (PRV Practice 10)

## 2008-06-02 ENCOUNTER — Ambulatory Visit (HOSPITAL_BASED_OUTPATIENT_CLINIC_OR_DEPARTMENT_OTHER): Payer: MEDICARE | Admitting: General Psych-Thurs Team (PRV Practice 10)

## 2008-06-09 ENCOUNTER — Ambulatory Visit (HOSPITAL_BASED_OUTPATIENT_CLINIC_OR_DEPARTMENT_OTHER): Payer: MEDICARE | Admitting: General Psych-Thurs Team (PRV Practice 10)

## 2008-06-16 ENCOUNTER — Ambulatory Visit (HOSPITAL_BASED_OUTPATIENT_CLINIC_OR_DEPARTMENT_OTHER): Payer: MEDICARE | Admitting: General Psych-Thurs Team (PRV Practice 10)

## 2008-06-23 ENCOUNTER — Ambulatory Visit (HOSPITAL_BASED_OUTPATIENT_CLINIC_OR_DEPARTMENT_OTHER): Payer: MEDICARE | Admitting: General Psych-Thurs Team (PRV Practice 10)

## 2008-06-30 ENCOUNTER — Ambulatory Visit (HOSPITAL_BASED_OUTPATIENT_CLINIC_OR_DEPARTMENT_OTHER): Payer: MEDICARE | Admitting: General Psych-Thurs Team (PRV Practice 10)

## 2008-07-07 ENCOUNTER — Ambulatory Visit (HOSPITAL_BASED_OUTPATIENT_CLINIC_OR_DEPARTMENT_OTHER): Payer: MEDICARE | Admitting: General Psych-Thurs Team (PRV Practice 10)

## 2008-07-14 ENCOUNTER — Ambulatory Visit (HOSPITAL_BASED_OUTPATIENT_CLINIC_OR_DEPARTMENT_OTHER): Payer: MEDICARE | Admitting: General Psych-Thurs Team (PRV Practice 10)

## 2008-07-21 ENCOUNTER — Ambulatory Visit (HOSPITAL_BASED_OUTPATIENT_CLINIC_OR_DEPARTMENT_OTHER): Payer: MEDICARE | Admitting: General Psych-Thurs Team (PRV Practice 10)

## 2008-07-28 ENCOUNTER — Ambulatory Visit (HOSPITAL_BASED_OUTPATIENT_CLINIC_OR_DEPARTMENT_OTHER): Payer: MEDICARE | Admitting: General Psych-Thurs Team (PRV Practice 10)

## 2008-09-22 ENCOUNTER — Encounter (HOSPITAL_BASED_OUTPATIENT_CLINIC_OR_DEPARTMENT_OTHER): Payer: Self-pay | Admitting: Psychologist

## 2008-10-11 ENCOUNTER — Ambulatory Visit (HOSPITAL_BASED_OUTPATIENT_CLINIC_OR_DEPARTMENT_OTHER): Payer: MEDICARE | Admitting: Internal Medicine

## 2008-10-11 VITALS — BP 114/82 | Temp 98.2°F | Wt 114.0 lb

## 2008-10-11 DIAGNOSIS — R21 Rash and other nonspecific skin eruption: Principal | ICD-10-CM

## 2008-10-11 DIAGNOSIS — E559 Vitamin D deficiency, unspecified: Secondary | ICD-10-CM

## 2008-10-11 NOTE — Progress Notes (Signed)
Brianna [name] is a 53 year old w  Here re: rash on legs.  10 days ago anterior surface R thigh and some over anterior R hip, red rash, nonpruritic, not raised or bumpy, no central clearing, describes the redness was "almost like in little [horizontal] lines", no blisters or bullae, nontender, can identify no contacts or precipitants.   - now erythema resolved and only slight brownish discoloration and scale, "just crusty"  Systemic: no fever, chills, sweats or myalgias - feels entirely well.  No new meds - no meds at all.    OBJECTIVE:   BP 114/82  Temp (Src) 98.2 F (36.8 C) (Temporal)  Wt 114 lb (51.71 kg)  LMP Postmenopausal   well-appearing - Alert, ambulatory, attentive.   General quick skin rvw reveals - normal skin, no rash;  Anterior R thigh, vaguely-defined faint brownish discoloration 8 x 8 cm approx., few areas with slight scale to touch (not visible; slight scale to touch, not visible, area over R trochanter.  No inguinal lymphadenopathy.    ASSESSMENT/PLAN:   782.1R Rash  (primary encounter diagnosis) - ?contact derm to unknown irritant - nearly back to normal at this point - she will let me know if recurs or if any other rash or any other symptoms, otherwise RTC whe due for annual exam.  268.9G Vitamin D Deficiency  S/p tx: labs.

## 2008-10-24 ENCOUNTER — Ambulatory Visit: Payer: MEDICARE | Admitting: Optometry

## 2008-10-24 ENCOUNTER — Ambulatory Visit (HOSPITAL_BASED_OUTPATIENT_CLINIC_OR_DEPARTMENT_OTHER): Payer: PRIVATE HEALTH INSURANCE

## 2008-10-24 ENCOUNTER — Ambulatory Visit: Payer: PRIVATE HEALTH INSURANCE | Admitting: Optometry

## 2008-10-24 ENCOUNTER — Encounter: Payer: Self-pay | Admitting: Optometry

## 2008-10-24 DIAGNOSIS — H52209 Unspecified astigmatism, unspecified eye: Secondary | ICD-10-CM

## 2008-10-24 DIAGNOSIS — H5210 Myopia, unspecified eye: Secondary | ICD-10-CM

## 2008-10-24 NOTE — Progress Notes (Signed)
I had the pleasure of seeing 53 year old Brianna [name] at the [facility] on 10/24/2008.   The summary of findings of the comprehensive eye exam are following:    Assessment:   Myopia  Astigmatism  Presbyopia      Plan:    1. Patient does have to wear glasses.  Glasses should be worn full time.    2. Return to clinic in one year for full exam.

## 2008-10-24 NOTE — Progress Notes (Signed)
Final rx for DVO is  AO -4.75 OD and OS: -6.50 8.4/14/0    RTC 1 yr

## 2008-11-13 ENCOUNTER — Telehealth (HOSPITAL_BASED_OUTPATIENT_CLINIC_OR_DEPARTMENT_OTHER): Payer: Self-pay

## 2008-11-13 NOTE — Telephone Encounter (Signed)
Staff Message copied by Marnette Burgess on Mon Nov 13, 2008 10:16 AM  ------   Message from: Rennie Plowman   Created: Mon Nov 13, 2008 9:31 AM   Regarding: Questions and concerns   Contact: [phone]    Pt has an appt 4/7 with Dr Creed Copper, but pt states she might not need that appt if she talks to Dr Creed Copper or RN first.
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
## 2008-11-15 ENCOUNTER — Ambulatory Visit (HOSPITAL_BASED_OUTPATIENT_CLINIC_OR_DEPARTMENT_OTHER): Payer: MEDICARE | Admitting: Internal Medicine

## 2008-11-15 VITALS — BP 100/78 | Temp 99.1°F | Ht 63.0 in | Wt 113.0 lb

## 2008-11-15 DIAGNOSIS — Z0000 Encounter for general adult medical examination without abnormal findings: Principal | ICD-10-CM

## 2008-11-15 DIAGNOSIS — E559 Vitamin D deficiency, unspecified: Secondary | ICD-10-CM

## 2008-11-15 DIAGNOSIS — R413 Other amnesia: Secondary | ICD-10-CM

## 2008-11-15 NOTE — Telephone Encounter (Signed)
Seen
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
## 2008-11-17 LAB — VITAMIN D,25 HYDROXY: VITAMIN D,25 HYDROXY: 20.7 ng/ml — ABNORMAL LOW (ref 30.0–100.0)

## 2008-11-29 ENCOUNTER — Other Ambulatory Visit (HOSPITAL_BASED_OUTPATIENT_CLINIC_OR_DEPARTMENT_OTHER): Payer: Self-pay | Admitting: Psychological Testing (PRV Practice 10)

## 2008-11-29 NOTE — Telephone Encounter (Signed)
NAPA referral received 11/29/2008 and assigned for testing to [name].

## 2008-11-30 NOTE — Telephone Encounter (Signed)
Active Medicare A and B. Jg 4/22

## 2008-12-03 MED ORDER — VITAMIN D (ERGOCALCIFEROL) 1.25 MG (50000 UT) PO CAPS
ORAL_CAPSULE | ORAL | Status: AC
Start: 2008-12-03 — End: 2009-12-03

## 2008-12-03 NOTE — Progress Notes (Addendum)
Addended by: Loann Quill on: 12/03/2008 5:13:00 PM     Modules accepted: Orders

## 2009-02-01 ENCOUNTER — Telehealth: Payer: Self-pay | Admitting: Optometry

## 2009-02-15 ENCOUNTER — Ambulatory Visit (HOSPITAL_BASED_OUTPATIENT_CLINIC_OR_DEPARTMENT_OTHER): Payer: MEDICARE | Admitting: Psychological Testing (PRV Practice 10)

## 2009-02-15 ENCOUNTER — Ambulatory Visit (HOSPITAL_BASED_OUTPATIENT_CLINIC_OR_DEPARTMENT_OTHER): Payer: MEDICARE | Admitting: Clinical Neuropsychologist

## 2009-02-15 DIAGNOSIS — F331 Major depressive disorder, recurrent, moderate: Secondary | ICD-10-CM

## 2009-02-15 NOTE — Progress Notes (Signed)
..  PSYCHOLOGY/NEUROPSYCHOLOGY TESTING PROGRESS NOTE      VISIT TYPE: Neuropsychological testing    INTERPRETER: No interpreter needed.    REASON FOR TESTING REFERRAL: cognitive problem    SOURCE(S) OF INFORMATION: Patient    SUBJECTIVE FINDINGS: "I have memory problems"    OBJECTIVE FINDINGS (pertinent positive and negative parts of mental status exam): pt participated fully in testing and was alert.  See report.    ASSESSMENT:  Clinical Formulation/Testing Results: see report    Clinical interventions today and patient's response: testing conducted, pt will return for completion of testing.  See report.    Medical necessity for today's visit: cognitive problems    DIAGNOSES:  Axis I (primary): r/o congitive disorder, nos  Axis II: deferred  Axis III: see medical record  Axis IV: stress related to dissociative episodes  Axis V: 60    PLAN: pt will return for completion of testing    Recommendations: see report    Amount of time spent with patient today: 2 hours

## 2009-02-16 ENCOUNTER — Ambulatory Visit: Payer: PRIVATE HEALTH INSURANCE | Admitting: Optometry

## 2009-02-16 ENCOUNTER — Ambulatory Visit (HOSPITAL_BASED_OUTPATIENT_CLINIC_OR_DEPARTMENT_OTHER): Payer: MEDICARE | Admitting: Clinical Neuropsychologist

## 2009-02-16 ENCOUNTER — Ambulatory Visit (HOSPITAL_BASED_OUTPATIENT_CLINIC_OR_DEPARTMENT_OTHER): Payer: MEDICARE

## 2009-02-16 ENCOUNTER — Encounter: Payer: Self-pay | Admitting: Optometry

## 2009-02-16 DIAGNOSIS — H5210 Myopia, unspecified eye: Secondary | ICD-10-CM

## 2009-02-16 DIAGNOSIS — H524 Presbyopia: Secondary | ICD-10-CM

## 2009-02-16 DIAGNOSIS — F331 Major depressive disorder, recurrent, moderate: Principal | ICD-10-CM

## 2009-02-16 NOTE — Progress Notes (Signed)
..  PSYCHOLOGY/NEUROPSYCHOLOGY TESTING PROGRESS NOTE      VISIT TYPE: Neuropsychological testing    INTERPRETER: No interpreter needed.    REASON FOR TESTING REFERRAL: cognitive problem    SOURCE(S) OF INFORMATION: Patient    SUBJECTIVE FINDINGS: "I have memory problems"    OBJECTIVE FINDINGS (pertinent positive and negative parts of mental status exam): pt participated fully in testing and was alert.  See report.    ASSESSMENT:  Clinical Formulation/Testing Results: see report    Clinical interventions today and patient's response: testing conducted, pt will return for completion of testing.  See report.    Medical necessity for today's visit: cognitive problems    DIAGNOSES:  Axis I (primary): r/o congitive disorder, nos  Axis II: deferred  Axis III: see medical record  Axis IV: stress related to dissociative episodes  Axis V: 60    PLAN: pt will return for completion of testing    Recommendations: see report    Amount of time spent with patient today: 2 hours

## 2009-02-16 NOTE — Nursing Note (Signed)
>>   Brianna [name]     Fri Feb 16, 2009  2:55 PM  53 yo female pt here today for follow up on CL monovision; pt interested in wearing monovision lenses throughout the day but temporarily switching to distance lenses for certain activities (such as driving) and how that would relate to any brain involvement; pt has been wearing the monovision lenses since about 8:00 AM; no eye pain, dry eye; pt reports some itchiness OD noticed all day, does not report seasonal allergies, no red eye, pt takes lens out and rinses it and reinserts; pt uses OptiFree MPS; pt reports taking naps with lenses on but not for extended sleep

## 2009-02-19 ENCOUNTER — Ambulatory Visit (HOSPITAL_BASED_OUTPATIENT_CLINIC_OR_DEPARTMENT_OTHER): Payer: MEDICARE | Admitting: Clinical Neuropsychologist

## 2009-02-19 ENCOUNTER — Ambulatory Visit (HOSPITAL_BASED_OUTPATIENT_CLINIC_OR_DEPARTMENT_OTHER): Payer: MEDICARE | Admitting: Psychologist

## 2009-02-19 DIAGNOSIS — F331 Major depressive disorder, recurrent, moderate: Principal | ICD-10-CM
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
## 2009-02-19 NOTE — Progress Notes (Signed)
...  PSYCHOLOGY/NEUROPSYCHOLOGY TESTING PROGRESS NOTE      VISIT TYPE: Neuropsychological testing    INTERPRETER: No interpreter needed.    REASON FOR TESTING REFERRAL: cognitive problem    SOURCE(S) OF INFORMATION: Patient    SUBJECTIVE FINDINGS: "I have memory problems"    OBJECTIVE FINDINGS (pertinent positive and negative parts of mental status exam): pt participated fully in testing and was alert.  See report following pt's feedback session.    ASSESSMENT:  Clinical Formulation/Testing Results: see report following pt's feedback session    Clinical interventions today and patient's response: testing conducted, pt will return for testing feedback in one month.  See report following pt's feedback session.    Medical necessity for today's visit: cognitive problems    DIAGNOSES:  Axis I (primary): r/o congitive disorder, nos  Axis II: deferred  Axis III: see medical record  Axis IV: stress related to dissociative episodes  Axis V: 60    PLAN: pt will return for testing feedback in one month    Recommendations: see report    Amount of time spent with patient today: 1 hour

## 2009-03-16 ENCOUNTER — Ambulatory Visit (HOSPITAL_BASED_OUTPATIENT_CLINIC_OR_DEPARTMENT_OTHER): Payer: Self-pay | Admitting: Psychologist

## 2009-03-16 ENCOUNTER — Ambulatory Visit (HOSPITAL_BASED_OUTPATIENT_CLINIC_OR_DEPARTMENT_OTHER): Payer: MEDICARE | Admitting: Psychologist

## 2009-03-16 DIAGNOSIS — F449 Dissociative and conversion disorder, unspecified: Secondary | ICD-10-CM
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
## 2009-03-22 ENCOUNTER — Telehealth (HOSPITAL_BASED_OUTPATIENT_CLINIC_OR_DEPARTMENT_OTHER): Payer: Self-pay | Admitting: Psychologist

## 2009-03-22 ENCOUNTER — Ambulatory Visit (HOSPITAL_BASED_OUTPATIENT_CLINIC_OR_DEPARTMENT_OTHER): Payer: MEDICARE

## 2009-03-22 DIAGNOSIS — F329 Major depressive disorder, single episode, unspecified: Principal | ICD-10-CM

## 2009-03-22 DIAGNOSIS — F449 Dissociative and conversion disorder, unspecified: Secondary | ICD-10-CM

## 2009-03-22 NOTE — Progress Notes (Signed)
This encounter was opened in error.  Please disregard.

## 2009-03-22 NOTE — Telephone Encounter (Signed)
I called pt yesterday and left her a voicemail, confirming that she could not be seen in [facility], as I had prepared her for in our consultation. I confirmed the plan for pt to see if she could continue therapy with her current therapist in private practice and, if not, she will call the [facility].

## 2009-03-22 NOTE — Progress Notes (Signed)
PSYCHOLOGY/NEUROPSYCHOLOGY TESTING PROGRESS NOTE      VISIT TYPE: Neuropsychological testing feedback    INTERPRETER: No interpreter needed.    REASON FOR TESTING REFERRAL: cognitive problem    SOURCE(S) OF INFORMATION: Patient    SUBJECTIVE FINDINGS: "I have memory problems"    OBJECTIVE FINDINGS (pertinent positive and negative parts of mental status exam): pt participated fully in testing feedback and was alert.  See report.    ASSESSMENT:  Clinical Formulation/Testing Results: see report    Clinical interventions today and patient's response: testing feedback conducted.  See report    Medical necessity for today's visit: cognitive problems    DIAGNOSES:  Axis I (primary): MDD, r/o PTSD  Axis II: deferred  Axis III: see medical record  Axis IV: stress related to dissociative episodes  Axis V: 60    PLAN: see report  Recommendations: see report    Amount of time spent with patient today: 1.5 hours

## 2009-03-28 ENCOUNTER — Telehealth (HOSPITAL_BASED_OUTPATIENT_CLINIC_OR_DEPARTMENT_OTHER): Payer: Self-pay | Admitting: Psychologist

## 2009-03-28 NOTE — Telephone Encounter (Signed)
Pt called me to thank me for getting back to her about VOV and informing me that the BIP said they were short on therapists. In the meantime, pt obtained some names of private clinicians who accept Medicare and asked me my opinion re: match with her. I suggested two of the names she had might be a good match. I also informed her about [facility] which has openings currently and accepts Medicare/[facility] which is what pt reports she has. Pt inquired about mental health availability at the [facility] where she receives her primary care and I suggested she give them a call as well. I reminded her that the [facility] is not an appropriate referral for her since she is looking for longoing, regular, long-term weekly or twice weekly therapy. She already knows we cannot accommodate her in the [facility] because we do not have non-trainee licensed clinicians, as Medicare requires.

## 2009-04-09 ENCOUNTER — Encounter (HOSPITAL_BASED_OUTPATIENT_CLINIC_OR_DEPARTMENT_OTHER): Payer: Self-pay
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
## 2009-05-07 ENCOUNTER — Ambulatory Visit (HOSPITAL_BASED_OUTPATIENT_CLINIC_OR_DEPARTMENT_OTHER): Payer: MEDICARE | Admitting: Psychologist

## 2009-05-07 DIAGNOSIS — F449 Dissociative and conversion disorder, unspecified: Secondary | ICD-10-CM
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
## 2009-06-14 ENCOUNTER — Encounter (HOSPITAL_BASED_OUTPATIENT_CLINIC_OR_DEPARTMENT_OTHER): Payer: Self-pay | Admitting: Psychologist

## 2009-06-14 NOTE — Progress Notes (Addendum)
.    PSYCHIATRY TERMINATION AND TRANSFER NOTE    Termination Document    Date treatment started: Pt was seen in consultation by me 3x since July, 2010, and has called me with follow-up info in between consultation meetings.    Transfer/termination date: 05/10/09    Reason for treatment: Pt is seeking a new therapist.    Treatment course (response to medications, compliance): Pt has called me on occasion over the years when she is looking for a new therapist and I have helped facilitate this process for her.    Outstanding Issues: mild dissociative episodes, unemployment, depression, social isolation     Safe to refill: n/a    Risk level:  Low, low, low    Plan: Pt is looking for ongoing weekly therapy unlimited in duration. Pt has Medicare so cannot see a trainee in our system and there are no staff currently available to accommodate what pt is looking for. Therefore, I have given pt several referrals (see notes for details). At our last contact, pt was pursuing [facility] at the [facility] which accepts [facility].      For transfers, the treatment plan will now be the responsibility of: n/a    (Clinician, please route/cc this encounter to your team/program administrative coordinator so that the treatment plan database can be updated.)

## 2009-06-20 ENCOUNTER — Telehealth (HOSPITAL_BASED_OUTPATIENT_CLINIC_OR_DEPARTMENT_OTHER): Payer: Self-pay

## 2009-06-20 NOTE — Telephone Encounter (Signed)
Telephone call to pt, busy signal.

## 2009-06-20 NOTE — Telephone Encounter (Signed)
Unable to reach patient,  left voicemail requesting call back.

## 2009-06-20 NOTE — Telephone Encounter (Signed)
Message copied by Earleen Reaper on Wed Jun 20, 2009 10:02 AM  ------       Message from: Don Broach       Created: Wed Jun 20, 2009  9:59 AM       Regarding: Referral question       Contact: [phone]         Pt would like to speak to Dr. Creed Copper in regards of obtaining a referral to utilize the [facility].

## 2009-06-22 NOTE — Telephone Encounter (Signed)
Allene Dillon   [facility] Friday June 22, 2009 10:43 AM   To [place]   Phone [phone]   Subject missed call    Patient Brianna [name] [[id number]] (DOB: 1956/04/13)   Phone Entered Pt Work Pt Home   [phone]             Message Pt is returning nurse call [phone] or [phone]

## 2009-06-22 NOTE — Telephone Encounter (Signed)
Called patient. No answer. Message left on voice mail to call [facility].

## 2009-06-26 NOTE — Telephone Encounter (Signed)
Message copied by Fayne Norrie on Tue Jun 26, 2009 11:55 AM  ------       Message from: Don Broach       Created: Tue Jun 26, 2009 11:33 AM       Regarding: return call       Contact: [phone]         Pt is returning Nurse [place] phone call and would like Rosa to call her back. Pt state to please try cell phone number.              Cell: [phone]

## 2009-06-26 NOTE — Telephone Encounter (Signed)
TC to the Pt, Pt states that she was contacted by the Psych department at [facility].  Pt has an appointment with Amalia Greenhouse on 07-24-09.  Will notify PCP.

## 2009-07-24 ENCOUNTER — Ambulatory Visit (HOSPITAL_BASED_OUTPATIENT_CLINIC_OR_DEPARTMENT_OTHER): Payer: MEDICARE | Admitting: Clinical

## 2009-07-24 DIAGNOSIS — F603 Borderline personality disorder: Principal | ICD-10-CM
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
## 2009-07-25 ENCOUNTER — Telehealth (HOSPITAL_BASED_OUTPATIENT_CLINIC_OR_DEPARTMENT_OTHER): Payer: Self-pay | Admitting: Clinical

## 2009-07-25 NOTE — Telephone Encounter (Signed)
Presented case to team and informed there is no capacity for new pts to be picked up.  The consensus was to inform pt to remain in tx with provider she has been working with both for reasons of lack of time in clinician's schedule to see new pts and inability to provide pt with long tem ongoing therapy.    Phoned pt and left her vm message stating the above.  Suggested if she has any questions, she can feel free to contact me.

## 2009-09-04 ENCOUNTER — Telehealth (HOSPITAL_BASED_OUTPATIENT_CLINIC_OR_DEPARTMENT_OTHER): Payer: Self-pay | Admitting: Psychologist

## 2009-09-04 NOTE — Telephone Encounter (Signed)
Pt called me yesterday, leaving a message to say she was making progress on finding a new therapist;  Zachary George, Ph.D., has been recommended to her as a therapist; and she wanted to know if I knew him and if I thought he would be a good match for her. I called pt back, leaving a message to confirm I did know Dr. Pollyann Kennedy and I did think he was a good match. I reminded her that he was someone I had thought of when she first consulted me to try to find a new therapist and I had called him then, but he was not available to see her at that time, so I was glad he was now available. Pt called me back today, leaving a message to say she thought his name had sounded familiar and now she knows why and to thank me for all my help and also my enthusiastic response.

## 2009-09-11 ENCOUNTER — Encounter (HOSPITAL_BASED_OUTPATIENT_CLINIC_OR_DEPARTMENT_OTHER): Payer: Self-pay | Admitting: Internal Medicine

## 2009-09-21 ENCOUNTER — Ambulatory Visit (HOSPITAL_BASED_OUTPATIENT_CLINIC_OR_DEPARTMENT_OTHER): Payer: Self-pay | Admitting: Internal Medicine

## 2009-09-21 LAB — MA SCREENING MAMMO BILATERAL WITH CAD

## 2010-01-09 ENCOUNTER — Ambulatory Visit (HOSPITAL_BASED_OUTPATIENT_CLINIC_OR_DEPARTMENT_OTHER): Payer: MEDICARE | Admitting: Psychologist

## 2010-01-09 DIAGNOSIS — F449 Dissociative and conversion disorder, unspecified: Secondary | ICD-10-CM
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
## 2010-01-18 ENCOUNTER — Ambulatory Visit (HOSPITAL_BASED_OUTPATIENT_CLINIC_OR_DEPARTMENT_OTHER): Payer: MEDICARE | Admitting: Psychologist

## 2010-01-18 DIAGNOSIS — F449 Dissociative and conversion disorder, unspecified: Secondary | ICD-10-CM
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
## 2010-01-29 ENCOUNTER — Ambulatory Visit (HOSPITAL_BASED_OUTPATIENT_CLINIC_OR_DEPARTMENT_OTHER): Payer: MEDICARE | Admitting: Optometry

## 2010-01-29 ENCOUNTER — Ambulatory Visit (HOSPITAL_BASED_OUTPATIENT_CLINIC_OR_DEPARTMENT_OTHER): Payer: PRIVATE HEALTH INSURANCE | Admitting: Optometry

## 2010-01-29 DIAGNOSIS — H524 Presbyopia: Secondary | ICD-10-CM

## 2010-01-29 DIAGNOSIS — H52209 Unspecified astigmatism, unspecified eye: Secondary | ICD-10-CM

## 2010-01-29 DIAGNOSIS — H269 Unspecified cataract: Secondary | ICD-10-CM

## 2010-01-29 DIAGNOSIS — H5210 Myopia, unspecified eye: Secondary | ICD-10-CM

## 2010-01-29 NOTE — Nursing Note (Signed)
>>   Brianna [name]     Tue Jan 29, 2010 11:04 AM  Has been monovision with OS for near. But today ptn has regular distance CLs : OU.  Says has trouble with the OS monovision.

## 2010-01-29 NOTE — Progress Notes (Signed)
I had the pleasure of seeing 54 year old Brianna [name] at the [facility] on 01/29/2010.   The summary of findings of the comprehensive eye exam are following:    Assessment:  1.  Myopia  Astigmatism  Presbyopia    2. Mild NS OU.    Plan:    1. Patient does have to wear glasses.  Glasses should be worn full time.    2. Return to clinic in one year for full exam.

## 2010-02-04 ENCOUNTER — Ambulatory Visit (HOSPITAL_BASED_OUTPATIENT_CLINIC_OR_DEPARTMENT_OTHER): Payer: MEDICARE | Admitting: Psychologist

## 2010-02-04 DIAGNOSIS — F449 Dissociative and conversion disorder, unspecified: Secondary | ICD-10-CM
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
## 2010-02-27 ENCOUNTER — Ambulatory Visit (HOSPITAL_BASED_OUTPATIENT_CLINIC_OR_DEPARTMENT_OTHER): Payer: MEDICARE | Admitting: Psychologist

## 2010-02-27 ENCOUNTER — Ambulatory Visit (HOSPITAL_BASED_OUTPATIENT_CLINIC_OR_DEPARTMENT_OTHER): Payer: MEDICARE | Admitting: Optometry

## 2010-02-27 DIAGNOSIS — F449 Dissociative and conversion disorder, unspecified: Principal | ICD-10-CM
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
## 2010-03-05 ENCOUNTER — Encounter (HOSPITAL_BASED_OUTPATIENT_CLINIC_OR_DEPARTMENT_OTHER): Payer: PRIVATE HEALTH INSURANCE

## 2010-03-05 ENCOUNTER — Ambulatory Visit (HOSPITAL_BASED_OUTPATIENT_CLINIC_OR_DEPARTMENT_OTHER): Payer: PRIVATE HEALTH INSURANCE | Admitting: Optometry

## 2010-03-05 DIAGNOSIS — H524 Presbyopia: Principal | ICD-10-CM

## 2010-03-05 NOTE — Nursing Note (Signed)
>>   Brianna [name], OD     Tue Mar 05, 2010  9:19 AM  Pt was given two options to try:  1) monovision with AO    OD: -2.75/8.4/14.0    OS: -6.00-0.75x180 8.6/14.5    2) AO Prebyopia OU.   OD: -5.00 with High add  OS:: -6.25 with mid add.    Pt prefers monovision over the bifocals because sharper distance vision.

## 2010-04-02 ENCOUNTER — Telehealth (HOSPITAL_BASED_OUTPATIENT_CLINIC_OR_DEPARTMENT_OTHER): Payer: Self-pay | Admitting: Psychologist

## 2010-04-02 NOTE — Telephone Encounter (Signed)
Have talked to pt a few times by phone with follow up issues: Tomma Rakers, LICSW, did not have availability to see pt; referred pt to Gustavus Bryant, Ph.D., at [facility] whom pt called, but Dr. Izetta Dakin did not think they would be a good match in terms of Dr. Abundio Miu expertise and what pt is looking for; pt decided I should not call Holley Raring, PsyD, yet since I am not recommending her personally, but only from what I have heard; I have encouraged pt to think about continuing with Zachary George, Ph.D., whom she is continuing to see in the meantime, as they are continuing to work together and work through issues. Pt and I have another meeting coming up in early Sept and will touch base then.

## 2010-04-22 ENCOUNTER — Ambulatory Visit (HOSPITAL_BASED_OUTPATIENT_CLINIC_OR_DEPARTMENT_OTHER): Payer: MEDICARE | Admitting: Psychologist

## 2010-04-22 DIAGNOSIS — F449 Dissociative and conversion disorder, unspecified: Principal | ICD-10-CM
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
## 2010-04-23 ENCOUNTER — Other Ambulatory Visit (HOSPITAL_BASED_OUTPATIENT_CLINIC_OR_DEPARTMENT_OTHER): Payer: Self-pay | Admitting: Social Worker

## 2010-04-23 NOTE — Telephone Encounter (Signed)
Thank you for your referral. Your patient will be presented at the next Dispo Meeting..

## 2010-04-26 NOTE — Telephone Encounter (Signed)
Final Dispo.: Thank you for your referral. Your patient has been assigned to Dr. Paul Simone for further evaluation and treatment.

## 2010-05-15 ENCOUNTER — Ambulatory Visit (HOSPITAL_BASED_OUTPATIENT_CLINIC_OR_DEPARTMENT_OTHER): Payer: MEDICARE | Admitting: Clinical

## 2010-05-15 DIAGNOSIS — F3289 Other specified depressive episodes: Secondary | ICD-10-CM

## 2010-05-15 DIAGNOSIS — F329 Major depressive disorder, single episode, unspecified: Principal | ICD-10-CM
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
## 2010-05-23 ENCOUNTER — Ambulatory Visit (HOSPITAL_BASED_OUTPATIENT_CLINIC_OR_DEPARTMENT_OTHER): Payer: MEDICARE | Admitting: Clinical

## 2010-05-23 DIAGNOSIS — F3289 Other specified depressive episodes: Secondary | ICD-10-CM

## 2010-05-23 DIAGNOSIS — F329 Major depressive disorder, single episode, unspecified: Secondary | ICD-10-CM

## 2010-05-23 DIAGNOSIS — F449 Dissociative and conversion disorder, unspecified: Principal | ICD-10-CM
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
## 2010-05-30 ENCOUNTER — Ambulatory Visit (HOSPITAL_BASED_OUTPATIENT_CLINIC_OR_DEPARTMENT_OTHER): Payer: MEDICARE | Admitting: Clinical

## 2010-05-30 DIAGNOSIS — F449 Dissociative and conversion disorder, unspecified: Secondary | ICD-10-CM

## 2010-05-30 DIAGNOSIS — F329 Major depressive disorder, single episode, unspecified: Secondary | ICD-10-CM

## 2010-05-30 DIAGNOSIS — F3289 Other specified depressive episodes: Secondary | ICD-10-CM
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
## 2010-06-06 ENCOUNTER — Ambulatory Visit (HOSPITAL_BASED_OUTPATIENT_CLINIC_OR_DEPARTMENT_OTHER): Payer: MEDICARE | Admitting: Clinical

## 2010-06-06 DIAGNOSIS — F329 Major depressive disorder, single episode, unspecified: Secondary | ICD-10-CM

## 2010-06-06 DIAGNOSIS — F449 Dissociative and conversion disorder, unspecified: Secondary | ICD-10-CM

## 2010-06-06 DIAGNOSIS — F3289 Other specified depressive episodes: Secondary | ICD-10-CM
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
## 2010-06-13 ENCOUNTER — Ambulatory Visit (HOSPITAL_BASED_OUTPATIENT_CLINIC_OR_DEPARTMENT_OTHER): Payer: MEDICARE | Admitting: Clinical

## 2010-06-13 DIAGNOSIS — F449 Dissociative and conversion disorder, unspecified: Secondary | ICD-10-CM
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
## 2010-06-17 ENCOUNTER — Telehealth (HOSPITAL_BASED_OUTPATIENT_CLINIC_OR_DEPARTMENT_OTHER): Payer: Self-pay | Admitting: Psychologist

## 2010-06-17 NOTE — Telephone Encounter (Signed)
Pt called last week to request meeting with me for another consultation to discuss "match" between Dr. Newell Coral and herself. Scheduled appt with her for Wed Nov 9 at 3 pm.

## 2010-06-19 ENCOUNTER — Ambulatory Visit (HOSPITAL_BASED_OUTPATIENT_CLINIC_OR_DEPARTMENT_OTHER): Payer: MEDICARE | Admitting: Psychologist

## 2010-06-19 DIAGNOSIS — F449 Dissociative and conversion disorder, unspecified: Secondary | ICD-10-CM
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
## 2010-06-20 ENCOUNTER — Ambulatory Visit (HOSPITAL_BASED_OUTPATIENT_CLINIC_OR_DEPARTMENT_OTHER): Payer: MEDICARE | Admitting: Clinical

## 2010-06-20 DIAGNOSIS — F449 Dissociative and conversion disorder, unspecified: Secondary | ICD-10-CM
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
## 2010-06-26 ENCOUNTER — Ambulatory Visit (HOSPITAL_BASED_OUTPATIENT_CLINIC_OR_DEPARTMENT_OTHER): Payer: MEDICARE | Admitting: Clinical

## 2010-06-26 DIAGNOSIS — F449 Dissociative and conversion disorder, unspecified: Principal | ICD-10-CM
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
## 2010-07-01 ENCOUNTER — Ambulatory Visit (HOSPITAL_BASED_OUTPATIENT_CLINIC_OR_DEPARTMENT_OTHER): Payer: MEDICARE | Admitting: Clinical

## 2010-07-11 ENCOUNTER — Ambulatory Visit (HOSPITAL_BASED_OUTPATIENT_CLINIC_OR_DEPARTMENT_OTHER): Payer: MEDICARE | Admitting: Clinical

## 2010-07-11 DIAGNOSIS — F449 Dissociative and conversion disorder, unspecified: Principal | ICD-10-CM
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
## 2010-07-18 ENCOUNTER — Ambulatory Visit (HOSPITAL_BASED_OUTPATIENT_CLINIC_OR_DEPARTMENT_OTHER): Payer: MEDICARE | Admitting: Clinical

## 2010-07-25 ENCOUNTER — Ambulatory Visit (HOSPITAL_BASED_OUTPATIENT_CLINIC_OR_DEPARTMENT_OTHER): Payer: MEDICARE | Admitting: Clinical

## 2010-07-25 DIAGNOSIS — F449 Dissociative and conversion disorder, unspecified: Secondary | ICD-10-CM
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
## 2010-07-29 ENCOUNTER — Ambulatory Visit (HOSPITAL_BASED_OUTPATIENT_CLINIC_OR_DEPARTMENT_OTHER): Payer: MEDICARE | Admitting: Clinical

## 2010-07-29 DIAGNOSIS — F449 Dissociative and conversion disorder, unspecified: Secondary | ICD-10-CM
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
## 2010-08-01 ENCOUNTER — Ambulatory Visit (HOSPITAL_BASED_OUTPATIENT_CLINIC_OR_DEPARTMENT_OTHER): Payer: MEDICARE | Admitting: Clinical

## 2010-08-01 DIAGNOSIS — F449 Dissociative and conversion disorder, unspecified: Principal | ICD-10-CM
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
## 2010-08-08 ENCOUNTER — Ambulatory Visit (HOSPITAL_BASED_OUTPATIENT_CLINIC_OR_DEPARTMENT_OTHER): Payer: MEDICARE | Admitting: Clinical

## 2010-08-15 ENCOUNTER — Ambulatory Visit (HOSPITAL_BASED_OUTPATIENT_CLINIC_OR_DEPARTMENT_OTHER): Payer: MEDICARE | Admitting: Clinical

## 2010-08-22 ENCOUNTER — Ambulatory Visit (HOSPITAL_BASED_OUTPATIENT_CLINIC_OR_DEPARTMENT_OTHER): Payer: MEDICARE | Admitting: Clinical

## 2010-08-29 ENCOUNTER — Ambulatory Visit (HOSPITAL_BASED_OUTPATIENT_CLINIC_OR_DEPARTMENT_OTHER): Payer: MEDICARE | Admitting: Clinical

## 2010-09-05 ENCOUNTER — Ambulatory Visit (HOSPITAL_BASED_OUTPATIENT_CLINIC_OR_DEPARTMENT_OTHER): Payer: MEDICARE | Admitting: Clinical

## 2010-09-12 ENCOUNTER — Ambulatory Visit (HOSPITAL_BASED_OUTPATIENT_CLINIC_OR_DEPARTMENT_OTHER): Payer: MEDICARE | Admitting: Clinical

## 2010-10-15 ENCOUNTER — Telehealth (HOSPITAL_BASED_OUTPATIENT_CLINIC_OR_DEPARTMENT_OTHER): Payer: Self-pay | Admitting: Psychologist

## 2010-10-15 NOTE — Telephone Encounter (Signed)
Pt called to give me an update on a paper she was writing and to let me know that she was now in therapy with Orrin Brigham and it was working well. I called her back, reached her directly, we touched base, and she felt good to be able to tell me some "good news" [in contrast to her previous reasons for calling me].

## 2010-10-28 ENCOUNTER — Encounter (HOSPITAL_BASED_OUTPATIENT_CLINIC_OR_DEPARTMENT_OTHER): Payer: PRIVATE HEALTH INSURANCE

## 2010-12-05 ENCOUNTER — Encounter (HOSPITAL_BASED_OUTPATIENT_CLINIC_OR_DEPARTMENT_OTHER): Payer: Self-pay | Admitting: Internal Medicine

## 2010-12-05 ENCOUNTER — Ambulatory Visit (HOSPITAL_BASED_OUTPATIENT_CLINIC_OR_DEPARTMENT_OTHER): Payer: MEDICARE | Admitting: Internal Medicine

## 2010-12-05 VITALS — BP 110/70 | HR 72 | Temp 99.1°F | Wt 114.0 lb

## 2010-12-05 DIAGNOSIS — Z0000 Encounter for general adult medical examination without abnormal findings: Principal | ICD-10-CM

## 2010-12-05 DIAGNOSIS — F4310 Post-traumatic stress disorder, unspecified: Secondary | ICD-10-CM

## 2010-12-05 DIAGNOSIS — Z124 Encounter for screening for malignant neoplasm of cervix: Secondary | ICD-10-CM

## 2010-12-05 DIAGNOSIS — L570 Actinic keratosis: Secondary | ICD-10-CM

## 2010-12-05 DIAGNOSIS — N951 Menopausal and female climacteric states: Secondary | ICD-10-CM

## 2010-12-05 NOTE — Progress Notes (Signed)
Brianna [name] is a 55 year old woman  Here for her annual physical.    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  All.: Penicillins     Generally, at baseline re: energy level, appetite and sleep, mood and memory/concentration: struggles w all / chronic dysthymic; in therapy; diagnostic issues emerge as they have in the past in therapy - she also wd like to discuss Q of medication, but needs more time than just few minutes - ?C/L?   She is stable, in therapy at this time.  Not troubled by headaches.  No concerns re: eyes, vision. No active dental problems.   Breathing: normal; no unexplained shortness of breath or cough. No symptoms in chest - no chest pressure or any other new or uncomfortable  sensations. GI: no issues at present. No changes in bowels.   No pelvic pain -   +genitourinary symptoms: some urgency / frequency - daytimes - . Not troubled by nocturia.  GYN: she is postmenopausal.  Gynecologic or vulvovaginal symptoms: none.  She does still get hot flashes, not very troublesome.    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  [place] in [place].  Lives alone.  Single since '05.  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        Other issues:  Working in therapy.  Skin: rough patch on nose.  Red bump on upper chest over sterno-clavicular junction.  Self-care - walks.    Having flashbacks? - with no connection to a memory.    OBJECTIVE:   BP 110/70  Pulse 72  Temp(Src) 99.1 F (37.3 C) (Temporal)  Wt 114 lb (51.71 kg)  SpO2 99%   Alert, ambulatory, attentive, appropriate.   Affect normal, tears up at times.  HEENT: Scalp normal. Conjunctivae and sclerae are normal.  Oropharynx normal.  Neck is supple without masses, no cervical lymphadenopathy.  No thyromegaly.  Back without deformity.  Chest is clear to auscultation.   Heart sounds: normal S1 and S2, regular rate and rhythm, no murmurs.  Breasts: normal without suspicious masses, skin or nipple changes or axillary nodes.   Abdomen is soft, no tenderness or masses, no appreciable organomegaly.    Pelvic: No inguinal lymphadenopathy.  No lesions observed on mons, inner thighs, perineum. Labia majora and minora normal/atrophic.  Urethral meatus normal.   Vagina normal/atrophic, no discharge seen.   Cervix normal, without lesions.  Bimanual exam: somewhat limited by discomfort.  Uterus normal in size and shape without tenderness.  Adnexae are without any appreciable masses, no tenderness on palpation.  Extremities are normal, without edema.   Skin: 1-mm rough patch on bridge of nose consistent with actinic keratosis.  otherwise normal without concerning lesions noted.    Last few blood pressures:  Most Recent BP Reading(s)  12/05/10 : 110/70  11/15/08 : 100/78  10/11/08 : 114/82  Labs:    LDL (mg/dl)   Date     Date  Value    11/26/2007  139*   ----------    HDL (mg/dl)   Date     Date  Value    11/26/2007  47      ALANINE AMINOTRANSFERASE (IU/L)   Date     Date  Value    11/26/2007  19      HEMATOCRIT (%)   Date     Date  Value     11/26/2007  41.3      THYROID SCREEN TSH (uIU/mL)   Date     Date  Value    11/26/2007  0.58    ----------    ASSESSMENT/PLAN:   V70.0 Routine general medical examination at a health care facility  (primary encounter diagnosis)  702.0 Actinic keratosis- to derm.  V76.68F Screening for cervical cancer done.  309.81 Posttraumatic stress disorder / in therapy: as per request, psych C/L app't.  627.2 Symptomatic menopausal or female climacteric states - discussed.  Rvwd exercise, stress management, herbals, alpha-blockers and HRT, but she feels no need for intervention.  Also:  Preventive health care:  Reviewed: ways to improve diet, keep a healthy weight, maintain a regular exercise routine. Discussed ways that she can reduce her risk of neurovascular, cardiovascular, and renal disease.     1. The patient indicates understanding of these issues and agrees with the plan.  2. I reviewed the patient's medical information and medical history   3.  I reconciled the patient's medication list and prepared and supplied needed refills.  4.  I have reviewed the past medical, family, and social history sections including the medications and allergies listed in the above medical record    Electronically signed by: Isla Pence, MD, 12/05/2010 10:54 AM

## 2010-12-06 ENCOUNTER — Encounter (HOSPITAL_BASED_OUTPATIENT_CLINIC_OR_DEPARTMENT_OTHER): Payer: Self-pay | Admitting: Internal Medicine

## 2010-12-10 ENCOUNTER — Ambulatory Visit (HOSPITAL_BASED_OUTPATIENT_CLINIC_OR_DEPARTMENT_OTHER): Payer: Self-pay | Admitting: Internal Medicine

## 2010-12-11 LAB — HUMAN PAPILLOMAVIRUS (HPV): HUMAN PAPILLOMAVIRUS: NEGATIVE

## 2010-12-12 LAB — CYTOPATH, C/V, THIN LAYER

## 2010-12-18 ENCOUNTER — Ambulatory Visit (HOSPITAL_BASED_OUTPATIENT_CLINIC_OR_DEPARTMENT_OTHER): Payer: Self-pay | Admitting: Dentistry

## 2010-12-18 DIAGNOSIS — Z0120 Encounter for dental examination and cleaning without abnormal findings: Secondary | ICD-10-CM

## 2010-12-19 LAB — MA SCREENING MAMMO BILATERAL WITH CAD

## 2010-12-24 ENCOUNTER — Ambulatory Visit (HOSPITAL_BASED_OUTPATIENT_CLINIC_OR_DEPARTMENT_OTHER): Payer: Self-pay | Admitting: Internal Medicine

## 2010-12-24 LAB — MA DIAGNOSTIC MAMMO UNILATERAL LEFT WITH CAD

## 2010-12-29 NOTE — Progress Notes (Addendum)
Lab: insufficient cells, advise estrogen then rpt.  Notes:  Estrogen-only vaginal creams, tablets, or rings: types, dosing, average monthly cost:  Conjugated equine estrogen: Premarin Cream, 0.625 mg/gram: 0.5 to 2 grams a day, $25-100  Estradiol: Estrace Cream, 1.5 mg/gram, Start: 2 g PV qd x 2wk, $50-150.   Estradiol vaginal tablet: VagiFem, 0.025 mg: 1 tab inserted PV daily for two weeks then 10 a month, $42-$93  Estradiol ring: FemRing: 0.05-0.1 mg per 24 hours. One every 3 months, $51-$55  Estradiol ring: Estring, 0.0075 mg per 24 hours. One every 3 months, $53

## 2011-01-07 ENCOUNTER — Telehealth (HOSPITAL_BASED_OUTPATIENT_CLINIC_OR_DEPARTMENT_OTHER): Payer: Self-pay | Admitting: Registered Nurse

## 2011-01-07 NOTE — Telephone Encounter (Signed)
Isla Pence - pls can you call her: ','<<< Less Detail   From Isla Pence   Sent Tuesday Jan 07, 2011   To [facility]   Subject pls can you call her:   Flags Call patient   Patient Brianna [name] [[id number]] (DOB: 1956-01-31)   Phone Pt Work Pt Home Sender     [phone]           Message Please can you call her?   She should have received my letter by now - re: estrogen treatment for "pap insufficient cells" - does she either want to start one of the treatments, or schedule an app't. w me to talk it over?  Many thanks,  Arna Medici

## 2011-01-07 NOTE — Telephone Encounter (Signed)
FYI  Spoke with patient. Message relayed from Dr. Creed Copper, letter reviewed with patient.   Patient has appointment on 01/13/11, will discuss options for repeat pap during visit.

## 2011-01-13 ENCOUNTER — Ambulatory Visit (HOSPITAL_BASED_OUTPATIENT_CLINIC_OR_DEPARTMENT_OTHER): Payer: MEDICAID | Admitting: Internal Medicine

## 2011-01-13 ENCOUNTER — Ambulatory Visit (HOSPITAL_BASED_OUTPATIENT_CLINIC_OR_DEPARTMENT_OTHER): Payer: Self-pay | Admitting: Dental Hygienist

## 2011-01-13 VITALS — BP 110/70 | HR 60 | Temp 97.9°F | Wt 116.0 lb

## 2011-01-13 DIAGNOSIS — K036 Deposits [accretions] on teeth: Principal | ICD-10-CM

## 2011-01-13 DIAGNOSIS — N952 Postmenopausal atrophic vaginitis: Principal | ICD-10-CM

## 2011-01-13 DIAGNOSIS — G479 Sleep disorder, unspecified: Secondary | ICD-10-CM

## 2011-01-13 NOTE — Progress Notes (Signed)
[name] is a 55 year old patient  Here today re:   1. ?sleep apnea?  She has woken herself up w a snore or a snort?  But does not have daytime sleepiness / nodding off;  Not told that she snores.    2. Re: atrophic / insuffic cells: she is loath to use estr tx just to get a pap done.  What are the options here?  What are recommended freq's of paps?    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  [place] in [place].  Lives alone.  Single since '05.  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         Smoking Status: Never Smoker                      Alcohol Use: No                 No current outpatient prescriptions on file.    Allergy History: Penicillins    Health maintenance: due for: No health maintenance topics applied.      PHYSICAL EXAMINATION:  BP 110/70  Pulse 60  Temp(Src) 97.9 F (36.6 C) (Temporal)  Wt 116 lb (52.617 kg)  SpO2 98%   Comfortable-appearing; alert, ambulatory, attentive.  counselling-oriented visit.     Pap 11/26/07: pap nl, HPV neg.  Pap 12/05/10: pap insuffic, HPV neg.    ASSESSMENT/PLAN:   627.3B Atrophic vaginitis  (primary encounter diagnosis): discussed options with her.  Reviewed new recommendations of the USPSTF, as per below: she may safely have pap/HPV q 5 yrs.  She wd like to do this.  She likes the idea of using estr topically BEFORE the pap, for easier test and better yield.  Will plan for this.  780.50E Sleep disorder - she sometimes  has disrupted sleep, sometimes feels tired or depressed, but Epworth Sleepiness Scale is so low, and reluctance to consider cpap so great, that she opts not to pursue Q of OSA testing.  Release Date: March 2012  These recommendations apply to women who have a cervix, regardless of sexual history. These recommendations do not apply to women who have received a diagnosis of a high-grade precancerous cervical lesion or cervical cancer, women with in utero exposure to diethylstilbestrol, or women who are immunocompromised (such as those who are HIV positive).  The USPSTF recommends screening for cervical cancer in women ages 85 to 39 years with cytology (Pap smear) every 3 years or, for women ages 54 to 9 years who want to lengthen the screening interval, screening with a combination of cytology and human papillomavirus (HPV) testing every 5 years. See the Clinical Considerations for discussion of cytology method, HPV testing, and screening interval.  Grade: A Recommendation.     1. The patient indicates understanding of these issues and  agrees with the plan.  2. Past medical history, medications, problem list, allergies, family history, health habits, social history: reviewed, updated, as noted in EPIC.   3. I reconciled the patient's medication list and prepared and supplied needed refills.  Counselling-oriented visit, 25 minutes spent face-to-face with patient, of which greater than 50% was direct counselling; topics covered included discussing diagnoses, reviewing typical symptoms and  approaches to workup, and options for management/treatment.   Electronically signed by: Loann Quill MD, 01/13/2011 11:15 AM

## 2011-01-14 ENCOUNTER — Ambulatory Visit (HOSPITAL_BASED_OUTPATIENT_CLINIC_OR_DEPARTMENT_OTHER): Payer: MEDICARE | Admitting: Psychiatry

## 2011-01-14 DIAGNOSIS — F4310 Post-traumatic stress disorder, unspecified: Secondary | ICD-10-CM
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
## 2011-01-14 NOTE — Patient Instructions (Signed)
Consider a serotonergic agent which treats PTSD and MDD    1. Lexapro  2. Zoloft  3. Paxil    Or other meds would be    1. Effexor (venlafaxine)  2. Remeron

## 2011-01-17 ENCOUNTER — Ambulatory Visit (HOSPITAL_BASED_OUTPATIENT_CLINIC_OR_DEPARTMENT_OTHER): Payer: PRIVATE HEALTH INSURANCE

## 2011-02-24 ENCOUNTER — Ambulatory Visit (HOSPITAL_BASED_OUTPATIENT_CLINIC_OR_DEPARTMENT_OTHER): Payer: Self-pay | Admitting: Dentist

## 2011-05-05 ENCOUNTER — Encounter (HOSPITAL_BASED_OUTPATIENT_CLINIC_OR_DEPARTMENT_OTHER): Payer: Self-pay

## 2011-06-02 ENCOUNTER — Ambulatory Visit (HOSPITAL_BASED_OUTPATIENT_CLINIC_OR_DEPARTMENT_OTHER): Payer: MEDICARE | Admitting: Clinical

## 2011-06-02 DIAGNOSIS — F4310 Post-traumatic stress disorder, unspecified: Secondary | ICD-10-CM
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
## 2011-07-16 ENCOUNTER — Telehealth (HOSPITAL_BASED_OUTPATIENT_CLINIC_OR_DEPARTMENT_OTHER): Payer: Self-pay | Admitting: Psychologist

## 2011-07-16 NOTE — Telephone Encounter (Signed)
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 I called her back and reached her in person. We spoke briefly about her fellowship, how much she enjoys it, and also how helpful it has been to her personally to hear things from clinicians' points of views. I told pt I was happy to hear from her and glad to hear of good news.

## 2011-07-17 ENCOUNTER — Encounter (HOSPITAL_BASED_OUTPATIENT_CLINIC_OR_DEPARTMENT_OTHER): Payer: Self-pay

## 2011-07-31 ENCOUNTER — Ambulatory Visit (HOSPITAL_BASED_OUTPATIENT_CLINIC_OR_DEPARTMENT_OTHER): Payer: PRIVATE HEALTH INSURANCE

## 2011-12-25 ENCOUNTER — Emergency Department (HOSPITAL_BASED_OUTPATIENT_CLINIC_OR_DEPARTMENT_OTHER)
Admission: RE | Admit: 2011-12-25 | Disposition: A | Discharge status: Home / Self Care | Payer: Self-pay | Source: Emergency Department | Attending: Emergency Medicine | Admitting: Emergency Medicine

## 2011-12-25 MED ORDER — ACETAMINOPHEN 325 MG PO TABS
650.0000 mg | ORAL_TABLET | Freq: Once | ORAL | Status: DC
Start: 2011-12-25 — End: 2011-12-25

## 2011-12-25 NOTE — Discharge Instructions (Signed)
Please take OTC ibuprofen 400 mg every 6 hours, alternating with Tylenol 650 mg every 6 hours, as needed for foot pain.  Also elevate your leg and apply warm or cold compresses.    Please call your PCP or return to the ED if you do not achieve good pain control with the above medications.

## 2011-12-25 NOTE — ED Provider Notes (Addendum)
HPI  The patient is 56 yo F w/ pmh of osteopenia and depression, here for foot pain. She fell and twisted her foot at 2 pm, 6 hours prior to this ED visit. The foot was not tender spontaneously, but soon she found that she had foot pain when trying to walk. The patient also noted swelling of her injured foot and decided to have the foot examined in the ED.  The patient denies hitting her head during the fall, or LOC. She denies any injuries of other sites of her body.  Denies lightheadedness or dizziness preceding this fall.    ROS: no chest pain/palpitations; no abdominal pain, N/V/D/C; no dysuria.        Past Medical History    MAJOR DEPRESSIVE DISORDER, SINGLE EPISODE     Disorder of bone and cartilage, unspecified BMD 8/04    Comment: Osteopenia only, femoral neck.    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 MAH: EGD, Cscopy 09/09/06: all normal. 2/08: rslvd.         Past Surgical History    EXCISION TONSIL TAGS      IMPLANT MESH OPN HERNIA RPR/DEBRIDEMENT CLOSURE      Comment inguinal hernia       Review of Patient's Allergies indicates:   Penicillins             Hives    Comment:childhood.      Medications:  OTC fish oil      Physical Exam:  Temp: 97.1 F ; Pulse: 80 ; Resp: 18 ; BP: 120/72 mmHg, SpO2: 100 %  General appearance - slender female, in no distress due to pain  Skin - no ecchymoses, erythema or lacerations are visible on the right foot  Head - Normocephalic. No masses, lesions, tenderness or abnormalities  Extremities - No deformities of right foot compared to left side. There is soft tissue swelling on the lateral aspect of the right midfoot and at the base of the 5th metatarsal, with T on palpation. There is no tenderness of the right lateral or medial maleoli.  Musculoskeletal - Moves all 4 digits of R foot.  Peripheral pulses - palpable pulses: positive at dorsalis pedis a.  Neuro - Gait normal. Sensation preserved for R foot       Relevant  Studies:  Right foot and ankle X-ray on 12/25/2011: reveals no fractures.    ED Course and Decision Making:  This is a 56 yo F w/ pmh of osteopenia, here for foot sprain. X-rays of the right ankle and midfoot were obtained to rule out bone fractures, per [place] criteria. No fractures were detected. The patient was discharged home with instruction to take OTC ibuprofen 400 mg every 6 hours, alternating with Tylenol 650 mg every 6 hours, as needed for foot pain.   She was also advised to elevate the right foot and apply warm or cold compresses.   Was asked to call her PCP or return to the ED if she did not achieve adequate pain control with the above medication regimen. The patient agreed with the plan.    Darlina Rumpf  PGY-3  Pg [phone]    Attending note  Seen with the resident, agree with exam and evaluation    The patient is a 56 year old female, coming in with left foot pain after a twisting mechanism several hours prior.  The patient states that she was walking and tripped.  She denies any  preceding palpitations, lightheadedness or dizziness or chest pain.  She denies any other injuries other than pain over her right foot area. She has some mild swelling over the lateral aspect of the right foot that is tender to palpation.  No tenderness over bilateral malleoli.  No tenderness over the base of the fifth metatarsal.  She is able to ambulate, albeit with antalgic gait.  We obtained an x-ray of the foot, which was negative for acute fracture or other pathology.  The patient was advised to take Tylenol and ibuprofen, ice and elevate.  To followup with the primary care doctor in the next several days.    Noah Charon, MD

## 2011-12-25 NOTE — ED Triage Note (Signed)
Trip and fall, pain to right foot. +swelling

## 2011-12-25 NOTE — ED Notes (Signed)
Patient Disposition    Patient education for diagnosis, medications, activity, diet and follow-up.  Patient left ED 9:45 PM.  Patient rep received written instructions.  Interpreter to provide instructions: No    Discharged to: Discharged to home

## 2011-12-26 ENCOUNTER — Telehealth (HOSPITAL_BASED_OUTPATIENT_CLINIC_OR_DEPARTMENT_OTHER): Payer: Self-pay | Admitting: Internal Medicine

## 2011-12-26 LAB — XR ANKLE RIGHT MINIMUM 3 VIEWS

## 2011-12-26 NOTE — Progress Notes (Signed)
12/26/11    Called pt for ED follow up right foot sprain  (reason for ed visit)  Was a telephone interpreter used , NO  I spoke to patient she stated that she is ok, she has a little pain when she walks on her foot and that she is following the instructions of the ER doc to elevate and ice her right foot.  She would like to see Isla Pence, MD for an ER followup.  Appt scheduled for 01/01/12 with Dr. Creed Copper.  Message sent to Isla Pence, MD  Malachi Carl. Ceri Mayer, LPN

## 2012-01-01 ENCOUNTER — Ambulatory Visit (HOSPITAL_BASED_OUTPATIENT_CLINIC_OR_DEPARTMENT_OTHER): Payer: MEDICARE | Admitting: Internal Medicine

## 2012-01-01 ENCOUNTER — Encounter (HOSPITAL_BASED_OUTPATIENT_CLINIC_OR_DEPARTMENT_OTHER): Payer: Self-pay | Admitting: Internal Medicine

## 2012-01-01 VITALS — BP 106/62 | HR 88 | Temp 98.2°F | Wt 114.3 lb

## 2012-01-01 DIAGNOSIS — R5383 Other fatigue: Secondary | ICD-10-CM

## 2012-01-01 DIAGNOSIS — Z1231 Encounter for screening mammogram for malignant neoplasm of breast: Secondary | ICD-10-CM

## 2012-01-01 DIAGNOSIS — IMO0002 Reserved for concepts with insufficient information to code with codable children: Secondary | ICD-10-CM

## 2012-01-01 LAB — THYROID SCREEN TSH REFLEX FT4: THYROID SCREEN TSH REFLEX FT4: 0.67 u[IU]/mL (ref 0.34–5.60)

## 2012-01-01 LAB — BASIC METABOLIC PANEL
ANION GAP: 8 mmol/L (ref 3–11)
BUN (UREA NITROGEN): 16 mg/dl (ref 6–20)
CALCIUM: 9.3 mg/dl (ref 8.6–10.3)
CARBON DIOXIDE: 29 mmol/L (ref 22–32)
CHLORIDE: 105 mmol/L (ref 101–111)
CREATININE: 0.7 mg/dl (ref 0.4–1.2)
ESTIMATED GLOMERULAR FILT RATE: >60 mL/min (ref >60)
Glucose Random: 83 mg/dl (ref 74–160)
POTASSIUM: 4.2 mmol/L (ref 3.5–5.1)
SODIUM: 142 mmol/L (ref 135–144)

## 2012-01-01 LAB — CORTISOL RANDOM: CORTISOL RANDOM: 6.1 ug/dl (ref 3.1–22.4)

## 2012-01-01 NOTE — Progress Notes (Signed)
Brianna [name] is a 56 year old patient  Here today re:   F/u, ankle sprain - seen in ED; films done - no fx.  She can bear wt OK  No or minimal swelling  No numbness or weakness    Of more concern to her is:  Wd like to get thyroid studies done.  Tired, Dry hair, etc.  Reports severe morning fatigue - often not until noon that she can really get going.  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  Re:   Sleep: varies day to day.. Sometimes fine, sometimes disrupted - mostly gets enough though.  Does not think this is manifestation of depression.  Feels different.  Just generally really really tired.  Also wonders about adrenal function and wonders if can check cortisol.    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  Previously in [place].  Lives alone.  Single since '05.  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         Smoking Status: Never Smoker                      Alcohol Use: No                 No current outpatient prescriptions on file.  No current facility-administered medications for this visit.  Reports that she takes meds as directed, list corresponds with meds taken  No difficulties with pharmacy, co-pays or insurance  No difficulties with side-effects  Allergy History: Penicillins    Health maintenance: due for: Health Care Proxy due on 07/03/1972  Physical Exam (Age 27+) due on 12/05/2011  Mammography( Yearly) due on 12/19/2011      PHYSICAL EXAMINATION:  BP 106/62  Pulse 88  Temp(Src) 98.2 F (36.8 C) (Temporal)  Wt 114 lb 4.8 oz (51.846  kg)  BMI 20.25 kg/m2  SpO2 99%   Comfortable-appearing; alert, ambulatory, attentive.  MENTAL STATUS EXAM:   Appearance: normal grooming, neatly dressed.  Gait/station/psychomotor activity: WNL. No significant psychomotor agitation/retardation noted; no tics/tremors or other psychomotor abnormalities noted.   Behavior: cooperative/engaged   Alertness: Alert   Speech/language: normal. No pressured or tangential speech.  No evident difficulties re: give-and-take of conversation.  Mood: "OK"   Affect: Congruent to conversation. full affective range.  Thought Process: linear and logical   Thought Content: focused on concerns about fatigue   Perceptions: wnl   Judgment/Impulse Control: appears within normal/good   Insight: appears good   Cognition/attention/memory: grossly intact, able to provide accurate history     Feet/ankles: R foot: slightly tender to palpation distal to lateral malleolus.    ASSESSMENT/PLAN:   1. Fatigue    Discussed ddx including stress, depression, overwork, not getting enough exercise or not getting enough sleep -   - Thyroid Screen TSH   -- Basic Metabolic Panel   - Cortisol Random  -  and f/u 2 weeks.    2. Sprain of ankle/foot   Doing well- no intervention needed.    3. Other screening mammogram    - MAM Order For Mammogram - Screening     Counselling-oriented visit, 25 minutes spent face-to-face with patient, of which greater than 50% was direct counselling; topics covered included discussing diagnoses, reviewing typical symptoms and  approaches to workup, and options for management/treatment.     1. The patient indicates understanding of these issues and agrees with the plan.  2. Past medical history, medications, problem list, allergies, family history, health habits, social history: reviewed, updated, as noted in EPIC.   3. I reconciled the patient's medication list and prepared and supplied needed refills.    Electronically signed by: Loann Quill MD, 01/01/2012 12:07 PM

## 2012-01-08 ENCOUNTER — Ambulatory Visit (HOSPITAL_BASED_OUTPATIENT_CLINIC_OR_DEPARTMENT_OTHER): Payer: Self-pay | Admitting: Internal Medicine

## 2012-01-08 LAB — MA SCREENING MAMMO BILATERAL WITH CAD

## 2012-01-22 ENCOUNTER — Encounter (HOSPITAL_BASED_OUTPATIENT_CLINIC_OR_DEPARTMENT_OTHER): Payer: Self-pay

## 2012-01-22 NOTE — Progress Notes (Signed)
Patient left without being seen. (provider 1/2-hr behind).  Called her, apologized, asked her to re-schedule, will happily see her within next 3-4 weeks.

## 2012-01-23 ENCOUNTER — Ambulatory Visit (HOSPITAL_BASED_OUTPATIENT_CLINIC_OR_DEPARTMENT_OTHER): Payer: MEDICARE | Admitting: Internal Medicine

## 2012-01-23 ENCOUNTER — Encounter (HOSPITAL_BASED_OUTPATIENT_CLINIC_OR_DEPARTMENT_OTHER): Payer: Self-pay | Admitting: Internal Medicine

## 2012-02-26 ENCOUNTER — Ambulatory Visit (HOSPITAL_BASED_OUTPATIENT_CLINIC_OR_DEPARTMENT_OTHER): Payer: MEDICARE | Admitting: Internal Medicine

## 2012-02-26 ENCOUNTER — Encounter (HOSPITAL_BASED_OUTPATIENT_CLINIC_OR_DEPARTMENT_OTHER): Payer: Self-pay | Admitting: Internal Medicine

## 2012-02-26 VITALS — BP 106/72 | HR 79 | Temp 99.0°F | Ht 63.0 in | Wt 113.0 lb

## 2012-02-26 DIAGNOSIS — R5381 Other malaise: Secondary | ICD-10-CM

## 2012-02-26 DIAGNOSIS — Z0000 Encounter for general adult medical examination without abnormal findings: Secondary | ICD-10-CM

## 2012-02-26 DIAGNOSIS — IMO0001 Reserved for inherently not codable concepts without codable children: Secondary | ICD-10-CM

## 2012-02-26 DIAGNOSIS — R5383 Other fatigue: Secondary | ICD-10-CM
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
## 2012-03-17 ENCOUNTER — Ambulatory Visit (HOSPITAL_BASED_OUTPATIENT_CLINIC_OR_DEPARTMENT_OTHER): Payer: MEDICARE | Admitting: Optometry

## 2012-03-17 ENCOUNTER — Ambulatory Visit (HOSPITAL_BASED_OUTPATIENT_CLINIC_OR_DEPARTMENT_OTHER): Payer: PRIVATE HEALTH INSURANCE | Admitting: Optometry

## 2012-03-17 DIAGNOSIS — H5210 Myopia, unspecified eye: Principal | ICD-10-CM

## 2012-03-17 DIAGNOSIS — H269 Unspecified cataract: Secondary | ICD-10-CM

## 2012-03-17 DIAGNOSIS — H524 Presbyopia: Principal | ICD-10-CM

## 2012-03-17 NOTE — Nursing Note (Signed)
>>   Dzenana Idrizovic, OD     Thu Mar 18, 2012 10:09 AM  Pt here for full exam and update on contacts today.     She was last examined 2 yrs ago.     Cc; blurry vision with her current glasses OU; Pt noticed her vision getting worse in the last few months.     >> Karoline Caldwell     Wed Mar 17, 2012  5:41 PM  Pt here to update glasses rx

## 2012-03-17 NOTE — Nursing Note (Signed)
>>   Brianna [name]     Wed Mar 17, 2012  5:39 PM  Pt is here to update monovision CL Rx

## 2012-03-17 NOTE — Progress Notes (Signed)
Assessment:  1.Compound myopic astigmatism with the rule  2. Anterior and Posterior segment within normal limits, NS cataract OU    Plan:  1.RX   OD: -6.50-1.00x180   OS: -8.75-0.50x180  2. Monitor NS OU yearly. RTC in one year for CEE.

## 2012-03-17 NOTE — Progress Notes (Signed)
Trial Contacts    Brand:Acuvue Oasys  BC:8.4  Diameter:14.0    Rx:  OD:-2.75 sph  OS:-6.50 sph    VA ccl:  D  OU: 20/30+2  N  OU: 20/50    OR:  OD:  -0.75                  VA at distance: 20/30  OS:   plano                 VA at near: 20/40      Fit:  OD: positioned superior temporally, good comfort, full coverage at all positions of gaze  OS: positioned superior temporally, good comfort, full coverage at all positions of gaze

## 2012-03-18 NOTE — Progress Notes (Signed)
In addition to bellow:    Pt's best corrected vision is 20/40 +2 OD and 20/40 +1 and 20/30 -2 OU.     Note: It does not seem that her cataract is 20/40 cataract OD, OS.   There is a distrorted K reflex upon retinoscopy, thus, I am questioning K irregularity.  Pt will rtc to clinic next available for topography and then f/u with me.   At this time, no rx given today, because it is significantly different then her current lenses, but it only improves patient's vision a little bit.   We will postpone this until topography is done; and if it is regular, then will give new rx, and perhaps consider cataract surgery.   Pt ed about this.

## 2012-03-18 NOTE — Progress Notes (Signed)
Will charge the pt $70 if we end up prescribing this rx, if not, if we ended up doing specialty contacts, then we will charge her for the difference.

## 2012-03-18 NOTE — Progress Notes (Signed)
Will postpone giving pt rx for lenses, after her topography is done.

## 2012-03-29 ENCOUNTER — Encounter (HOSPITAL_BASED_OUTPATIENT_CLINIC_OR_DEPARTMENT_OTHER): Payer: Self-pay

## 2012-03-31 ENCOUNTER — Ambulatory Visit (HOSPITAL_BASED_OUTPATIENT_CLINIC_OR_DEPARTMENT_OTHER): Payer: MEDICARE

## 2012-04-07 ENCOUNTER — Ambulatory Visit (HOSPITAL_BASED_OUTPATIENT_CLINIC_OR_DEPARTMENT_OTHER): Payer: PRIVATE HEALTH INSURANCE | Admitting: Optometry

## 2012-04-07 ENCOUNTER — Ambulatory Visit (HOSPITAL_BASED_OUTPATIENT_CLINIC_OR_DEPARTMENT_OTHER): Payer: MEDICARE

## 2012-04-07 DIAGNOSIS — Q134 Other congenital corneal malformations: Principal | ICD-10-CM

## 2012-04-07 NOTE — Nursing Note (Signed)
>>   Brianna [name]     Wed Apr 07, 2012  5:37 PM  Pt's here for a CL f/u. Last exam pt was fit with SCL but was not entirely happy with her vision. Rx w/ held until topography was done. Pt is a possible candidate for Rose K lens. Pt here today to try the lenses.     Last exam 03/18/12:   Rx for CL:   Trial:   Brand:Acuvue Oasys  BC:8.4  Diameter:14.0    Rx:  OD:-2.75 sph  OS:-6.50 sph    VA ZOX:[id number] OU: 20/30+2 Near OU: 20/50    OR:  OD:  -0.75                  VA at distance: 20/30  OS:   plano                 VA at near: 20/40    No changes to Ocular or Medical Hx since last Visit.

## 2012-04-09 NOTE — Progress Notes (Signed)
We will order RGPs for the patient and call her when they arrive.     Note: pt was charged 220 for the exam as opposed to 320 this year since she was quoted that.

## 2012-04-14 NOTE — Progress Notes (Signed)
I ordered the lenses and pt will rtc for f/u when those arrive.

## 2012-04-24 ENCOUNTER — Ambulatory Visit (HOSPITAL_BASED_OUTPATIENT_CLINIC_OR_DEPARTMENT_OTHER): Payer: PRIVATE HEALTH INSURANCE | Admitting: Optometry

## 2012-04-24 DIAGNOSIS — H18609 Keratoconus, unspecified, unspecified eye: Principal | ICD-10-CM

## 2012-04-24 NOTE — Nursing Note (Signed)
>>   Brianna [name]     Sat Apr 24, 2012  3:16 PM  Has kerataconus, here for rose K2 fitting with lenses that arrived.     Also, she believes that her lenses were to be monovision, but that is not how they were sent from [facility]; they were sent as the same power for each eye.

## 2012-04-24 NOTE — Progress Notes (Signed)
I had a pleasure of seeing 56 year old Brianna [name] at the [facility] on 04/24/2012.   The summary of findings of the follow up  eye exam are following:    Assessment:     1. Adequate vision, and comfort. Fit of OD seemed slightly loose.   2. I &R was given on these RGP lenses.     Plan:    1. Reorder new lenses based upon the presentation of today's fit. Call patient when new lenses are in for fit assessment.

## 2012-05-05 ENCOUNTER — Ambulatory Visit (HOSPITAL_BASED_OUTPATIENT_CLINIC_OR_DEPARTMENT_OTHER): Payer: PRIVATE HEALTH INSURANCE | Admitting: Optometry

## 2012-05-05 DIAGNOSIS — H18609 Keratoconus, unspecified, unspecified eye: Principal | ICD-10-CM

## 2012-05-09 ENCOUNTER — Encounter (HOSPITAL_BASED_OUTPATIENT_CLINIC_OR_DEPARTMENT_OTHER): Payer: Self-pay | Admitting: Optometry

## 2012-05-09 ENCOUNTER — Ambulatory Visit (HOSPITAL_BASED_OUTPATIENT_CLINIC_OR_DEPARTMENT_OTHER): Payer: PRIVATE HEALTH INSURANCE | Admitting: Optometry

## 2012-05-09 DIAGNOSIS — H1870 Unspecified corneal deformity: Principal | ICD-10-CM

## 2012-05-09 NOTE — Progress Notes (Signed)
I will call Brianna [name] labs and see what they suggest regarding trying different sets or adjusting the current ones.

## 2012-05-09 NOTE — Progress Notes (Signed)
I had a pleasure of seeing 56 year old Brianna [name] at the [facility] center on 05/09/2012.   The summary of findings of the follow up CL eye exam are following:    Assessment:     1. Poor comfort,vision, decent fit of RGP lenses.   Plan:    1. We can reorder a new fit of these lenses, or patient may return to SCL if that is her greater preference at this point.

## 2012-05-09 NOTE — Nursing Note (Signed)
>>   Brianna [name] May 09, 2012  2:26 PM  Is not happy with the RGPs.   Feels vision is off, not enough improvement over SCL.  Feels that the lenses are uncomfortable.   Her preferance for monovision is the opposite of what we have now. She refers her left eye to be near, but her right eye is the near currently.   She does not like the care regimen involved with the RGPs, and potentially losing one would be much more expensive.   Feels like she sees a halo around images; perhaps due to cataracts.

## 2012-05-09 NOTE — Progress Notes (Signed)
BAT was conducted.    Patient was very indecisive, and felt vision was improving at light levels increased.    OD: 2/50  Low: 20/50  Med: 20/50  High: 20/50    OS: 20/50  Low: 20/40  Med: 20/40  High: 20/40    Therefore, her decreased visual acuity is not likely to be due to her mild cataracts.     I think she can probably see better than her VA, as she is very hesitant to read further down the chart.

## 2012-07-28 ENCOUNTER — Ambulatory Visit (HOSPITAL_BASED_OUTPATIENT_CLINIC_OR_DEPARTMENT_OTHER): Payer: PRIVATE HEALTH INSURANCE | Admitting: Optometrist

## 2012-07-28 DIAGNOSIS — H52209 Unspecified astigmatism, unspecified eye: Principal | ICD-10-CM

## 2012-07-28 DIAGNOSIS — H5210 Myopia, unspecified eye: Secondary | ICD-10-CM

## 2012-07-28 DIAGNOSIS — H524 Presbyopia: Principal | ICD-10-CM

## 2012-07-28 NOTE — Progress Notes (Signed)
I had a pleasure of seeing 56 year old Brianna [name] at the [facility] on 07/28/2012.   The summary of the contact Lens fitting are following:    Assessment and Plan:   Myopia  Astigmatism  Presbyopia    Good vision,  fitting and comfortwith AOA -5.00od and -8.50os monovision.      Patient understands that overnight use of contact lenses is discouraged.  Also, we discussed that patient should only use quality contact lens cleaners and disinfectants, and never tap water or saline solution to store or clean the contact lenses.  We have discussed that if the eyes are red or irritated, or if the vision is blurry, then patient should remove the contact lenses.    If the redness or pain persists after contact lens removal, then patient should follow up with me or to ER.    Prescription given to patient,  Patient will fill the order .    RTC in one year for contact lens follow up.

## 2012-07-28 NOTE — Nursing Note (Signed)
>>   Brianna [name], OD     Wed Jul 28, 2012  3:05 PM  Here for try the soft lens. No complaints. Don't want to try RGP anymore.

## 2012-07-29 ENCOUNTER — Encounter (HOSPITAL_BASED_OUTPATIENT_CLINIC_OR_DEPARTMENT_OTHER): Payer: Self-pay

## 2013-01-06 ENCOUNTER — Encounter (HOSPITAL_BASED_OUTPATIENT_CLINIC_OR_DEPARTMENT_OTHER): Payer: Self-pay | Admitting: Ophthalmology

## 2013-01-06 ENCOUNTER — Ambulatory Visit (HOSPITAL_BASED_OUTPATIENT_CLINIC_OR_DEPARTMENT_OTHER): Payer: MEDICARE | Admitting: Ophthalmology

## 2013-01-06 ENCOUNTER — Encounter (HOSPITAL_BASED_OUTPATIENT_CLINIC_OR_DEPARTMENT_OTHER): Payer: Self-pay | Admitting: Internal Medicine

## 2013-01-06 DIAGNOSIS — H1130 Conjunctival hemorrhage, unspecified eye: Secondary | ICD-10-CM

## 2013-01-06 NOTE — Progress Notes (Signed)
.  Brianna [name] was seen at the [facility] c.o. A red eye OD which she woke up with.    On exam she has a fairly extensive subconjunctival hemorrhage OD.    There were no other acute ocular issues.    She denies any easy bruising or bleeding.    She has poor vision with her glasses.    She will rtc for a corneal topography out of her contact lenses x 48 hour.

## 2013-01-06 NOTE — Nursing Note (Signed)
>>   Natasha Bence Shifrin,OT     Thu Jan 06, 2013  2:13 PM  Here on emergency. Red OD since this am. It hurts a little.  H/o Myopia, Astigmatism, Presbyopia.  Wears Acuvue monovision CLs.

## 2013-01-21 ENCOUNTER — Ambulatory Visit (HOSPITAL_BASED_OUTPATIENT_CLINIC_OR_DEPARTMENT_OTHER): Payer: MEDICARE

## 2013-01-21 DIAGNOSIS — H52209 Unspecified astigmatism, unspecified eye: Principal | ICD-10-CM

## 2013-01-21 NOTE — Nursing Note (Signed)
>>   Brianna [name],OT     Fri Jan 21, 2013  3:53 PM  HERE FOR CORNEAL TOPOGRAPHY

## 2013-01-23 ENCOUNTER — Ambulatory Visit (HOSPITAL_BASED_OUTPATIENT_CLINIC_OR_DEPARTMENT_OTHER): Payer: PRIVATE HEALTH INSURANCE | Admitting: Optometrist

## 2013-01-26 ENCOUNTER — Ambulatory Visit (HOSPITAL_BASED_OUTPATIENT_CLINIC_OR_DEPARTMENT_OTHER): Payer: PRIVATE HEALTH INSURANCE | Admitting: Optometrist

## 2013-01-26 DIAGNOSIS — H5213 Myopia, bilateral: Secondary | ICD-10-CM

## 2013-01-26 NOTE — Progress Notes (Signed)
I had a pleasure of seeing 57 year old Brianna [name] at the [facility] on 01/26/2013.   The summary of the contact Lens fitting are following:    Assessment and Plan:  High Myopia    Good vision,  fitting and comfort with Acuvue Oasys.        Trials given above.   Patient understands that overnight use of contact lenses is discouraged.  Also, we discussed that patient should only use quality contact lens cleaners and disinfectants, and never tap water or saline solution to store or clean the contact lenses.  We have discussed that if the eyes are red or irritated, or if the vision is blurry, then patient should remove the contact lenses.    If the redness or pain persists after contact lens removal, then patient should follow up with me or to ER.    Patient education to wear these daily for 1-2 weeks and RTC for follow up wearing the contacts; VA and comfort check.   Note:  If patient loses/rips one or both lenses instructed to call for a replacement one.     Pt has cataract OU. The development of her cataract may be the cause of the increasing of the lens power and the decreased BCVA.

## 2013-01-26 NOTE — Nursing Note (Signed)
>>   Rockey Situ, OD     Wed Jan 26, 2013  4:15 PM  Here for cls update. C/o vision decrease for d with current cls. Topography shows regular cornea shape OU with peak K 47.0 OD and 46.7 OS.

## 2013-02-13 ENCOUNTER — Ambulatory Visit (HOSPITAL_BASED_OUTPATIENT_CLINIC_OR_DEPARTMENT_OTHER): Payer: PRIVATE HEALTH INSURANCE | Admitting: Optometrist

## 2013-02-13 DIAGNOSIS — H269 Unspecified cataract: Secondary | ICD-10-CM

## 2013-02-13 DIAGNOSIS — H5213 Myopia, bilateral: Principal | ICD-10-CM

## 2013-02-13 DIAGNOSIS — H524 Presbyopia: Secondary | ICD-10-CM

## 2013-02-13 NOTE — Progress Notes (Signed)
I had a pleasure of seeing 57 year old Brianna [name] at the [facility] on 02/13/2013.   The summary of the contact Lens fitting/update are following:    Assessment and Plan:  High Myopia with Presbyopia  Cataract OU    Good vision,  fitting and comfort with monovision setting Acuvue Oasys.    Patient understands that overnight use of contact lenses is discouraged.  Also, we discussed that patient should only use quality contact lens cleaners and disinfectants, and never tap water or saline solution to store or clean the contact lenses.  We have discussed that if the eyes are red or irritated, or if the vision is blurry, then patient should remove the contact lenses.    If the redness or pain persists after contact lens removal, then patient should follow up with me or to ER.    Prescription given to patient. RTC in a year for contact lens update.     -Pt prescription change toward more minus in recent years.   -Recent  Topography shows K: 45.8/46.4@65 .5; 45.9/46.4@81 .7;  with no sign of keratoconus.   Prescription increase may caused by the development of her cataract.     RTC in a month to check by Ophthalmologist for possible further treatment of her vision problem.

## 2013-02-13 NOTE — Nursing Note (Signed)
>>   Rockey Situ, OD     Sun Feb 13, 2013  2:15 PM  Here for contact lens f/u. Feels comfortable with current monovision contact lens. No complaints.

## 2013-03-02 ENCOUNTER — Ambulatory Visit (HOSPITAL_BASED_OUTPATIENT_CLINIC_OR_DEPARTMENT_OTHER): Payer: MEDICARE | Admitting: Ophthalmology

## 2013-03-23 ENCOUNTER — Ambulatory Visit (HOSPITAL_BASED_OUTPATIENT_CLINIC_OR_DEPARTMENT_OTHER): Payer: Self-pay | Admitting: Internal Medicine

## 2013-03-23 LAB — MA SCREENING MAMMO BILATERAL WITH CAD

## 2013-03-23 NOTE — Progress Notes (Signed)
Quick Note:    Noted. Letter sent.    Babatunde Seago, PA-C  ______

## 2013-03-24 ENCOUNTER — Ambulatory Visit (HOSPITAL_BASED_OUTPATIENT_CLINIC_OR_DEPARTMENT_OTHER): Payer: MEDICARE | Admitting: Ophthalmology

## 2013-03-24 DIAGNOSIS — H5213 Myopia, bilateral: Secondary | ICD-10-CM

## 2013-03-24 DIAGNOSIS — IMO0002 Reserved for concepts with insufficient information to code with codable children: Principal | ICD-10-CM

## 2013-03-24 NOTE — Progress Notes (Signed)
Brianna [name] was seen for follow up at the [facility].    She has high myopia.    She has mild nuclear sclerotic cataracts with a very early posterior subcapsular cataract forming OD.    She did not have any corneal degeneration.    She will rtc PRN/1 year.

## 2013-03-24 NOTE — Nursing Note (Signed)
>>   Lanora Manis Silva,OT     Thu Mar 24, 2013  8:19 AM  Pt here for fu per Dr. Sherrilee Gilles for evaluation cataracts and possible thinning of the corneas.      High myopia, both eyes         Cataract           Presbyopia OU

## 2013-04-13 ENCOUNTER — Ambulatory Visit (HOSPITAL_BASED_OUTPATIENT_CLINIC_OR_DEPARTMENT_OTHER): Payer: MEDICARE | Admitting: Internal Medicine

## 2013-04-29 ENCOUNTER — Ambulatory Visit (HOSPITAL_BASED_OUTPATIENT_CLINIC_OR_DEPARTMENT_OTHER): Payer: MEDICARE | Admitting: Internal Medicine

## 2013-04-29 ENCOUNTER — Encounter (HOSPITAL_BASED_OUTPATIENT_CLINIC_OR_DEPARTMENT_OTHER): Payer: Self-pay | Admitting: Internal Medicine

## 2013-04-29 VITALS — BP 100/70 | Ht 63.0 in | Wt 112.0 lb

## 2013-04-29 DIAGNOSIS — Z23 Encounter for immunization: Secondary | ICD-10-CM

## 2013-04-29 DIAGNOSIS — Z0000 Encounter for general adult medical examination without abnormal findings: Principal | ICD-10-CM

## 2013-04-29 DIAGNOSIS — IMO0002 Reserved for concepts with insufficient information to code with codable children: Secondary | ICD-10-CM

## 2013-04-29 DIAGNOSIS — R140 Abdominal distension (gaseous): Secondary | ICD-10-CM

## 2013-04-29 LAB — HOLD PURPLE TOP TUBE

## 2013-04-29 LAB — LIPID PANEL
Cholesterol: 185 mg/dL (ref 0–239)
HIGH DENSITY LIPOPROTEIN: 60 mg/dL (ref 40–60)
LOW DENSITY LIPOPROTEIN DIRECT: 116 mg/dL (ref 0–189)
TRIGLYCERIDES: 106 mg/dL (ref 0–150)

## 2013-04-29 MED ORDER — ESTROGENS, CONJUGATED 0.625 MG/GM VA CREA
1.5000 g | TOPICAL_CREAM | Freq: Every evening | VAGINAL | Status: DC
Start: 2013-04-29 — End: 2018-07-06

## 2013-04-29 NOTE — Progress Notes (Signed)
Costochondritis:            Brianna [name] is a 57 year old female who comes for the Subsequent Annual wellness visit, including Personalized Prevention Plan (PPPE) (it has been at least 12 months since the annual wellness visit).[name]     HISTORY (Update these sections in their respective activities)  Review of Patient's Allergies indicates:   Penicillins             Hives    Comment:childhood.    Past Medical History    Major depressive disorder, single episode, moderate     Disorder of bone and cartilage, unspecified BMD 8/04    Comment: Osteopenia only, femoral neck.    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 MAH: EGD, Cscopy 09/09/06: all normal. 2/08: rslvd.    Thyroid disease     Myopia     Astigmatism     Presbyopia          Past Surgical History    EXCISION TONSIL TAGS      IMPLANT MESH OPN HERNIA RPR/DEBRIDEMENT CLOSURE      Comment inguinal hernia       Family History    Mental/Emotional Disorders Mother     Mental/Emotional Disorders Father     Mental/Emotional Disorders Sister     Cancer - Breast Mother     Comment: dx'd at 46. D. age 30.       Social History   Marital Status: Single  Spouse Name: N/A    Years of Education: N/A  Number of Children: 0     Occupational History  disability      NONE      Social History Main Topics   Smoking status: Never Smoker     Smokeless tobacco: Not on file    Alcohol Use: No    Drug Use: No    Sexually Active: Not Currently  Partner(s): Female    Comment: not since '05.     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  [place] in [place].  Lives alone.  Single since '05.  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       Current Outpatient Prescriptions:  estrogen, conjugated, (PREMARIN) 0.625 MG/GM vaginal cream Place 1.5 g vaginally nightly. For 3 weeks; then use as needed (once  or twice a week). Disp: 45 g Rfl: 1   Multiple Vitamin (MULTIVITAMINS PO)  Disp:  Rfl:      No current facility-administered medications for this visit.    PROVIDERS AND SUPPLIERS (See demographics, encounters tab, filter by provider)  Isla Pence, MD    [facility] - [place], [place] - [address]  Phone: [phone] Fax: [phone]    Specialists:  Psychiatry    SCREENINGS  Cognitive impairment is not detected byobservation, patient report and family, friends or caretaker    Depression screen -    In last 2 weeks has felt down, depressed or hopeless? No  Little interest or pleasure in doing things No.   Last PHQ9 total score (Recorded from the Screening Questions section):    PHQ-9 TOTAL SCORE 02/26/2012   Questionnaire Total Score -   Doc FlowSheet Total Score 8       Hearing loss screen -  Trouble hearing the television or  radio when others can hear it? No  Straining or struggling to hear/understand conversations? No    Function screen -    Needs help with preparing meals, transportation, finances or other ADLs? No  Lives alone? Yes.    Home safety screen -    Doeshome have throw rugs, poor lighting or slippery bathtub or shower? No  Does home LACK grab bars in bathrooms or handrails on stairs and steps? No  Does home LACK functioning smokealarms? No    Falls risk screen -   (Recorded from the Screening Questions section)        Vision screen -    Vision good, n/a d/t age.    Hepatitis B riskassessment -    Because of lack of identifiable risk factors, her risk for hepatitis B is low (1) (moderate or high: consider Hep B vaccination)      Screen for overweight and hypertension -    BP 100/70  Ht 5\' 3"  (1.6 m)  Wt 112 lb (50.803 kg)  BMI 19.84 kg/m2  Body mass index is 19.84 kg/(m^2).    One-time screening for abdominal aortic aneurysm (AAA) by ultrasonography in men aged 57 to 70 who have ever smoked -  Is not indicated in this female who reports that she has never smoked. She does not have any  smokeless tobacco history on file.    Any positive or abnormal responses should trigger further evaluation or referral    ADVANCED CARE PLANNING  The patient has identified a Healthcare Proxy (recorded in demographics section)    ACUTE ISSUES  No acute issues discussed today    HEALTH MAINTENANCE  See Health Maintenance Report for details of screening done.    Health Maintenance Items Overdue and Due Soon  Health Care Proxy due on 07[birth year]03/1972  Tetanus (16 And Over) due on 05/11/2012  Lipid Screening due on 11/25/2012  Physical Exam (Age 29+) due on 02/25/2013  Flu Vaccine Seasonal due on 04/11/2013    All Health Maintenance Items and DueDates -  Health Care Proxy due on 07/03/1972  Tetanus (16 And Over) due on 05/11/2012  Lipid Screening due on 11/25/2012  Physical Exam (Age 29+) due on 02/25/2013  Flu Vaccine Seasonal due on 04/11/2013  Mammography due on 03/24/2015  Pap Smear due on 12/05/2015  Hpv Screening due on 12/05/2015  Colonoscopy due on 09/09/2016  Hiv Screening Completed  Hep B High Risk Vaccine Eval (Once) Completed    IDENTIFIED PROBLEMS AND RISKS  Medicare annual wellness visit, subsequent  (primary encounter diagnosis)  Screening for endorine/nutritional/metabolic disease  Need for prophylactic vaccination with combined diphtheria-tetanus-pertussis (DTP) vaccine  Needs flu shot  Flatulence/gas pain/belching    ORDERS,PRESCRIPTIONS AND REFERRALS PLACED IN THIS ENCOUNTER    Orders Placed This Encounter  ROUTINE VENIPUNCTURE  TDAP VACCINE 7 AND OLDER IM (PUBLIC)  Influenza Virus Quad Presv Free Vaccine 3/> YRS IM (PUBLIC)  Hold Purple Top Tube  Lipid Panel  estrogen, conjugated, (PREMARIN) 0.625 MG/GM vaginal cream   Sig: Place 1.5 g vaginally nightly. For 3 weeks; then use as needed (once or twice a week).   Dispense:  45 g   Refill:  1    PERSONALIZED INSTRUCTIONS  See Patient Instruction section.    Asked to use estrogen intravaginally for 3-4 weeks then we will attempt pap - was unable to do at last  pap exam d/t atrophic changes.

## 2013-05-17 ENCOUNTER — Telehealth (HOSPITAL_BASED_OUTPATIENT_CLINIC_OR_DEPARTMENT_OTHER): Payer: Self-pay | Admitting: Registered Nurse

## 2013-05-17 NOTE — Progress Notes (Signed)
Msg fwd to PCP for review and advice.

## 2013-05-17 NOTE — Progress Notes (Signed)
Pt called and Dr. Ria Bush instructions below relayed.  Pt verbalized understanding and agreed with plan.  PCP Notified.

## 2013-05-17 NOTE — Telephone Encounter (Signed)
Message copied by Kennith Center on Tue May 17, 2013 11:01 AM  ------       Message from: Fransisco Beau       Created: Tue May 17, 2013 10:07 AM       Regarding: Rx concerns       Contact: [phone]         Brianna [name] [id number], 57 year old, female              Patient's PCP: Isla Pence, MD       Patient's language of care: English              Person calling on behalf of patient: Patient (self) /               Calls today to speak to nurse. Pt would like to know if she needs to continue using Rx estrogen, conjugated, (PREMARIN) 0.625 MG prescribed on 04/29/13       Please advise.              CALL BACK NUMBER: [phone]        Caller does not need an interpreter.           ------

## 2013-05-17 NOTE — Progress Notes (Signed)
Advise to stop it 1 week before her 10/17 app't with me.  So: I recommend she use it just 1 more time, then stop.    Thanks!  [place]

## 2013-05-27 ENCOUNTER — Ambulatory Visit (HOSPITAL_BASED_OUTPATIENT_CLINIC_OR_DEPARTMENT_OTHER): Payer: MEDICARE | Admitting: Internal Medicine

## 2013-05-27 ENCOUNTER — Encounter (HOSPITAL_BASED_OUTPATIENT_CLINIC_OR_DEPARTMENT_OTHER): Payer: Self-pay | Admitting: Internal Medicine

## 2013-05-27 VITALS — BP 100/60 | HR 66 | Temp 96.4°F | Wt 112.0 lb

## 2013-05-27 DIAGNOSIS — N952 Postmenopausal atrophic vaginitis: Principal | ICD-10-CM

## 2013-05-27 DIAGNOSIS — Z124 Encounter for screening for malignant neoplasm of cervix: Secondary | ICD-10-CM

## 2013-05-27 NOTE — Progress Notes (Signed)
Brianna [name] is a 57 year old patient  Here today re:   For pap; d/t atrophic vaginitis, this has been difficult in past.  She has been using topical estrogen and ready to try again.  Stopped estrogen about a week ago.  Denies gyn sx's, no side effects of hormonal medications, no vaginal bleeding and no discharge or pelvic pain  Vulvovaginal symptoms: none.     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  Previously in [place].  Lives alone.  Single since '05.  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         Smoking Status: Never Smoker                      Alcohol Use: No                 Current Outpatient Prescriptions:  estrogen, conjugated, (PREMARIN) 0.625 MG/GM vaginal cream Place 1.5 g vaginally nightly. For 3 weeks; then use as needed (once or twice a week). Disp: 45 g Rfl: 1   Multiple Vitamin (MULTIVITAMINS PO)  Disp:  Rfl:      No current facility-administered medications for this visit.  Reports no difficulties with meds, co-pays or insurance  Payor: MEDICARE / Plan: MEDICARE / Product Type: *No Product type* /   No difficulties with side-effects  Allergy History: Penicillins    Health maintenance: due for: Health Care Proxy due on 07/03/1972      PHYSICAL EXAMINATION:  BP 100/60  Pulse 66  Temp(Src) 96.4 F (35.8 C) (Temporal)  Wt 112 lb (50.803 kg)  BMI 19.84 kg/m2  SpO2 97%   Comfortable-appearing; alert, ambulatory, attentive.  Pelvic:  No inguinal lymphadenopathy.  No lesions observed on mons, inner thighs, perineum. Labia majora and minora  normal. Urethral meatus normal.   Vagina normal, atrophic, there is residual Premarin in vault.   Cervix normal, without lesions.  Bimanual exam:  Uterus normal in size and shape without tenderness.  Adnexae without any appreciable masses, no tenderness on palpation.    ASSESSMENT/PLAN:   1. Atrophic vaginitis / 2. Screening for cervical cancer  - PAP SMEAR THIN PREP  - Human Papillomavirus (HPV)      1. The patient indicates understanding of these issues and agrees with the plan.  2. Past medical history, medications, problem list, allergies, family history, health habits, social history: reviewed, updated, as noted in EPIC.   3. I reconciled the patient's medication list and prepared and supplied needed refills.    Electronically signed by: Loann Quill MD, 05/27/2013 3:05 PM

## 2013-05-31 LAB — HUMAN PAPILLOMAVIRUS (HPV): HUMAN PAPILLOMAVIRUS: NEGATIVE

## 2013-06-06 LAB — CYTOPATH, C/V, THIN LAYER

## 2013-07-21 ENCOUNTER — Ambulatory Visit (HOSPITAL_BASED_OUTPATIENT_CLINIC_OR_DEPARTMENT_OTHER): Payer: MEDICARE | Admitting: Ophthalmology

## 2013-07-21 ENCOUNTER — Ambulatory Visit (HOSPITAL_BASED_OUTPATIENT_CLINIC_OR_DEPARTMENT_OTHER): Payer: MEDICARE

## 2013-07-21 ENCOUNTER — Encounter (HOSPITAL_BASED_OUTPATIENT_CLINIC_OR_DEPARTMENT_OTHER): Payer: Self-pay | Admitting: Ophthalmology

## 2013-07-21 DIAGNOSIS — H269 Unspecified cataract: Secondary | ICD-10-CM

## 2013-07-21 DIAGNOSIS — IMO0002 Reserved for concepts with insufficient information to code with codable children: Principal | ICD-10-CM

## 2013-07-21 DIAGNOSIS — H5213 Myopia, bilateral: Secondary | ICD-10-CM

## 2013-07-21 NOTE — Progress Notes (Signed)
Brianna [name] was seen for follow up at the [facility].    Brianna [name] has an early posterior subcapsular cataract OD.    Brianna [name] is considering cataract surgery.    Lens options including Toric Lenses, multifocal, and acomodating lens implants.    Brianna [name] is considering cataract surgery OD.

## 2013-07-21 NOTE — Nursing Note (Signed)
>>   Brianna [name],OT     Thu Jul 21, 2013  9:29 AM  F/u Cataract ou, High Myopia, Presbyopia.  Vision is blurry. Wants Cataract Sx.

## 2013-10-10 ENCOUNTER — Encounter (HOSPITAL_BASED_OUTPATIENT_CLINIC_OR_DEPARTMENT_OTHER): Payer: Self-pay | Admitting: Internal Medicine

## 2013-10-10 ENCOUNTER — Ambulatory Visit (HOSPITAL_BASED_OUTPATIENT_CLINIC_OR_DEPARTMENT_OTHER): Payer: MEDICARE | Admitting: Internal Medicine

## 2013-10-10 VITALS — BP 126/80 | HR 59 | Wt 121.0 lb

## 2013-10-10 DIAGNOSIS — R1110 Vomiting, unspecified: Secondary | ICD-10-CM

## 2013-10-10 DIAGNOSIS — R5383 Other fatigue: Secondary | ICD-10-CM

## 2013-10-10 DIAGNOSIS — N760 Acute vaginitis: Secondary | ICD-10-CM

## 2013-10-10 MED ORDER — CLINDAMYCIN PHOSPHATE 2 % VA CREA
1.0000 | TOPICAL_CREAM | Freq: Every evening | VAGINAL | Status: DC
Start: 2013-10-10 — End: 2013-11-21

## 2013-10-10 NOTE — Progress Notes (Signed)
Brianna [name] is a 58 year old patient  Here today re:   1. Vomited x 3 btwn Dec. and mid-Feb., each time post drinking an Ayurvedic anti-oxidant drink which she'd make up and keep unrefrig'd; she did notice a film on top of it the last time, and wondered if this might be cause of the GI reaction.  Emesis was just fluid, not food.  She denies any GI sx's between these episodes, nor was there prominent nausea preceding the emesis.  No dysphagia. No loss of appetite, jaundice, RUQ pain. No diarrhea. No involuntary weight loss.    2. Has had an ongoing vaginal discharge, for ~ 3-4 months.  Recalls onset post the pap test done 05/27/13; in preparation for pap test she used vaginal estrogen regularly for some weeks.  Denies pruritis or pain; she is not sexually active.   3. Wonders about sleep apnea - b/c of fatigue, and notes, it has happened that " I caught myself beginning to snore."  No daytime hypersomnolence. Had not seen cpap machine/ setup, ?what it is.  No a.m. HA, no HTN.  4.  [facility] tests?  How can she get copies of?  Done 2013.  She had EEGs, a brain MRI.  Also NP testing - she got result of this.  5.  Wonders about a test she heard about, in which person's hair is tested for level of cortisol.  Has Q's about this b/c is stressed a lot and has had significant stressors in past, knows cortisol is a stress hormone, wonders if hers wd come back elevated.     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  Previously in [place].  Lives alone.  Single since '05.  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         Smoking Status: Never Smoker                      Alcohol Use: No                 Current Outpatient Prescriptions:  Multiple Vitamin (MULTIVITAMINS PO)  Disp:  Rfl:      No current facility-administered medications for this visit.  Reports no difficulties with meds, co-pays or insurance  Payor: MEDICARE / Plan: MEDICARE / Product Type: *No Product type* /   No difficulties with side-effects  Allergy History: Penicillins    Health maintenance: due for: Health Care Proxy due on 07/03/1972      PHYSICAL EXAMINATION:  BP 126/80  Pulse 59  Wt 121 lb (54.885 kg)  BMI 21.44 kg/m2  SpO2 99%   Comfortable-appearing; alert, ambulatory, attentive.  Engaged and interactive, speaks clearly and easily with normal modulation and volume, makes good eye contact, interacts appropriately.  Moves without difficulty, gives details of history without hesitation.   Anicteric.  Wt. +gain.  Abdomen nontender nondistended.  Pelvic:  No lesions observed on mons, inner thighs, perineum. Labia majora and minora and introitus: atrophic.  No lesions.   Vagina: mild erythema; atrophic; abundant thick homogenous yellow discharge at end of vaginal vault, nonadherent.  Cervix normal, without lesions.    Wet prep: thick light-yellow discharge, whiff negative.  pH 5.5.  Microscopy: masses of PMNs; normal epithelial cells and parabasal epithelial cells; decrease in normal lactobacilli.  No candidal spores/hyphae, no trich., no clue cells.    Most Recent Weight Reading(s)  10/10/13 : 121 lb (54.885 kg)  05/27/13 : 112 lb (50.803 kg)  04/29/13 : 112 lb (50.803 kg)  02/26/12 : 113 lb (51.256 kg)  01/23/12 : 115 lb (52.164 kg)      ASSESSMENT/PLAN:   1. 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  . The vaginal pH is elevated and  . Microscopy reveals leukorrhea (WBCs) and parabasal  epithelial cells.  . The  whiff test is negative.  [name] DIV is clinically indistinguishable from atrophic  vaginitis with resulting bacterial infection, except  that it fails to respond to topical estrogen therapy.  TX: Topical clindamycin (2% vaginal cream, one applicator per vagina before bedtime for 14 - 28 days)  Source: http://mphi-web.ungerboeck.com/documents/boeshsv&vaginitis.pdf    - will try the tx and have her f/u. Topical clinda often not covered - ?may need PA.  Sent to [facility].    Called to go over all this with pt., but her phone number message does not have her name/voice on it - left nonspecific message to pick up script and to call.  RN can also go over above info with her.    2. Vomiting alone  I agree w her hypothesis - she will avoid unrefrig'd antioxidant drink for a while and just let me know if any vomiting happens in absence of the drink.  In that case, appropriate workup will be completed.    3. Fatigue  rvwd re: usually this is multifactorial.  Her pre-test probability OSA is low; she views cpap and feels that she wd not be able to sleep w this; opts to forgo sleep study for now.  Re: cortisol, discussed re: I think more complicated than this.  Tested levels wd not correlate w present or past stress.  Re: results from [facility], obtained and given to her w proviso that ALL brain MRIs show nonspecific white-matter changes.  She understands.    Counselling-oriented visit, 25 minutes spent face-to-face with patient, of which greater than 50% was direct counselling; topics covered included discussing diagnoses, reviewing typical symptoms and  approaches to workup, and options for management/treatment.     1. The patient indicates understanding of these issues and agrees with the plan.  2. Past medical history, medications, problem list, allergies, family history, health habits, social history: reviewed, updated, as noted in EPIC.   3. I reconciled the patient's medication list and prepared and supplied needed  refills.    Electronically signed by: Blima Singer MD, 10/10/2013 11:12 AM

## 2013-10-11 ENCOUNTER — Telehealth (HOSPITAL_BASED_OUTPATIENT_CLINIC_OR_DEPARTMENT_OTHER): Payer: Self-pay | Admitting: Registered Nurse

## 2013-10-11 NOTE — Telephone Encounter (Signed)
Message copied by Theotis Burrow on Tue Oct 11, 2013 10:53 AM  ------       Message from: Delora Fuel       Created: Tue Oct 11, 2013 10:37 AM       Regarding: call back regarding culture results       Contact: Coleta [id number], 58 year old, female, Telephone Information:       Home Phone      [phone]       Work Phone      Not on file.       Mobile          [phone]                     Patient's Preferred Pharmacy:               [facility] [place].       Phone: [phone] Fax: [phone]                     CONFIRMED TODAY: Christene Lye NUMBER: [phone]       Best time to call back:        Cell phone:        Other phone:              Available times:              Patient's language of care: English              Patient does not need an interpreter.              Patient's PCP: Tiburcio Bash, MD              Person calling on behalf of patient: Patient (self)              Calls today for test result(s) of her culture. You can reach the patient at [phone]                       ------

## 2013-10-11 NOTE — Progress Notes (Signed)
told her I needed to research her sx's and exam, and call her about what I think's going on. I think her vaginitis is "DIV", but now need to wait until she calls back to go over this w her. - documented in my note, some info about this. It is just a mild, annoying condition of postmenopausal women, nothing bad! I'll call her if you can get some good times when she will be there. nj

## 2013-10-11 NOTE — Progress Notes (Signed)
Pt called, msg left to call the clinic back at [phone].  PCP Notified.

## 2013-10-12 NOTE — Progress Notes (Addendum)
Spoke with patient. Message reviewed with patient from Dr. Ivin Booty. Patient stated she will be available tomorrow morning for call back as long as this is convenient for Dr.Janeway.  Can be reached at either home phone [phone] or cell phone [phone]. Provider notified.

## 2013-10-23 ENCOUNTER — Telehealth (HOSPITAL_BASED_OUTPATIENT_CLINIC_OR_DEPARTMENT_OTHER): Payer: Self-pay | Admitting: Internal Medicine

## 2013-10-23 NOTE — Progress Notes (Signed)
Letter written 10/17/13 rcvd:  1. Has been using clinda intra-vag cream, 1 applicatorful left.  Wonders what caused the condition. Does this has to do w hygiene/cleanliness?  OK to leave message on [phone].  2. Hoping to have cataract surg in late April- will be coming in for pre-op physical.  3. Sx: thirsty a lot, urinary frequency & urgency: cd this be DM?  4. ?possible remedies for Chronic Fatigue - read about fatigue exacerbated by exertion; "made me think that might be part of what's going on for me."    Issues:  1. DM: low likelihood given not fam hx of, slender, no wt gain, and all past gluc checks low-nl (most recent, 5/13).  Can check again.  2. Tx's, CF: gradually increase exercise; CBT - these are interventions w some evidence efficacy.

## 2013-10-24 NOTE — Progress Notes (Signed)
10/24/2013: left message w info as detailed below, on her phone.

## 2013-11-21 ENCOUNTER — Encounter (HOSPITAL_BASED_OUTPATIENT_CLINIC_OR_DEPARTMENT_OTHER): Payer: Self-pay | Admitting: Internal Medicine

## 2013-11-21 ENCOUNTER — Ambulatory Visit (HOSPITAL_BASED_OUTPATIENT_CLINIC_OR_DEPARTMENT_OTHER): Payer: MEDICARE | Admitting: Internal Medicine

## 2013-11-21 VITALS — BP 122/80 | HR 79 | Wt 118.0 lb

## 2013-11-21 DIAGNOSIS — IMO0002 Reserved for concepts with insufficient information to code with codable children: Secondary | ICD-10-CM

## 2013-11-21 DIAGNOSIS — Z01818 Encounter for other preprocedural examination: Secondary | ICD-10-CM

## 2013-11-21 DIAGNOSIS — F419 Anxiety disorder, unspecified: Secondary | ICD-10-CM

## 2013-11-21 LAB — HOLD RED TOP TUBE

## 2013-11-21 LAB — HEMATOCRIT: HEMATOCRIT: 40.8 % (ref 34.1–44.9)

## 2013-11-21 NOTE — Progress Notes (Signed)
Admission Note  [name] is a 58 year old female who is here for a preoperative physical.   I last saw her 10/23/2013  She has an upcoming cataract surgery.   Scheduled for: 11/30/13, w pre-op tomorrow.      Patient Active Problem List:     BONE & CARTILAGE DIS NOS     FATIGUE GENERAL     FAMILY HX-BREAST MALIG     Screening for unspecified condition     Major Depressive Disorder, Moderate     Bunion     Menopause     Vitamin D Deficiency     High myopia, both eyes         Past Surgical History    EXCISION TONSIL TAGS      IMPLANT MESH OPN HERNIA RPR/DEBRIDEMENT CLOSURE      Comment inguinal hernia       Family History    Cancer - Breast Mother     Comment: dx'd at 4. D. age 37.     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  Previously in [place].  Lives alone.  Single since '05.  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         Smoking Status: Never Smoker                      Alcohol Use: No                 Current Outpatient Prescriptions:  Multiple Vitamin (MULTIVITAMINS PO)  Disp:  Rfl:      No current facility-administered medications for this visit.  Allergy History: Penicillins      REVIEW OF SYSTEMS:  Skin: negative  Eyes: negative  Ears/Nose/Throat: negative  Respiratory: negative  Cardiovascular: negative  Gastrointestinal: negative  Genitourinary: negative  Musculoskeletal: negative  Neurologic: negative  Psychiatric:  anxiety  Hematologic/Lymphatic/Immunologic: negative  Endocrine: negative    PHYSICAL EXAMINATION:  BP 122/80  Pulse 79  Wt 118 lb (53.524 kg)  BMI 20.91 kg/m2  SpO2 97%   Comfortable-appearing; alert, ambulatory, attentive.   HEENT unremarkable  Neck supple  Chest clear to auscultation  Cor regular rate and rhythm, no murmur  Abdomen nontender nondistended   Extremities unremarkable, no edema  Skin unremarkable, no rash    EKG: NSR at 76, nl axis, nl intervals, no ST-T changes: nl  EKG.     Hct sent, result: pnd.    ASSESSMENT/PLAN:   Patient is clear for surgery.    11/21/2013 8:53 AM     _____________________________________  Tiburcio Bash, MD   Pager: [phone]      ================================================================    History and Physical Reviewed: (if admission is more than 24 hours after above H&P)  _____  No Change  _____  Changes as follows:        Date: _______________    Time: ______________      __________________________________       Pager: ______________  Provider Signature

## 2013-11-22 ENCOUNTER — Ambulatory Visit (HOSPITAL_BASED_OUTPATIENT_CLINIC_OR_DEPARTMENT_OTHER): Payer: MEDICARE | Admitting: Ophthalmology

## 2013-11-22 DIAGNOSIS — IMO0002 Reserved for concepts with insufficient information to code with codable children: Secondary | ICD-10-CM

## 2013-11-22 DIAGNOSIS — H5213 Myopia, bilateral: Secondary | ICD-10-CM

## 2013-11-22 NOTE — Nursing Note (Signed)
>>   Brianna [name],OT     Tue Nov 22, 2013 10:11 AM  Pt here for fu to discuss cataract surgery.

## 2013-11-22 NOTE — Progress Notes (Signed)
.  Brianna [name] was seen at the [facility] for an eye exam.    The patient has noticed decreasing vision in her right eye.    The patient was examined and found to a visually significant cataract in the right eye.    she  is having difficulty seeing for her activities of daily living.    The risk, benefits, and alternatives of cataract surgery have been discussed with the patient.    She would like to have cataract surgery on the right eye.  The patient will be returning for preoperative eye measurements and seeing her PCP for preoperative medical clearance.    Lens options including "premium," toric, and standard monovision intraocular lens options were discussed at length.    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

## 2013-11-24 LAB — EKG

## 2013-11-25 ENCOUNTER — Encounter (HOSPITAL_BASED_OUTPATIENT_CLINIC_OR_DEPARTMENT_OTHER): Payer: Self-pay | Admitting: Ophthalmology

## 2013-11-25 ENCOUNTER — Ambulatory Visit (HOSPITAL_BASED_OUTPATIENT_CLINIC_OR_DEPARTMENT_OTHER)
Admit: 2013-11-25 | Discharge: 2013-11-25 | Disposition: A | Discharge status: Home / Self Care | Payer: Self-pay | Source: Ambulatory Visit | Attending: Ophthalmology | Admitting: Ophthalmology

## 2013-11-25 NOTE — H&P (Signed)
Pre-Anesthetic Note    Patient:  Brianna [name] is a 57 year old female          Review of Patient's Allergies indicates:   Penicillins             Hives    Comment:childhood.    Diagnosis:   Right cataract  Procedure:   R/o cataract   Anesthetic Plan:   MAC with GA b/u     Previous Anesthetic History:       Past Surgical History    EXCISION TONSIL TAGS      IMPLANT MESH OPN HERNIA RPR/DEBRIDEMENT CLOSURE      Comment inguinal hernia     Patient denies complications of anesthesia.  Patient denies family complications of anesthesia.  Current Medications:      Current Outpatient Prescriptions on File Prior to Encounter:  Multiple Vitamin (MULTIVITAMINS PO)  Disp:  Rfl:      No current facility-administered medications on file prior to encounter.  Hospital Meds    PRN Meds        Social Hx:     Smoking status: Never Smoker     Smokeless tobacco: Not on file    Alcohol Use: No       Drug Use: No     PMHx:    Past Medical History    Major depressive disorder, single episode, moderate     Disorder of bone and cartilage, unspecified BMD 8/04    Comment: Osteopenia only, femoral neck.    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 MAH: EGD, Cscopy 09/09/06: all normal. 2/08: rslvd.    Thyroid disease     Myopia     Astigmatism     Presbyopia     Cataract      PE:  There were no vitals taken for this visit.   Vitals Range         Appearence:healthy, alert, well developed, well nourished, cooperative, smiling  Airway Classification:  Mallampati: Class II   The same as Class I except the tonsilar pillars are hidden by the tounge.   Mallampati Airway Classification:   Teeth:goodDentures:None  Neck:  FROM:   Yes  Lungs:  Clear: Yes  Tests:   Component Value Date   NA 142 01/01/2012   K 4.2 01/01/2012   CREAT 0.7 01/01/2012   GLUCOSER 83 01/01/2012   WBC 4.2 11/26/2007   HCT 40.8 11/21/2013   PLTA 242 11/26/2007      ASA Assessment: II  Emergency:  No   Potential Anesthesia Problems:  No  I  have informed the patient of the nature and purpose of the type of anesthesia, the reasonable alternative anesthesia methods, pertinent foreseeable risks involved and the possibility of complications. I have explained that an alternative form of anesthesia may be required by unexpected conditions arising before or during the procedure. Questions have been answered to the satisfaction of the patient who accepts the risk and agrees to proceed as planned.   [name]     Pager: [phone]

## 2013-11-25 NOTE — Discharge Instructions (Signed)
SURGICAL DAY CARE PRE-OPERATIVE INSTRUCTIONS    DAY OF SURGERY    Arrive at: [facility] Registration on Wednesday, April  22 at:12:00pm    You must have a responsible adult available to accompany you home after surgery. (We suggest that you have someone available to assist you at home after your surgery).    INSTRUCTIONS:      You may have nothing to eat or drink after midnight the night before your surgery, not even water, gum or hard candy.      You may not smoke the morning of your surgery.     Please leave all valuables at home, including jewelry, watches, money, etc.     Please remove all fingernail polish before arriving at Surgical Day Care and do not  wear any face or lip make-up.     Do not shave surgical site.     Do not wear contact lenses.    MEDICATIONS:     Take the following medication the night before surgery at bedtime: usual medicines    Take the following medication the morning of your surgery with only a sip of water: nothing

## 2013-11-30 ENCOUNTER — Ambulatory Visit (HOSPITAL_BASED_OUTPATIENT_CLINIC_OR_DEPARTMENT_OTHER)
Admit: 2013-11-30 | Disposition: A | Discharge status: Home / Self Care | Payer: Self-pay | Source: Ambulatory Visit | Attending: Ophthalmology | Admitting: Ophthalmology

## 2013-11-30 ENCOUNTER — Encounter (HOSPITAL_BASED_OUTPATIENT_CLINIC_OR_DEPARTMENT_OTHER): Payer: Self-pay | Admitting: Ophthalmology

## 2013-11-30 DIAGNOSIS — Z961 Presence of intraocular lens: Secondary | ICD-10-CM | POA: Insufficient documentation

## 2013-11-30 MED ORDER — TROPICAMIDE 1 % OP SOLN
1.0000 [drp] | OPHTHALMIC | Status: AC
Start: 2013-11-30 — End: 2013-11-30
  Administered 2013-11-30 (×3): 1 [drp] via OPHTHALMIC

## 2013-11-30 MED ORDER — FLURBIPROFEN SODIUM 0.03 % OP SOLN
1.0000 [drp] | OPHTHALMIC | Status: AC
Start: 2013-11-30 — End: 2013-11-30
  Administered 2013-11-30 (×3): 1 [drp] via OPHTHALMIC

## 2013-11-30 MED ORDER — LACTATED RINGERS IV SOLN
INTRAVENOUS | Status: DC
Start: 2013-11-30 — End: 2013-11-30
  Administered 2013-11-30: 13:00:00 via INTRAVENOUS

## 2013-11-30 MED ORDER — ERYTHROMYCIN 5 MG/GM OP OINT
0.50 [in_us] | TOPICAL_OINTMENT | Freq: Once | OPHTHALMIC | Status: AC
Start: 2013-11-30 — End: 2013-11-30
  Administered 2013-11-30: 0.5 [in_us] via OPHTHALMIC

## 2013-11-30 MED ORDER — ATROPINE SULFATE 1 % OP SOLN
OPHTHALMIC | Status: AC
Start: 2013-11-30 — End: 2013-11-30
  Administered 2013-11-30: 1 [drp] via TOPICAL
  Filled 2013-11-30: qty 5

## 2013-11-30 MED ORDER — PHENYLEPHRINE HCL 2.5 % OP SOLN
1.0000 [drp] | OPHTHALMIC | Status: AC
Start: 2013-11-30 — End: 2013-11-30
  Administered 2013-11-30 (×3): 1 [drp] via OPHTHALMIC

## 2013-11-30 MED ORDER — LIDOCAINE HCL (PF) 1 % IJ SOLN
INTRAMUSCULAR | Status: AC
Start: 2013-11-30 — End: 2013-11-30
  Administered 2013-11-30: 0.5 mL via INTRAOCULAR
  Filled 2013-11-30: qty 2

## 2013-11-30 MED ORDER — LIDOCAINE-EPINEPHRINE 2 %-1:200000 IJ SOLN
INTRAMUSCULAR | Status: DC
Start: 2013-11-30 — End: 2013-11-30
  Filled 2013-11-30: qty 20

## 2013-11-30 MED ORDER — DEXAMETHASONE SODIUM PHOSPHATE 4 MG/ML IJ SOLN
INTRAMUSCULAR | Status: AC
Start: 2013-11-30 — End: 2013-11-30
  Administered 2013-11-30: 2 mg via INTRAMUSCULAR
  Filled 2013-11-30: qty 1

## 2013-11-30 MED ORDER — ACETYLCHOLINE CHLORIDE 1:100 IO SOLR
INTRAOCULAR | Status: DC
Start: 2013-11-30 — End: 2013-11-30
  Filled 2013-11-30: qty 2

## 2013-11-30 MED ORDER — ACETAMINOPHEN 325 MG PO TABS
650.0000 mg | ORAL_TABLET | Freq: Four times a day (QID) | ORAL | Status: DC | PRN
Start: 2013-11-30 — End: 2013-11-30

## 2013-11-30 MED ORDER — BUPIVACAINE HCL (PF) 0.75 % IJ SOLN
INTRAMUSCULAR | Status: DC
Start: 2013-11-30 — End: 2013-11-30
  Filled 2013-11-30: qty 10

## 2013-11-30 MED ORDER — CYCLOPENTOLATE HCL 1 % OP SOLN
1.0000 [drp] | OPHTHALMIC | Status: AC
Start: 2013-11-30 — End: 2013-11-30
  Administered 2013-11-30 (×2): 1 [drp] via OPHTHALMIC

## 2013-11-30 MED ORDER — EPINEPHRINE HCL 1 MG/ML IJ SOLN
Freq: Once | INTRAMUSCULAR | Status: AC
Start: 2013-11-30 — End: 2013-11-30
  Administered 2013-11-30: 13:00:00 via OPHTHALMIC
  Filled 2013-11-30: qty 0.5

## 2013-11-30 MED ORDER — GENTAMICIN SULFATE 40 MG/ML IJ SOLN
INTRAMUSCULAR | Status: AC
Start: 2013-11-30 — End: 2013-11-30
  Administered 2013-11-30: 20 mg via INTRAMUSCULAR
  Filled 2013-11-30: qty 2

## 2013-11-30 MED ORDER — TETRACAINE HCL 0.5 % OP SOLN
OPHTHALMIC | Status: AC
Start: 2013-11-30 — End: 2013-11-30
  Administered 2013-11-30: 13:00:00 via TOPICAL
  Filled 2013-11-30: qty 2

## 2013-11-30 MED ORDER — CEFAZOLIN SODIUM 1 G IJ SOLR
INTRAMUSCULAR | Status: DC
Start: 2013-11-30 — End: 2013-11-30
  Filled 2013-11-30: qty 1000

## 2013-11-30 MED ORDER — BSS IO SOLN
INTRAOCULAR | Status: AC
Start: 2013-11-30 — End: 2013-11-30
  Administered 2013-11-30: 13:00:00
  Filled 2013-11-30: qty 30

## 2013-11-30 MED ORDER — MOXIFLOXACIN HCL 0.5 % OP SOLN
1.0000 [drp] | OPHTHALMIC | Status: AC
Start: 2013-11-30 — End: 2013-11-30
  Administered 2013-11-30 (×3): 1 [drp] via OPHTHALMIC

## 2013-11-30 NOTE — Brief Op Note (Signed)
Brief Procedure or Operative Note    Procedure: Cataract Surgery  right eye    Pre-Procedure Diagnosis: cataract   right eye    Post-Procedure Diagnosis: Same    Surgeon: Wenda Low. Joyleen Haselton, MD    Assistant:  None    Type of Anesthesia: Local     Findings: Cataract right eye    Estimated Blood Loss: minimal    Specimens Removed: none    Complications:  None    Other (e.g. Implants):  Other: Trulign Toric [id number] +11.00, 1.25 cylinder @75  degrees        Charise Carwin, 11/30/2013, 2:02 PM      Pager : [phone]                  .                [name]

## 2013-11-30 NOTE — Interval H&P Note (Signed)
ANESTHESIA INTERVAL NOTE   History and chart reviewed, patient examined prior to administration of anesthesia. Feels well. No change in clinical condition. NPO. Appears stable for right cataract removal today. VSS.  BP 114/66  Pulse 68  Temp(Src) 98.4 F (36.9 C) (Temporal)  Resp 20  Ht 5\' 3"  (1.6 m)  Wt 114 lb (51.71 kg)  BMI 20.2 kg/m2  SpO2 99%   Plan - MAC   Jetty Duhamel, 11/30/2013, 12:56 PM  Pager [phone]

## 2013-11-30 NOTE — H&P (View-Only) (Signed)
Pre-Anesthetic Note    Patient:  Brianna [name] is a 58 year old female          Review of Patient's Allergies indicates:   Penicillins             Hives    Comment:childhood.    Diagnosis:   Right cataract  Procedure:   R/o cataract   Anesthetic Plan:   MAC with GA b/u     Previous Anesthetic History:       Past Surgical History    EXCISION TONSIL TAGS      IMPLANT MESH OPN HERNIA RPR/DEBRIDEMENT CLOSURE      Comment inguinal hernia     Patient denies complications of anesthesia.  Patient denies family complications of anesthesia.  Current Medications:      Current Outpatient Prescriptions on File Prior to Encounter:  Multiple Vitamin (MULTIVITAMINS PO)  Disp:  Rfl:      No current facility-administered medications on file prior to encounter.  Hospital Meds    PRN Meds        Social Hx:     Smoking status: Never Smoker     Smokeless tobacco: Not on file    Alcohol Use: No       Drug Use: No     PMHx:    Past Medical History    Major depressive disorder, single episode, moderate     Disorder of bone and cartilage, unspecified BMD 8/04    Comment: Osteopenia only, femoral neck.    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 MAH: EGD, Cscopy 09/09/06: all normal. 2/08: rslvd.    Thyroid disease     Myopia     Astigmatism     Presbyopia     Cataract      PE:  There were no vitals taken for this visit.   Vitals Range         Appearence:healthy, alert, well developed, well nourished, cooperative, smiling  Airway Classification:  Mallampati: Class II   The same as Class I except the tonsilar pillars are hidden by the tounge.   Mallampati Airway Classification:   Teeth:goodDentures:None  Neck:  FROM:   Yes  Lungs:  Clear: Yes  Tests:   Component Value Date   NA 142 01/01/2012   K 4.2 01/01/2012   CREAT 0.7 01/01/2012   GLUCOSER 83 01/01/2012   WBC 4.2 11/26/2007   HCT 40.8 11/21/2013   PLTA 242 11/26/2007      ASA Assessment: II  Emergency:  No   Potential Anesthesia Problems:  No  I  have informed the patient of the nature and purpose of the type of anesthesia, the reasonable alternative anesthesia methods, pertinent foreseeable risks involved and the possibility of complications. I have explained that an alternative form of anesthesia may be required by unexpected conditions arising before or during the procedure. Questions have been answered to the satisfaction of the patient who accepts the risk and agrees to proceed as planned.   [place]     Pager: [phone]

## 2013-11-30 NOTE — Discharge Instructions (Signed)
You need to be seen at the [facility] tomorrow.  The office is located at the [facility], [address], [place].  The phone number is [phone] if you have any questions.     Start taking Vigamox and Pred forte one drop of each in the eye that had surgery today  4 time per day starting TODAY after DINNER.  Use both drops after lunch, dinner, before bed, and after breakfast.  Put the shield back over your eye between taking the drops and secure with a piece of tape    Call the office  [phone] if you have any problems of concerns.  The answering service will answer the call 24 hours per day.  Ask them to page Dr. Bonner Larue

## 2013-11-30 NOTE — Addendum Note (Signed)
Addended by: Charise Carwin on: 11/30/2013 12:45 PM     Modules accepted: Orders

## 2013-11-30 NOTE — Op Note (Signed)
Date of Operation: 11/30/2013    PREOPERATIVE DIAGNOSES:  Cataract, right eye; high myopia, and astigmatism.    POSTOPERATIVE DIAGNOSIS:  Cataract, right eye; high myopia, and astigmatism.     OPERATION:  Clear corneal cataract extraction with posterior chamber intraocular lens, model Trulign Toric BL1UT +11.00, cylinder 1.25 @ 75 degrees (_____ diopters) right eye.     SURGEON:  Wenda Low. Johnjoseph Rolfe MD.     ANESTHESIA:  Intracameral and topical     COMPLICATIONS:  None.     SPECIMENS:  None     ASSISTANT:                                                                             PROCEDURE:  The right eye was prepped and draped in the usual manner appropriate for intraocular surgery.  The operating microscope was used for good surgical control at all times.   A paracentesis was performed with a super blade.  Preservative free lidocaine was injected into the anterior chamber.  A viscoelastic material was then injected into the anterior chamber to deepen it.  A clear corneal temporal wound was created using a 2.8 mm keratome in a triplanar fashion approximately 1.5 mm from the limbus. A continuous curvilinear capsulorrhexis was performed using a bent-needle cystotome and capsulorrhexis forceps.  Phacoemulsification of the nucleus was performed using a quartering-and-cracking technique.  Automated irrigation/aspiration of the residual cortical material was then performed.  The intraocular lens was then injected into the capsular bag.  Automated irrigation/aspiration of residual viscoelastic material was then performed.  Balanced salt solution was used to inflate the anterior chamber.  The wound was tested and found to be watertight.  A Subtenon injection with 50 mg Kefzol and 2 mg dexamethasone were administered inferiorly, well away from the wound.  A drop of Vigamox was placed on the surface of the globe, as well of a ribbon of Ilotycin ointment.  A sterile shield was placed over the eye and secured with paper tape.      The patient was returned to the ambulatory day surgery unit in good condition.  The patient was instructed to start taking Vigamox and Prednisolone eye drops in the postoperative eye starting 4 hours after the surgery.     The patient was also instructed to follow up with Dr Lonia Chimera the following day and to call the office and have him paged if there are any concerns at any time.    ___________________________  Reviewed and Electronically Signed By: Benjaman Kindler M.D.  Sig Date: 12/19/2013  Sig Time: 17:26:38  Dictated By: Benjaman Kindler M.D.  Dict Date: 11/30/2013 Dict Time: 01 59 PM    Dictation Date and Time:11/30/2013 13:59:31  Transcription Date and Time:11/30/2013 14:28:31  eScription Dictation id: [id number] Confirmation # :[id number]

## 2013-12-01 ENCOUNTER — Ambulatory Visit (HOSPITAL_BASED_OUTPATIENT_CLINIC_OR_DEPARTMENT_OTHER): Payer: MEDICARE | Admitting: Ophthalmology

## 2013-12-01 DIAGNOSIS — IMO0002 Reserved for concepts with insufficient information to code with codable children: Principal | ICD-10-CM

## 2013-12-01 MED ORDER — PREDNISOLONE ACETATE 1 % OP SUSP
1.00 [drp] | Freq: Four times a day (QID) | OPHTHALMIC | Status: AC
Start: 2013-12-01 — End: 2014-01-01

## 2013-12-01 MED FILL — Moxifloxacin HCl Ophth Soln 0.5% (Base Equiv): OPHTHALMIC | Qty: 3 | Status: AC

## 2013-12-01 MED FILL — Prednisolone Acetate Ophth Susp 1%: OPHTHALMIC | Qty: 5 | Status: AC

## 2013-12-01 MED FILL — Flurbiprofen Sodium Ophth Soln 0.03%: OPHTHALMIC | Qty: 2.5 | Status: AC

## 2013-12-01 MED FILL — Phenylephrine HCl Ophth Soln 2.5%: OPHTHALMIC | Qty: 15 | Status: AC

## 2013-12-01 MED FILL — Tropicamide Ophth Soln 1%: OPHTHALMIC | Qty: 15 | Status: AC

## 2013-12-01 MED FILL — Cyclopentolate HCl Ophth Soln 2%: OPHTHALMIC | Qty: 2 | Status: AC

## 2013-12-01 MED FILL — Erythromycin Ophth Oint 5 MG/GM: OPHTHALMIC | Qty: 3.5 | Status: AC

## 2013-12-01 NOTE — Progress Notes (Signed)
Brianna [name] is day #1 s/p uncomplicated cataract surgery right eye. Sheis doing very well.  She should use Pred Forte 1% and Vigamox 1 gtt in the post operative eye QID.  The patient was instructed to cover the operated eye with the metal shield while sleeping for the first week and to return to clinic in  one week for follow up.  Patient also instructed to return to clinic immediately if there is decreased vision, ocular pain, or any other problems that develop.    She has high myopia OS and will need to consider cataract surgery or lasik to correct the anisometropia.

## 2013-12-01 NOTE — Nursing Note (Signed)
>>   Elizabeth Silva,OT     Thu Dec 01, 2013  9:06 AM  Pt here for 1 day po s/p phaco w/ PC IOL OD (Trulign Toric). No pain. FB sensation

## 2013-12-02 ENCOUNTER — Encounter: Payer: Self-pay | Admitting: Ophthalmology

## 2013-12-05 ENCOUNTER — Encounter (HOSPITAL_BASED_OUTPATIENT_CLINIC_OR_DEPARTMENT_OTHER): Payer: Self-pay | Admitting: Ophthalmology

## 2013-12-05 ENCOUNTER — Ambulatory Visit (HOSPITAL_BASED_OUTPATIENT_CLINIC_OR_DEPARTMENT_OTHER): Payer: MEDICARE | Admitting: Ophthalmology

## 2013-12-05 DIAGNOSIS — IMO0002 Reserved for concepts with insufficient information to code with codable children: Secondary | ICD-10-CM

## 2013-12-05 NOTE — Nursing Note (Signed)
>>   MATTHEW M COPPOLA     Mon Dec 05, 2013  31:63 AM  58 year old female, urgent visit today.    Pt comes in today 5 days s/p phaco pciol (toric)  Concerned that her right eye was still dilated, slightly blurry and  Some issue with glare.

## 2013-12-05 NOTE — Progress Notes (Signed)
Brianna [name] was seen for follow up s/p phaco with Trulign toric implant.    Brianna [name] has very good uncorrected vision.    There were no acute ocular issues.    Brianna [name] will d/c Vigamox.    Brianna [name] will continue to use pred forte 1 gtt OD qid.    Brianna [name] will rtc in 7-10 days.    Brianna [name] was still pharmacologically dilated from Atropine given at the completion of the surgery.

## 2013-12-08 ENCOUNTER — Ambulatory Visit (HOSPITAL_BASED_OUTPATIENT_CLINIC_OR_DEPARTMENT_OTHER): Payer: MEDICARE | Admitting: Ophthalmology

## 2013-12-14 ENCOUNTER — Ambulatory Visit (HOSPITAL_BASED_OUTPATIENT_CLINIC_OR_DEPARTMENT_OTHER): Payer: MEDICARE | Admitting: Ophthalmology

## 2013-12-14 ENCOUNTER — Encounter (HOSPITAL_BASED_OUTPATIENT_CLINIC_OR_DEPARTMENT_OTHER): Payer: Self-pay | Admitting: Ophthalmology

## 2013-12-14 DIAGNOSIS — IMO0002 Reserved for concepts with insufficient information to code with codable children: Secondary | ICD-10-CM

## 2013-12-14 NOTE — Progress Notes (Signed)
Brianna [name] was seen for follow up s/p cataract surgery OD.    Brianna [name] had a Trulign Toric Lens  implanted OD.    Brianna [name] is seeing very well at distance.    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    Brianna [name] can go ahead with cataract surgery OS to correct the imbalance.

## 2013-12-14 NOTE — Nursing Note (Signed)
>>   Brianna [name]     Wed Dec 14, 2013  8:51 AM  33 Female  Here for post op follow-up  S/p CE w/ PCIOL OD 11/30/2013    No Ocular complains reported OD  Vision improved OD per pt after surgery  No pain  No flashes or floaters OU

## 2013-12-30 ENCOUNTER — Encounter (HOSPITAL_BASED_OUTPATIENT_CLINIC_OR_DEPARTMENT_OTHER): Payer: Self-pay | Admitting: Internal Medicine

## 2013-12-30 ENCOUNTER — Ambulatory Visit (HOSPITAL_BASED_OUTPATIENT_CLINIC_OR_DEPARTMENT_OTHER): Payer: MEDICARE | Admitting: Internal Medicine

## 2013-12-30 VITALS — BP 120/80 | HR 64 | Wt 113.0 lb

## 2013-12-30 DIAGNOSIS — F419 Anxiety disorder, unspecified: Secondary | ICD-10-CM

## 2013-12-30 DIAGNOSIS — Z01818 Encounter for other preprocedural examination: Secondary | ICD-10-CM

## 2013-12-30 DIAGNOSIS — H269 Unspecified cataract: Principal | ICD-10-CM

## 2013-12-30 MED ORDER — LORAZEPAM 1 MG PO TABS
1.00 mg | ORAL_TABLET | Freq: Four times a day (QID) | ORAL | Status: AC | PRN
Start: 2013-12-30 — End: 2014-01-29

## 2013-12-30 NOTE — Progress Notes (Signed)
Admission Note  Brianna [name] is a 58 year old female who is here for a preoperative physical.   I last saw her 11/21/2013  She has an upcoming cataract OS surgery.   Scheduled for: next week.       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  [place] in [place].  Lives alone.  Single since '05.  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         Smoking Status: Never Smoker                      Alcohol Use: No                 Current Outpatient Prescriptions:  prednisoLONE acetate (PRED FORTE) 1 % ophthalmic suspension Place 1 drop into the right eye 4 (four) times daily. Disp: 15 mL Rfl: 1   THEANINE PO Take  by mouth. Disp:  Rfl:    Multiple Vitamin (MULTIVITAMINS PO)  Disp:  Rfl:      No current facility-administered medications for this visit.  Allergy History: Penicillins    If pre-op for cataract surgery: any history of alpha-blocker use?  NO.      REVIEW OF SYSTEMS:  Skin: negative  Eyes: negative  Ears/Nose/Throat: negative  Respiratory: negative  Cardiovascular: negative  Gastrointestinal: negative  Genitourinary: negative  Musculoskeletal: negative  Neurologic: negative  Psychiatric: negative  Hematologic/Lymphatic/Immunologic: negative  Endocrine: negative    PHYSICAL EXAMINATION:  BP 120/80  Pulse 64  Wt 113 lb (51.256 kg)  SpO2 96%   Comfortable-appearing; alert, ambulatory, attentive.   HEENT unremarkable  Neck supple  Chest clear to auscultation  Cor regular rate and rhythm, no murmur  Extremities unremarkable, no edema      HEMATOCRIT 40.8 34.1 - 44.9 %   Date: 11/21/13.    11/21/13 ekg: EKG: NSR at 76, nl axis, nl  intervals, no ST-T changes: nl EKG.     ASSESSMENT/PLAN:   Patient is clear for surgery.  D/t tendency to anxiety, rx'd:    Medication Orders Placed This Encounter  LORazepam (ATIVAN) 1 MG tablet   Sig: Take 1 tablet by mouth every 6 (six) hours as needed.   Dispense:  10 tablet   Refill:  0     12/30/2013 9:02 AM     _____________________________________  Tiburcio Bash   Pager: -[phone]      ================================================================    History and Physical Reviewed: (if admission is more than 24 hours after above H&P)  _____  No Change  _____  Changes as follows:        Date: _______________    Time: ______________      __________________________________       Pager: ______________  Provider Signature

## 2014-01-19 NOTE — Addendum Note (Signed)
Addended by: Charise Carwin on: 01/19/2014 01:35 PM     Modules accepted: Orders

## 2014-01-19 NOTE — Addendum Note (Signed)
Addended by: Charise Carwin on: 01/19/2014 01:20 PM     Modules accepted: Orders

## 2014-01-19 NOTE — Addendum Note (Signed)
Addended by: Charise Carwin on: 01/19/2014 01:31 PM     Modules accepted: Orders

## 2014-01-20 ENCOUNTER — Ambulatory Visit (HOSPITAL_BASED_OUTPATIENT_CLINIC_OR_DEPARTMENT_OTHER)
Admit: 2014-01-20 | Discharge: 2014-01-20 | Disposition: A | Discharge status: Home / Self Care | Payer: Self-pay | Source: Ambulatory Visit | Attending: Ophthalmology | Admitting: Ophthalmology

## 2014-01-20 ENCOUNTER — Encounter (HOSPITAL_BASED_OUTPATIENT_CLINIC_OR_DEPARTMENT_OTHER): Payer: Self-pay | Admitting: Ophthalmology

## 2014-01-20 HISTORY — DX: Presence of spectacles and contact lenses: Z97.3

## 2014-01-20 HISTORY — DX: Anxiety disorder, unspecified: F41.9

## 2014-01-20 NOTE — H&P (Signed)
Patient had right cataract with IOL implant done on April 18th 2015 under MAC .Interim no medical problems..Did not last experience and wishes to get sedation this time.  Plan MAC

## 2014-01-20 NOTE — Discharge Instructions (Signed)
SURGICAL DAY CARE PRE-OPERATIVE INSTRUCTIONS    DAY OF SURGERY    Arrive at: [facility] Registration on Wednesday, June  17 at :  10am    You must have a responsible adult available to accompany you home after surgery. (We suggest that you have someone available to assist you at home after your surgery).    INSTRUCTIONS:      You may have nothing to eat or drink after midnight the night before your surgery, not even water, gum or hard candy.      You may not smoke the morning of your surgery.     Please leave all valuables at home, including jewelry, watches, money, etc.     Please remove all fingernail polish before arriving at Surgical Day Care and do not  wear any face or lip make-up.     Do not shave surgical site.     Do not wear contact lenses.    MEDICATION  Take the following medication the night before surgery at bedtime: all routine medicines    Take the following medication the morning of your surgery with only a sip of water: none

## 2014-01-25 ENCOUNTER — Encounter (HOSPITAL_BASED_OUTPATIENT_CLINIC_OR_DEPARTMENT_OTHER): Payer: Self-pay | Admitting: Ophthalmology

## 2014-01-25 ENCOUNTER — Ambulatory Visit (HOSPITAL_BASED_OUTPATIENT_CLINIC_OR_DEPARTMENT_OTHER)
Admit: 2014-01-25 | Disposition: A | Discharge status: Home / Self Care | Payer: Self-pay | Source: Ambulatory Visit | Attending: Ophthalmology | Admitting: Ophthalmology

## 2014-01-25 MED ORDER — FLURBIPROFEN SODIUM 0.03 % OP SOLN
1.0000 [drp] | OPHTHALMIC | Status: AC
Start: 2014-01-25 — End: 2014-01-25
  Administered 2014-01-25 (×3): 1 [drp] via OPHTHALMIC

## 2014-01-25 MED ORDER — ACETAMINOPHEN 325 MG PO TABS
650.0000 mg | ORAL_TABLET | Freq: Four times a day (QID) | ORAL | Status: DC | PRN
Start: 2014-01-25 — End: 2014-01-25
  Administered 2014-01-25: 650 mg via ORAL
  Filled 2014-01-25: qty 2

## 2014-01-25 MED ORDER — LIDOCAINE HCL (PF) 1 % IJ SOLN
INTRAMUSCULAR | Status: AC
Start: 2014-01-25 — End: 2014-01-25
  Administered 2014-01-25: 13:00:00
  Filled 2014-01-25: qty 2

## 2014-01-25 MED ORDER — TROPICAMIDE 1 % OP SOLN
1.0000 [drp] | OPHTHALMIC | Status: AC
Start: 2014-01-25 — End: 2014-01-25
  Administered 2014-01-25 (×3): 1 [drp] via OPHTHALMIC

## 2014-01-25 MED ORDER — FENTANYL CITRATE 0.05 MG/ML IJ SOLN
INTRAMUSCULAR | Status: AC
Start: 2014-01-25 — End: 2014-01-25
  Filled 2014-01-25: qty 2

## 2014-01-25 MED ORDER — BSS IO SOLN
INTRAOCULAR | Status: AC
Start: 2014-01-25 — End: 2014-01-25
  Administered 2014-01-25: 13:00:00
  Filled 2014-01-25: qty 30

## 2014-01-25 MED ORDER — MOXIFLOXACIN HCL 0.5 % OP SOLN
1.0000 [drp] | OPHTHALMIC | Status: AC
Start: 2014-01-25 — End: 2014-01-25
  Administered 2014-01-25 (×3): 1 [drp] via OPHTHALMIC

## 2014-01-25 MED ORDER — GENTAMICIN SULFATE 40 MG/ML IJ SOLN
INTRAMUSCULAR | Status: AC
Start: 2014-01-25 — End: 2014-01-25
  Administered 2014-01-25: 20 mg via INTRAMUSCULAR
  Filled 2014-01-25: qty 2

## 2014-01-25 MED ORDER — DEXAMETHASONE SODIUM PHOSPHATE 4 MG/ML IJ SOLN
INTRAMUSCULAR | Status: AC
Start: 2014-01-25 — End: 2014-01-25
  Administered 2014-01-25: 2 mg
  Filled 2014-01-25: qty 1

## 2014-01-25 MED ORDER — PHENYLEPHRINE HCL 2.5 % OP SOLN
1.0000 [drp] | OPHTHALMIC | Status: AC
Start: 2014-01-25 — End: 2014-01-25
  Administered 2014-01-25 (×3): 1 [drp] via OPHTHALMIC

## 2014-01-25 MED ORDER — BSS IO SOLN
Freq: Once | INTRAOCULAR | Status: AC
Start: 2014-01-25 — End: 2014-01-25
  Administered 2014-01-25: 13:00:00 via OPHTHALMIC
  Filled 2014-01-25: qty 0.5

## 2014-01-25 MED ORDER — ERYTHROMYCIN 5 MG/GM OP OINT
0.50 [in_us] | TOPICAL_OINTMENT | Freq: Once | OPHTHALMIC | Status: AC
Start: 2014-01-25 — End: 2014-01-25
  Administered 2014-01-25: 0.5 [in_us] via OPHTHALMIC

## 2014-01-25 MED ORDER — ATROPINE SULFATE 1 % OP SOLN
OPHTHALMIC | Status: DC
Start: 2014-01-25 — End: 2014-01-25
  Filled 2014-01-25: qty 5

## 2014-01-25 MED ORDER — LACTATED RINGERS IV SOLN
INTRAVENOUS | Status: DC
Start: 2014-01-25 — End: 2014-01-25
  Administered 2014-01-25: 12:00:00 via INTRAVENOUS

## 2014-01-25 MED ORDER — ACETYLCHOLINE CHLORIDE 1:100 IO SOLR
INTRAOCULAR | Status: DC
Start: 2014-01-25 — End: 2014-01-25
  Filled 2014-01-25: qty 2

## 2014-01-25 MED ORDER — MIDAZOLAM HCL 2 MG/2ML IJ SOLN
INTRAMUSCULAR | Status: AC
Start: 2014-01-25 — End: 2014-01-25
  Filled 2014-01-25: qty 2

## 2014-01-25 MED ORDER — TETRACAINE HCL 0.5 % OP SOLN
OPHTHALMIC | Status: AC
Start: 2014-01-25 — End: 2014-01-25
  Administered 2014-01-25: 13:00:00
  Filled 2014-01-25: qty 2

## 2014-01-25 MED ORDER — CYCLOPENTOLATE HCL 1 % OP SOLN
1.0000 [drp] | OPHTHALMIC | Status: AC
Start: 2014-01-25 — End: 2014-01-25
  Administered 2014-01-25 (×3): 1 [drp] via OPHTHALMIC

## 2014-01-25 MED ORDER — BUPIVACAINE HCL (PF) 0.75 % IJ SOLN
INTRAMUSCULAR | Status: DC
Start: 2014-01-25 — End: 2014-01-25
  Filled 2014-01-25: qty 10

## 2014-01-25 MED ORDER — LIDOCAINE-EPINEPHRINE 2 %-1:200000 IJ SOLN
INTRAMUSCULAR | Status: DC
Start: 2014-01-25 — End: 2014-01-25
  Filled 2014-01-25: qty 20

## 2014-01-25 NOTE — Interval H&P Note (Signed)
ANESTHESIA INTERVAL NOTE   History and chart reviewed, patient examined prior to administration of anesthesia. Feels well. No change in clinical condition. NPO. Appears stable for left cataract removal today. VSS. See Dr. Luretha Rued note from 4/17.  BP 119/70  Pulse 58  Temp(Src) 98.1 F (36.7 C) (Temporal)  Resp 18  SpO2 100%   Plan - MAC   01/25/2014, 12:34 PM  Pager [phone]

## 2014-01-25 NOTE — Op Note (Signed)
Date of Operation: 01/25/2014    PREOPERATIVE DIAGNOSIS:  Cataract  left eye.     POSTOPERATIVE DIAGNOSIS:  Cataract, left eye.     OPERATION:  Clear corneal cataract extraction with posterior chamber intraocular lens, model Crystalens AT-52A0 (9.0 diopters), left eye.     SURGEON:  Wenda Low. Marston Mccadden, MD     ANESTHESIA:  Intracameral and topical     COMPLICATIONS:  None.     SPECIMENS:  None     ASSISTANT:  None.                 PROCEDURE:  The left eye was prepped and draped in the usual manner appropriate for intraocular surgery.  The operating microscope was used for good surgical control at all times.   A paracentesis was performed with a super blade.  Preservative free lidocaine was injected into the anterior chamber.  A viscoelastic material was then injected into the anterior chamber to deepen it.  A clear corneal temporal wound was created using a 2.8 mm keratome in a triplanar fashion approximately 1.5 mm from the limbus. A continuous curvilinear  capsulorrhexis was performed using a bent-needle cystotome and capsulorrhexis forceps.  Phacoemulsification of the nucleus was performed using a quartering-and-cracking technique. Automated irrigation/aspiration of the residual cortical material was then performed.  The intraocular lens was then injected into the capsular bag. Automated irrigation/aspiration of  residual viscoelastic material was then performed.  Balanced salt solution was used to inflate the anterior chamber.  The wound was tested and found to be watertight.  A Subtenon injections with 50 mg Kefzol and 2 mg dexamethasone were administered inferiorly, well away from the wound.  A drop of Vigamox was placed on the surface of the globe, as well of a ribbon of Ilotycin ointment.  A Sterile shield was placed over the eye and secured with paper tape.     The patient was returned to the ambulatory Day Surgery Unit in good conditions.  The patient was instructed to start taking Vigamox and Prednisolone  eye drops in the post operative eye starting 4 hours after the surgery.     The patient was also instructed to follow up with Dr. Lonia Chimera the following day and to call the office and have him paged if there are any concerns at any time.    ADDENDUM:  At the completion of the procedure, a single 10-0 nylon suture was used to compress the clear corneal wound, and a drop of atropine 1% was instilled into the left eye.    ___________________________  Reviewed and Electronically Signed By: Benjaman Kindler M.D.  Sig Date: 02/23/2014  Sig Time: 14:31:43  Dictated By: Benjaman Kindler M.D.  Dict Date: 01/25/2014 Dict Time: 01 30 PM    Dictation Date and Time:01/25/2014 13:30:48  Transcription Date and Time:01/25/2014 13:47:21  eScription Dictation id: [id number] Confirmation # :[id number]

## 2014-01-25 NOTE — Brief Op Note (Signed)
Brief Procedure or Operative Note    Procedure: Cataract Surgery  left eye    Pre-Procedure Diagnosis: cataract   left eye    Post-Procedure Diagnosis: Same    Surgeon: Wenda Low. Barry Culverhouse, MD    Assistant:  None    Type of Anesthesia: Local     Findings: Cataract left eye    Estimated Blood Loss: minimal    Specimens Removed: none    Complications:  None    Other (e.g. Implants):  Other: Crystalens AT-52AO +9.00        Charise Carwin, 01/25/2014, 1:34 PM      Pager : [phone]                  .                  [name]

## 2014-01-25 NOTE — Discharge Instructions (Signed)
You need to be seen at the [facility] tomorrow.  The office is located at the [facility], [address], [place].  The phone number is [phone] if you have any questions.     Start taking Vigamox and Pred forte one drop of each in the eye that had surgery today  4 time per day starting TODAY after Dinner (around 6 PM).  Use both drops after lunch, dinner, before bed, and after breakfast.  Put the shield back over your eye between taking the drops and secure with a piece of tape    Call the office  [phone] if you have any problems of concerns.  The answering service will answer the call 24 hours per day.  Ask them to page Dr. Dvante Hands

## 2014-01-25 NOTE — H&P (Signed)
Patient Assessment Update: (Fill out Prior to procedure or within 24 hours of  admission if H&P done pre-admission.)   Re-evaluation including history review and physical examination has been performed.    Patient's Condition No Change    Brianna [name], 01/25/2014, 12:47 PM

## 2014-01-25 NOTE — H&P (View-Only) (Signed)
Patient had right cataract with IOL implant done on April 18th 2015 under [facility] .Interim no medical problems..Did not last experience and wishes to get sedation this time.  Plan MAC

## 2014-01-26 ENCOUNTER — Ambulatory Visit (HOSPITAL_BASED_OUTPATIENT_CLINIC_OR_DEPARTMENT_OTHER): Payer: MEDICARE | Admitting: Ophthalmology

## 2014-01-26 DIAGNOSIS — IMO0002 Reserved for concepts with insufficient information to code with codable children: Secondary | ICD-10-CM

## 2014-01-26 MED ORDER — PREDNISOLONE ACETATE 1 % OP SUSP
1.00 [drp] | Freq: Four times a day (QID) | OPHTHALMIC | Status: AC
Start: 2014-01-26 — End: 2014-02-26

## 2014-01-26 MED FILL — Phenylephrine HCl Ophth Soln 2.5%: OPHTHALMIC | Qty: 15 | Status: AC

## 2014-01-26 MED FILL — Erythromycin Ophth Oint 5 MG/GM: OPHTHALMIC | Qty: 3.5 | Status: AC

## 2014-01-26 MED FILL — Moxifloxacin HCl Ophth Soln 0.5% (Base Equiv): OPHTHALMIC | Qty: 3 | Status: AC

## 2014-01-26 MED FILL — Cyclopentolate HCl Ophth Soln 2%: OPHTHALMIC | Qty: 2 | Status: AC

## 2014-01-26 MED FILL — Tropicamide Ophth Soln 1%: OPHTHALMIC | Qty: 15 | Status: AC

## 2014-01-26 MED FILL — Prednisolone Acetate Ophth Susp 1%: OPHTHALMIC | Qty: 5 | Status: AC

## 2014-01-26 MED FILL — Flurbiprofen Sodium Ophth Soln 0.03%: OPHTHALMIC | Qty: 2.5 | Status: AC

## 2014-01-26 NOTE — Progress Notes (Signed)
Brianna [name] is day #1 s/p uncomplicated cataract surgery left eye.  Patient is doing well.  Pt should use Pred Forte 1% and Vigamox 1 gtt in the post operative eye QID.  The patient was instructed to cover the operated eye with the metal shield while sleeping for the first week and to return to clinic in  one week for follow up.  Patient also instructed to return to clinic immediately if there is decreased vision, ocular pain, or any other problems that develop.

## 2014-01-26 NOTE — Nursing Note (Signed)
1 day po s/p  Phaco w/ PC IO OS (Crytalens ) little pain took tylenol and that helped.     2 mo po s/p phaco w/ PC IOL OD (trulign toric) No complaints.

## 2014-02-02 ENCOUNTER — Ambulatory Visit (HOSPITAL_BASED_OUTPATIENT_CLINIC_OR_DEPARTMENT_OTHER): Payer: MEDICARE | Admitting: Ophthalmology

## 2014-02-02 DIAGNOSIS — H5213 Myopia, bilateral: Secondary | ICD-10-CM

## 2014-02-02 DIAGNOSIS — IMO0002 Reserved for concepts with insufficient information to code with codable children: Principal | ICD-10-CM

## 2014-02-02 NOTE — Progress Notes (Signed)
Brianna [name] was seen for follow up at the [facility] center.    She is doing well s/p cataract surgery with Crystalens implant OU.    She will d/c Vigamox.  She will continue pred forte qid x 1 month.    She will follow up with Dr. Nyoka Cowden in 1 month for a final RX. And dilated fundus exam.

## 2014-02-02 NOTE — Nursing Note (Addendum)
1 week s/p  Phaco w/ PC IO OS (Crytalens ) 01/22/2014 by Wenda Low. Patalano, MD   2 month s/p phaco w/ PC IOL OD (trulign toric) 11/30/2013 by Wenda Low. Patalano, MD    Pt happy with distance and intermediate [facility].  Some trouble reading small print.  Pt was expecting better near [place].  Pt still has to wear +1.50 OTC readers.

## 2014-02-14 ENCOUNTER — Ambulatory Visit (HOSPITAL_BASED_OUTPATIENT_CLINIC_OR_DEPARTMENT_OTHER): Payer: MEDICARE | Admitting: Internal Medicine

## 2014-02-14 ENCOUNTER — Encounter (HOSPITAL_BASED_OUTPATIENT_CLINIC_OR_DEPARTMENT_OTHER): Payer: Self-pay | Admitting: Internal Medicine

## 2014-02-14 VITALS — BP 110/64 | Wt 117.0 lb

## 2014-02-14 DIAGNOSIS — R5383 Other fatigue: Secondary | ICD-10-CM

## 2014-02-14 DIAGNOSIS — T679XXA Effect of heat and light, unspecified, initial encounter: Secondary | ICD-10-CM

## 2014-02-14 DIAGNOSIS — R8290 Unspecified abnormal findings in urine: Secondary | ICD-10-CM

## 2014-02-14 DIAGNOSIS — R21 Rash and other nonspecific skin eruption: Secondary | ICD-10-CM

## 2014-02-14 DIAGNOSIS — R102 Pelvic and perineal pain unspecified side: Secondary | ICD-10-CM

## 2014-02-14 DIAGNOSIS — R3915 Urgency of urination: Principal | ICD-10-CM

## 2014-02-14 DIAGNOSIS — N9489 Other specified conditions associated with female genital organs and menstrual cycle: Secondary | ICD-10-CM

## 2014-02-14 LAB — URINALYSIS
BILIRUBIN, URINE: NEGATIVE
CASTS: NONE SEEN PER LPF
CRYSTALS: NONE SEEN
GLUCOSE, URINE: NEGATIVE MG/DL
KETONE, URINE: NEGATIVE MG/DL
LEUKOCYTE ESTERASE: NEGATIVE
NITRITE, URINE: NEGATIVE
PH URINE: 6 (ref 5.0–8.0)
PROTEIN, URINE: NEGATIVE MG/DL
SPECIFIC GRAVITY URINE: 1.01 (ref 1.003–1.035)

## 2014-02-14 LAB — GLUCOSE RANDOM: Glucose Random: 96 mg/dL (ref 74–160)

## 2014-02-14 LAB — HOLD PURPLE TOP TUBE

## 2014-02-14 LAB — THYROID SCREEN TSH REFLEX FT4: THYROID SCREEN TSH REFLEX FT4: 0.684 u[IU]/mL (ref 0.358–3.740)

## 2014-02-14 NOTE — Progress Notes (Signed)
Brianna [name] is a 58 year old patient  Here today re:   Fatigue: actually a chronic issue; Denies new medication or discontinued medication; denies feeling ill, stress, depression, overwork, change in family situation, change in job situation, not getting enough exercise or not getting enough sleep.  Denies daytime hyper-somnolence or waking up fatigued or waking up with a headache.  Urinary urgency: but no urinary frequency, no dysuria. No fever, chills, sweats; otherwise well.  Facial rash? Has had a few folliculitis-like bumps on chin and laterally to mouth, also plucks hairs here, may be related.  No similar symptoms elsewhere in body.   Heat sensitivity?  Feels more sensitive to heat.  No decreased or increased weight, no feeling cold/chilly, no diarrhea or constipation, no undue sweatiness, anxiety or palpitations.   Vulvovaginal: noted a small flap of skin btwn lab maj and perineum, no assoc'd sx's - no pain / itch /lesion(s).  Abnormal urine odor? Noted recently.  nb was tx'd for DI Vaginitis w clinda, those sx's resolved.      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  Previously in [place].  Lives alone.  Single since '05.  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         Smoking Status: Never Smoker                      Alcohol Use: No                 Current Outpatient Prescriptions:  prednisoLONE acetate (PRED FORTE) 1 % ophthalmic suspension Place 1 drop into the left eye 4 (four) times daily. Disp: 10 mL Rfl: 1   THEANINE  PO Take  by mouth. Disp:  Rfl:    Multiple Vitamin (MULTIVITAMINS PO)  Disp:  Rfl:      No current facility-administered medications for this visit.  Reports no difficulties with meds, co-pays or insurance  Payor: MEDICARE / Plan: MEDICARE / Product Type: *No Product type* /   Allergy History: Penicillins    Health maintenance: due for: HEALTH CARE PROXY due on 07/03/1972      PHYSICAL EXAMINATION:  BP 110/64  Wt 117 lb (53.071 kg)   Comfortable-appearing; alert, ambulatory, attentive.  Communication unimpaired. Affect appropriate to situation.    Cognitive status is at baseline.  HEENT: eyes anicteric conjunctivae normal  No tachycardia, no tremor; some follicular inflammation around chin, otherwise normal appearance.  No proptosis  Thyroid gland non-enlarged, non-nodular  Neck without masses  Chest clear to auscultation  Cor regular rate and rhythm, no murmur  Extremities unremarkable, no edema, no myxedematous changes  Reflexes are normal at biceps, brachioradialis, knees, ankles bilaterally    Pelvic:  No inguinal lymphadenopathy.  No lesions observed on mons, inner thighs, perineum. Labia majora and minora normal. The area of skin she  has noted is a thin fold of normal-appearing skin connecting her lower L labium majorum to perineum; nontender, no lesions, looks normal. Urethral meatus and introitus atrophic/ nl for age.  Vagina swabbed, no discharge seen.     Microscopic wet-mount exam shows only normal epithelial cells and vaginal pH is 6.5, but whiff test neg.     Most Recent Weight Reading(s)  02/14/14 : 117 lb (53.071 kg)  01/25/14 : 115 lb (52.164 kg)  01/20/14 : 115 lb 12.8 oz (52.527 kg)      ASSESSMENT/PLAN:  1. Urinary urgency  Reassured that unlikely UTI or other problem, but:  - Urinalysis    2. Facial rash:  I think likely some mild follicular irritation perhaps d/t plucking hair.  No intervention at this time, continue self-care.    3. Heat sensitivity, initial encounter  TSH-C    4.  Fatigue  Explained that there are many causes of fatigue including    - Health Care Proxy  - ROUTINE VENIPUNCTURE  - Thyroid Screen TSH Reflex FT4  - Hold Purple Top Tube    5. Vulvovaginal discomfort  Reassured exam is nl.    6. Abnormal urine odor  Sometimes can be d/t vaginitis; looked for BV, but no evidence of.  Asked her to keep an eye on sx and notice if dietary or supplement changes are related.    Counselling-oriented visit, 25 minutes spent face-to-face with patient, of which greater than 50% was direct counselling; topics covered included discussing diagnoses, reviewing typical symptoms and  approaches to workup, and options for management/treatment.    We discussed her med(s) and the importance of medication compliance. The patient was ready to learn and no apparent learning barriers were identified. I explained the diagnosis and treatment plan, and the patient expressed understanding of the content. Possible side effects of the prescribed medication(s) were explained.  I attempted to answer any questions regarding the diagnosis and the proposed treatment.      1. The patient indicates understanding of these issues and agrees with the plan.  2. Past medical history, medications, problem list, allergies, family history, health habits, social history: reviewed, updated, as noted in EPIC.   3. I reconciled the patient's medication list and prepared and supplied needed refills.    Electronically signed by: Blima Singer MD, 02/14/2014 3:52 PM

## 2014-03-03 ENCOUNTER — Ambulatory Visit (HOSPITAL_BASED_OUTPATIENT_CLINIC_OR_DEPARTMENT_OTHER): Payer: MEDICARE | Admitting: Ophthalmology

## 2014-03-03 DIAGNOSIS — IMO0002 Reserved for concepts with insufficient information to code with codable children: Principal | ICD-10-CM

## 2014-03-03 NOTE — Progress Notes (Signed)
Assessment:  1)1 month s/p Phaco w/ PC IO OS (Crytalens ) 01/22/2014 and 3 month s/p phaco w/ PC IOL OD (trulign toric) (11/30/2013). Doing well.    Plan:  1) Patient educated on findings. Continue using +1.50 OTC readers as needed for fine print. Monitor in 3 months with Dr. Lonia Chimera, or immediately with changes in vision or other problems.

## 2014-03-03 NOTE — Nursing Note (Signed)
Patient here for 1 month s/p Phaco w/ PC IO OS (Crytalens ) 01/22/2014 and 3 month s/p phaco w/ PC IOL OD (trulign toric) (11/30/2013).    Has discontinued all post operative drops. No stark pain.     Patient is using +1.50 OTC readers at times for small print online. Also for fine print.

## 2014-03-30 ENCOUNTER — Other Ambulatory Visit (HOSPITAL_BASED_OUTPATIENT_CLINIC_OR_DEPARTMENT_OTHER): Payer: Self-pay | Admitting: Physician Assistant

## 2014-03-30 DIAGNOSIS — Z1239 Encounter for other screening for malignant neoplasm of breast: Secondary | ICD-10-CM

## 2014-03-31 ENCOUNTER — Ambulatory Visit (HOSPITAL_BASED_OUTPATIENT_CLINIC_OR_DEPARTMENT_OTHER): Payer: Self-pay | Admitting: Internal Medicine

## 2014-03-31 DIAGNOSIS — Z1239 Encounter for other screening for malignant neoplasm of breast: Principal | ICD-10-CM

## 2014-04-07 LAB — MA SCREENING MAMMO BILATERAL WITH CAD

## 2014-04-07 NOTE — Addendum Note (Signed)
Addended byIsaac Laud, MONICA on: 04/07/2014 08:10 AM     Modules accepted: Orders

## 2014-05-30 ENCOUNTER — Ambulatory Visit (HOSPITAL_BASED_OUTPATIENT_CLINIC_OR_DEPARTMENT_OTHER): Payer: MEDICARE | Admitting: Internal Medicine

## 2014-05-30 VITALS — BP 90/60 | HR 57 | Temp 98.8°F | Ht 63.0 in | Wt 118.0 lb

## 2014-05-30 DIAGNOSIS — Z23 Encounter for immunization: Secondary | ICD-10-CM

## 2014-05-30 DIAGNOSIS — Z0000 Encounter for general adult medical examination without abnormal findings: Principal | ICD-10-CM

## 2014-05-30 DIAGNOSIS — R140 Abdominal distension (gaseous): Secondary | ICD-10-CM

## 2014-05-30 DIAGNOSIS — Z8739 Personal history of other diseases of the musculoskeletal system and connective tissue: Secondary | ICD-10-CM

## 2014-05-30 NOTE — Progress Notes (Signed)
Brianna [name] is a 58 year old woman  Here for her annual physical.      Re: fatigue: a little better; ?d/t taking B12.  Re: hallux valgus: bunion "acting up" a little lately  And also - some sciatica on R side -?related to uneven d/t above.  GI: digestion: Has been more gassy x many months.  She can identify no precipitants. She can't identify anything which alleviates it or exacerbates it. Has tried no remedies so far.      No consistent relationship w food - diet: non-veg, Quinoa, kale.. ?sensitive to dairy (milk) and gluten (avoids).  But not relationship w gluten and gas.   No constipation.  No change in stools.    She is due for AWQ Questionnaire due on 07/03/1972  FLU VACCINE SEASONAL due on 04/11/2014  PHYSICAL EXAM (AGE 79+) due on 04/29/2014  Her past medical history is significant for   Patient Active Problem List:     POSTTRAUMATIC STRESS DISORDER     FATIGUE GENERAL     FAMILY HX-BREAST MALIG     Screening for unspecified condition     Dissociative disorder or reaction, unspecified     Major Depressive Disorder, Recurrent Episode, Moderate     Bunion     Menopause     Vitamin D Deficiency     High myopia, both eyes     Pseudophakia     History of osteopenia       Current Outpatient Prescriptions:  THEANINE PO Take  by mouth. Disp:  Rfl:    Multiple Vitamin (MULTIVITAMINS PO)  Disp:  Rfl:      No current facility-administered medications for this visit.  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  Previously in [place].  Lives alone.  Single since '05.  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       Denies changes in energy level, appetite, sleep, mood and memory/concentration. Not troubled by new or changing headaches.  No new  concerns re: eyes, vision. No new dental problems.   Breathing: normal; no unexplained shortness of breath or cough. No symptoms in chest - no chest pressure or any other new or uncomfortable  sensations. GI: no new issues at present. No changes in bowels. No genitourinary symptoms.    GYN: she is postmenopausal and having no bleeding.  Gynecologic or vulvovaginal symptoms: none.    Sexual History: not currently sexually active.      HUMAN PAPILLOMAVIRUS (no units)   Date Value   05/27/2013 Negative for HPV High Risk mRNA  (HPV types: 16,18,31,33,35,39,45,51,52,56,58,59,66,68)   12/05/2010 Negative for High Risk DNA Probe  (HPV types: 16,18,31,33,35,39,45,51,52,56,58,59,68)   ----------        OBJECTIVE:   BP 90/60 mmHg  Pulse 57  Temp(Src) 98.8 F (37.1 C) (Temporal)  Ht 5\' 3"  (1.6 m)  Wt 53.524 kg (118 lb)  BMI 20.91 kg/m2  SpO2 98%   Alert, ambulatory, attentive, appropriate.   Communication unimpaired. Affect appropriate to situation.    Cognitive status is at baseline.  HEENT: Scalp normal. Conjunctivae and sclerae are normal.  Oropharynx normal.  Neck  is supple without masses, no cervical lymphadenopathy.  No thyromegaly.  Back without deformity.  Chest is clear to auscultation.   Heart sounds: normal S1 and S2, regular rate and rhythm, no murmurs.  Breasts: normal without suspicious masses, skin or nipple changes or axillary nodes.   Abdomen is soft, no tenderness or masses, no appreciable organomegaly. Hyperactive bowel sounds.   Pelvic: not indicated.   Extremities without cyanosis or edema  Skin unremarkable, warm and dry, no rash    Most Recent Weight Reading(s)  05/30/14 : 53.524 kg (118 lb)  02/14/14 : 53.071 kg (117 lb)  01/25/14 : 52.164 kg (115 lb)  01/20/14 : 52.527 kg (115 lb 12.8 oz)  12/30/13 : 51.256 kg (113 lb)      Last few blood pressures:  Most Recent BP Reading(s)  02/14/14 : 110/64  01/25/14 : 114/78  01/20/14 : 109/73  Labs:   LOW DENSITY LIPOPROTEIN DIRECT (mg/dL)   Date Value      04/29/2013 116   ----------   HIGH DENSITY LIPOPROTEIN (mg/dL)   Date Value   04/29/2013 60   ----------   HEMATOCRIT (%)   Date Value   11/21/2013 40.8   ----------    GLUCOSE RANDOM (mg/dL)   Date Value   02/14/2014 96   ----------     ALANINE AMINOTRANSFERASE (IU/L)   Date Value   11/26/2007 19   ----------   THYROID SCREEN TSH REFLEX FT4 (uIU/mL)   Date Value   02/14/2014 0.684   ----------    ASSESSMENT/PLAN:   1. Encounter for Medicare annual wellness exam  done    2. Flatulence/gas pain/belching  rvwd multi factors which can contribute to.  Discussed ?trial fodmaps diet - she'd like to try - given handout.    3. Need for prophylactic vaccination and inoculation against influenza  - Influenza Virus Quad w/Presv 3/> YRS IM (PRIVATE)    We discussed her med(s) and the importance of medication compliance. The patient was ready to learn and no apparent learning barriers were identified. I explained the diagnosis and treatment plan, and the patient expressed understanding of the content. Possible side effects of the prescribed medication(s) were explained.  I attempted to answer any questions regarding the diagnosis and the proposed treatment.    Also:  Preventive health care:  Reviewed: ways to improve diet, keep a healthy weight, maintain a regular exercise routine. Discussed ways that she can reduce her risk of neurovascular, cardiovascular, and renal disease.     1. The patient indicates understanding of these issues and agrees with the plan.  2. I reviewed the patient's medical information and medical history   3.  I reconciled the patient's medication list and prepared and supplied needed refills.  4.  I have reviewed the past medical, family, and social history sections including the medications and allergies listed in the above medical record    Electronically signed by: Tiburcio Bash, MD, 05/30/2014 8:01 AM                   Annual Wellness Visit  Progress Note     Subjective:   Brianna [name] is a  58 year old female who comes for the Subsequent Annual wellness visit, including Personalized Prevention Plan (PPPE) (it has been at least 12 months since the annual wellness visit)..     History: (These sections have been updated in their respective activities)   Review of Patient's Allergies indicates:   Penicillins  Hives    Comment:childhood.    Past Medical History    Major depressive disorder, single episode, moderate     Disorder of bone and cartilage, unspecified BMD 8/04    Comment: Osteopenia only, femoral neck.    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 MAH: EGD, Cscopy 09/09/06: all normal. 2/08: rslvd.    Thyroid disease     Myopia     Astigmatism     Presbyopia     Cataract     Anxiety     Wears eyeglasses          Past Surgical History    EXCISION TONSIL TAGS      IMPLANT MESH OPN HERNIA RPR/DEBRIDEMENT CLOSURE      Comment inguinal hernia    CATARACT REMOVAL INSERTION OF LENS         Family History    Mental/Emotional Disorders Mother     Mental/Emotional Disorders Father     Mental/Emotional Disorders Sister     Cancer - Breast Mother     Comment: dx'd at 35. D. age 34.       Social History   Marital Status: Single  Spouse Name: N/A    Years of Education: N/A  Number of Children: 0     Occupational History  disability      NONE      Social History Main Topics   Smoking status: Never Smoker     Smokeless tobacco: Not on file    Alcohol Use: No    Drug Use: No    Sexual Activity: Not Currently    Partners: Male    Comment: not since '05.     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  Previously in [place].  Lives alone.  Single since '05.  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       Current Outpatient Prescriptions:  THEANINE PO Take  by mouth. Disp:  Rfl:    Multiple Vitamin (MULTIVITAMINS PO)  Disp:  Rfl:           No current facility-administered medications for this visit.    Providers and Suppliers (See demographics, encounters tab, filter by provider)  Tiburcio Bash, MD (General)    [place], [place].  Phone: [phone] Fax: [phone]        Screening:     Cognitive screen -  Cognitive impairment is not detected byobservation and patient report   Depression screen, See Questions 3-4 of Health Risk Assessment.  Last PHQ9 total score (Recorded from the Screening Questions section):  PHQ-9 TOTAL SCORE 02/26/2012   Questionnaire Total Score -   Doc FlowSheet Total Score 8      Vision screen -    Vision with corrective lenses: adequate for adl's.  Hearing loss screen -  Trouble hearing the television or radio when others can hear it? No  Straining or struggling to hear/understand conversations? No   Falls risk screen, See Questions 19-20 of Health Risk Assessment. Function screen, See Questions 8-13 of Health Risk Assessment.   Screen for overweight and hypertension:   BP 90/60 mmHg  Pulse 57  Temp(Src) 98.8 F (37.1 C) (Temporal)  Ht 5\' 3"  (1.6 m)  Wt 53.524 kg (118 lb)  BMI 20.91 kg/m2  SpO2 98%  Body mass index is 20.91 kg/(m^2).   One-time screening for abdominal aortic aneurysm (AAA) by ultrasonography in men aged 66 to 42 who have ever smoked:  Is not indicated in this female who reports that she has never smoked. She does not have any smokeless tobacco history on file.   Hepatitis B risk Assessment:    Because of lack of identifiable risk factors, her risk for hepatitis B is low (1) (moderate or high: consider Hep B vaccination)     Home safety screen:    Does home have throw rugs, poor lighting or slippery bathtub or shower? No  Does home LACK grab bars in bathrooms or handrails on stairs and steps? No  Does home LACK functioning smoke alarms? No   Health Risk Assessment: Recorded in the Screening Questions Section, can be reviewed in Flowsheets activity tab under  [place]'.     All above screening responses were reviewed, and all positive or abnormal responses were further evaluation or sent for referral as appropriate.    Counseling:     Advanced Care Planning:  The patient has identified a Healthcare Proxy (recorded in demographics section)    Health Maintenance: See Health Maintenance Report for details of screening done.    Health Maintenance Items Overdue and Due Soon  AWQ Questionnaire due on 07/03/1972  FLU VACCINE SEASONAL due on 04/11/2014  PHYSICAL EXAM (AGE 8+) due on 04/29/2014    All Health Maintenance Items and DueDates -  AWQ Questionnaire due on 07/03/1972  FLU VACCINE SEASONAL due on 04/11/2014  PHYSICAL EXAM (AGE 8+) due on 04/29/2014  MAMMOGRAPHY due on 04/07/2016  COLONOSCOPY due on 09/09/2016  LIPID SCREENING due on 04/29/2018  PAP SMEAR due on 05/27/2018  HPV SCREENING due on 05/27/2018  HEALTH CARE PROXY due on 02/15/2019  TETANUS (16 AND OVER) due on 04/30/2023  HIV SCREENING Completed  HEP B HIGH RISK VACCINE EVAL (ONCE) Completed    Assessment & Plan:     Acute Issues:  In addition to the above items, we also discussed the following acute issues:     Encounter for Medicare annual wellness exam  (primary encounter diagnosis)  Flatulence/gas pain/belching  Need for prophylactic vaccination and inoculation against influenza    Identified Problems and Risks:  Encounter for Medicare annual wellness exam  (primary encounter diagnosis)  Flatulence/gas pain/belching  Need for prophylactic vaccination and inoculation against influenza    Orders, Prescriptions, and Referrals placed in this Encounter:    Orders Placed This Encounter  Influenza Virus Quad w/Presv 3/> YRS IM (PRIVATE)    Personalized Instructions:   See Patient Instruction section

## 2014-07-05 ENCOUNTER — Ambulatory Visit (HOSPITAL_BASED_OUTPATIENT_CLINIC_OR_DEPARTMENT_OTHER): Payer: MEDICARE | Admitting: Ophthalmology

## 2014-07-05 DIAGNOSIS — H5213 Myopia, bilateral: Secondary | ICD-10-CM

## 2014-07-05 NOTE — Nursing Note (Signed)
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 Wears OTC reading glasses. Feels more dependent on glasses.  No ocular pain, redness, discomforts.  Denies flashes or floaters.

## 2014-07-05 NOTE — Progress Notes (Signed)
Brianna [name] was seen at the [facility].    She was doing well s/p cataract  OU with a Crystalens insert.    There were no acute ocular issues.    She was advised to rtc prn/1 year.    She was significantly myopic before cataract surgery and was reminded that she is at an increased risk of a retinal break or detachment and should rtc if she sees flashers or new floater.

## 2014-08-18 ENCOUNTER — Ambulatory Visit (HOSPITAL_BASED_OUTPATIENT_CLINIC_OR_DEPARTMENT_OTHER): Payer: MEDICARE | Admitting: Physician Assistant

## 2014-08-18 ENCOUNTER — Encounter (HOSPITAL_BASED_OUTPATIENT_CLINIC_OR_DEPARTMENT_OTHER): Payer: Self-pay | Admitting: Physician Assistant

## 2014-08-18 VITALS — BP 110/72 | HR 73 | Temp 97.6°F | Wt 117.4 lb

## 2014-08-18 DIAGNOSIS — M542 Cervicalgia: Secondary | ICD-10-CM

## 2014-08-18 DIAGNOSIS — Z658 Other specified problems related to psychosocial circumstances: Secondary | ICD-10-CM

## 2014-08-18 DIAGNOSIS — F439 Reaction to severe stress, unspecified: Secondary | ICD-10-CM

## 2014-08-18 DIAGNOSIS — Z72820 Sleep deprivation: Secondary | ICD-10-CM

## 2014-08-18 NOTE — Progress Notes (Signed)
S:    This 59 year old female comes to [facility] for neck pain. Felt like her bone was bruised. It was there for a day or so before she called. It has since improved. It was located in paraspinal muscles on the right. Pain came and went.  She has had this before but it has been a while. She was sitting and doing nothing when she first noticed the pain.Moving did not make it worse. Putting pressure on it made it worse. She did not take anything for the pain. She denies numbness/tingling. Does admit to sleeping poorly. The last 2-3 weeks she has been waking up really early. She is stressed, but has been for a long time. Pain currently 0/10.    Patient Active Problem List:     POSTTRAUMATIC STRESS DISORDER     FATIGUE GENERAL     FAMILY HX-BREAST MALIG     Screening for unspecified condition     Dissociative disorder or reaction, unspecified     Major Depressive Disorder, Recurrent Episode, Moderate     Bunion     Menopause     Vitamin D Deficiency     High myopia, both eyes     Pseudophakia     History of osteopenia    Review of Patient's Allergies indicates:   Penicillins             Hives    Comment:childhood.         Review of Systems   Constitutional: Negative for fever, chills and weight loss.   Musculoskeletal: Positive for neck pain.   Psychiatric/Behavioral: The patient has insomnia.         O:  BP 110/72 mmHg  Pulse 73  Temp(Src) 97.6 F (36.4 C) (Temporal)  Wt 53.241 kg (117 lb 6 oz)  SpO2 97%     Physical Exam   Constitutional: Appears well-developed and well-nourished.   HENT:   Head: Normocephalic and atraumatic.   Right Ear: External ear normal.   Left Ear: External ear normal.   Nose: Nose normal.   Eyes: Conjunctivae and EOM are normal.   Skin: Skin is warm and dry.   Neck: Inspection of cervical spine reveals no ecchymosis, erythema, or bony abnormality. AROM (flexion/extension, lateral bending) not limited to pain. Palpation of spinous processes reveals no pain. Palpation of paraspinal muscles  reveals no pain. Strength 5/5. Sensation intact. Reflexes 2+.     A/P    1. Neck pain  2. Poor sleep  3. Stress  Comment: pain has since resolved. Could have been spasm. Suspect poor sleep and stress contributing.  Plan:  - heat 30 min 3-4 times daily followed by gentle stretching (print outs given)  - ibuprofen prn pain  - reviewed healthy sleep hygiene  - basic relaxation techniques reviewed  - rtc if symptoms worsen or do not improve  .  I have spent 15 minutes in face to face time with this patient/patient proxy of which > 50% was in counseling or coordination of care regarding above issues/Dx.      Dominica Severin, PA-C

## 2014-11-01 ENCOUNTER — Ambulatory Visit (HOSPITAL_BASED_OUTPATIENT_CLINIC_OR_DEPARTMENT_OTHER): Payer: Self-pay

## 2014-11-01 NOTE — Dental Note (Signed)
Patient presented for a periodic exam.    Patient pain level at beginning of appointment: 0    Interpreter: None    *Medical/Jeananne Bedwell History*  Chief complaint: none  History of present illness: n/a  Pertinent medical alerts: None  Medical history: No change    *Periodic Exam*  Extraoral exam: WNL (normal and symmetrical)  Intraoral exam: WNL (good hygiene, no masses, ulcerations, or discoloration)  Occlusion classification: Class I  Periodontal probing: Complete periodontal charting  Periodontal exam: Generalized normal tissue, pink, knife-edge, filled  embrasure, firm, stippled  Radiographs taken: 4 BWX  Radiographs taken to evaluate for: Eval for caries and/or pathology  Radiographic exam: WNL (no caries, periapical lesions, or other findings)    *Assessment*  Prescription given: None  Referral: Not given  Assessment/Plan: TX PLAN:   operative-due to failing margins on existing restorations  prophy    Patient pain level at end of appointment: 0    Patient left ambulatory, satisfied with treatment. Any remaining planned  treatments were explained in detail to the patient.    Assistant: Verdie Mosher    Next visit: What:operative;f/u prophy  Reason:failing margins    ----- Signed on Wednesday, November 01, 2014 at 11:36:35 AM  -----  ----- Provider: Barry Dienes, DMD -- Clinic: [facility] -----

## 2014-11-01 NOTE — Dental Note (Signed)
Patient presented for a hygiene visit.    Interpreter: None    Patient pain level at beginning of appointment: 1    *Medical/Brianna [name] History*  Medical history: No change    *Radiographs*  Radiographs taken: None taken  Radiographs taken to evaluate for:    *Hygiene Observations*  - Overall Oral Hygiene:Fair  - Plaque: Medium  - Staining: Medium  - Calculus: Medium  - Food Impaction: Light  - Bleeding: Medium  - Gingival Attachment: Recession  - Periodontal description: Generalized normal tissue, pink, knife-edge,  filled embrasure, firm, stippled  - Periodontal probing: N/A  - Perio classification: Skipped  -Home Care: Fair  -Intraoral exam:    *Treatment*  Services provided: Prophy and Oral hygiene Instruction.  Prophylaxis today consisted of hand scale, prophy cup polish, Cavitron and  flossing. Proper technique of brushing demonstrated.  Flossing with fingers   and   a floss holder demonstrated.  Patient given a soft toothbrush and floss.    *Assessment & Plan*  Patient was not examined by dentist.  Assessment/Plan: Need to improve home care    Patient pain level at end of appointment: 0    Patient left ambulatory, satisfied with treatment. Any remaining planned  treatments were explained in detail to the patient.    Hygiene Recare 6 month interval    Next Visit:  *Instructions for Receptionist*  Procedure:  Prophy  Provider: Hygienist  When:    ----- Signed on Wednesday, November 01, 2014 at 5:20:34 PM  -----  ----- Provider: Roselee Culver, RDH -- Clinic: [facility] -----

## 2014-11-01 NOTE — Dental Note (Unsigned)
*  Instructions for Receptionist*  What Procedure?1) operative 2) prophy  Which Provider?Dr. Bridgett Larsson, Pam[facility] Hurry  How Soon?next avail  Reason?caries, prophy    Medical Alert: Allergies    Mental Disorders    Other    Vitamin Deficiency  Medications:  Allergies:      Penicillin  Since Last Visit: Medical Alert: No Change    Medications: No Change    Allergies:        No Change  Pain Scale Type: Numeric Pain Scale Pain Level: 0  Description:

## 2014-11-03 ENCOUNTER — Ambulatory Visit (HOSPITAL_BASED_OUTPATIENT_CLINIC_OR_DEPARTMENT_OTHER): Payer: MEDICARE | Admitting: Internal Medicine

## 2014-11-03 VITALS — BP 90/58 | HR 68 | Temp 97.9°F | Wt 118.0 lb

## 2014-11-03 DIAGNOSIS — Z7189 Other specified counseling: Secondary | ICD-10-CM

## 2014-11-03 DIAGNOSIS — F4310 Post-traumatic stress disorder, unspecified: Secondary | ICD-10-CM

## 2014-11-03 DIAGNOSIS — K3189 Other diseases of stomach and duodenum: Secondary | ICD-10-CM

## 2014-11-03 DIAGNOSIS — R1013 Epigastric pain: Secondary | ICD-10-CM

## 2014-11-03 DIAGNOSIS — R5383 Other fatigue: Secondary | ICD-10-CM

## 2014-11-03 DIAGNOSIS — R5381 Other malaise: Secondary | ICD-10-CM

## 2014-11-03 DIAGNOSIS — Z7182 Exercise counseling: Secondary | ICD-10-CM

## 2014-11-03 DIAGNOSIS — Z719 Counseling, unspecified: Secondary | ICD-10-CM

## 2014-11-03 MED ORDER — PROPRANOLOL HCL 10 MG PO TABS
10.0000 mg | ORAL_TABLET | Freq: Three times a day (TID) | ORAL | Status: DC | PRN
Start: 2014-11-03 — End: 2015-04-11

## 2014-11-03 NOTE — Progress Notes (Signed)
[name] is a 59 year old patient  Here today re:   1. More fatigue - together with more hyperadrenergic symptoms.  But maybe this is a little better now - possibly some seasonal affective component.  Details:  Doing [employer] acupuncture - helpful.  Seems to help with both nerves and digestion as well.  2. Reading book by Sharyn Dross about ptsd - ?maybe beta-blocker helps w flashbacks.  Propranolol?  3. Fatigue continued. ?cortisol levels?  Have we checked?  We have checked twice -   4. Interviewing for a trauma group for VOV.  Maybe needs referral.  3/30 app't [facility] Sh[facility].  5. Wants to start exercising - told "check w yr doctor first".  Reports no exertional sx's at all, no chest pressure, dyspnea, dizziness, palpitations.     ROS otherwise negative for psychiatric, cardiac, pulmonary, gastrointestinal, genitourinary, musculoskeletal or neurological symptoms.    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  [place] in [place].  Lives alone.  Single since '05.  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         Smoking Status: Never Smoker                      Alcohol Use: No                 Current Outpatient Prescriptions:  propranolol (INDERAL) 10 MG tablet Take 1 tablet by mouth 3 (three) times daily as needed. For rapid heart rate or anxiety symptoms. Disp: 30 tablet Rfl: 0   THEANINE PO Take  by mouth. Disp:  Rfl:    Multiple Vitamin (MULTIVITAMINS PO)  Disp:  Rfl:      No current facility-administered  medications for this visit.  Reports no difficulties with meds, co-pays or insurance  Payor: MEDICARE / Plan: MEDICARE / Product Type: *No Product type* /   Allergy History: Penicillins    Health maintenance: due for: ZOSTER VACCINE,AGE 54 AND OLDER (ONCE) due on 07/03/2014      PHYSICAL EXAMINATION:  BP 90/58 mmHg  Pulse 68  Temp(Src) 97.9 F (36.6 C) (Temporal)  Wt 53.524 kg (118 lb)  SpO2 99%   Comfortable-appearing; alert, ambulatory, attentive.  Communication unimpaired.   Affect appropriate to situation.    Cognitive status intact to history.  HEENT: eyes anicteric conjunctivae normal  Chest clear to auscultation  Cor regular rate and rhythm, nl S1 and S2, no m/r/g.  Extremities warm well-perfused, no edema; pulses normal / full / symmetric.  Skin unremarkable, warm and dry, no rash    Most Recent Weight Reading(s)  11/03/14 : 53.524 kg (118 lb)  08/18/14 : 53.241 kg (117 lb 6 oz)  05/30/14 : 53.524 kg (118 lb)      ASSESSMENT/PLAN:  1. Posttraumatic stress disorder  Discussed.  Referred.  VOV group  intake.  -trial:  propranolol (INDERAL) 10 MG tablet; Take 1 tablet by mouth 3 (three) times daily as needed. For rapid heart rate or anxiety symptoms.  Dispense: 30 tablet; Refill: 0  Reviewed commonest adverse effects, including dizziness and bradycardia and fatigue. Some adverse effects are transient.  For persistent or troubling adverse effects, she understands to discontinue medication and call that same day, to let me know. She reports that she understands and agrees.   - REFERRAL TO ADULT PSYCHIATRY ( INT)    2. Other fatigue  rvwd ddx of; rvwd connection btwn cortisol, stress hormones, adrenals, and fatigue and depression.  All questions answered, she has no further questions at this time.     3. Dyspepsia  Acupuncture helping.  Continue treatment.     4. Exercise counseling  Reassured re: no signs cardiovascular dz and can increase gradually as tolerated.    Counselling-oriented visit, 25 minutes  spent face-to-face with patient, of which greater than 50% was direct counselling; topics covered included discussing diagnoses, reviewing typical symptoms and  approaches to workup, and options for management/treatment.    We discussed her med(s) and the importance of medication compliance. The patient was ready to learn and no apparent learning barriers were identified. I explained the diagnosis and treatment plan, and the patient expressed understanding of the content. Possible side effects of the prescribed medication(s) were explained.  I attempted to answer any questions regarding the diagnosis and the proposed treatment.      1. The patient indicates understanding of these issues and agrees with the plan.  2. Past medical history, medications, problem list, allergies, family history, health habits, social history: reviewed, updated, as noted in EPIC.   3. I reconciled the patient's medication list and prepared and supplied needed refills.    Electronically signed by: Blima Singer MD, 11/03/2014 8:20 AM

## 2014-11-08 ENCOUNTER — Ambulatory Visit (HOSPITAL_BASED_OUTPATIENT_CLINIC_OR_DEPARTMENT_OTHER): Payer: MEDICARE | Admitting: Clinical

## 2014-11-08 DIAGNOSIS — F449 Dissociative and conversion disorder, unspecified: Principal | ICD-10-CM

## 2014-11-08 NOTE — Progress Notes (Signed)
Marland KitchenGROUP THERAPY SCREENING EVALUATION    Referred by: self/therapist/vov group coordinator      Group referred for: trauma and the body                                                              Current treaters:   Individual therapist: Dr. Gennette Pac                      Contact number: [phone]  Release of information signed., private practice therapist     Also working with somatic experiencing therapist   Psychopharmacologist:                         Contact number:        Primary care clinician:      Loleta Chance number:           SUBJECTIVE INFORMATION:  Presenting problem(s): reports walking around tense, in terror state all the time. 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      Patient's goals for group: exploring appropriate goal perhaps noticing antecedents to flashbacks and using somatic resources to stay in the present, or working with tension of terror state that she carries around.  Plans to consult and try to find an appropriate concrete goal for group is she decides to join.         Patient's concerns related to group: concern that she can quickly and without warning become overcome by flashbacks and regress into infantile state.         Previous group experience: none reported         OBJECTIVE INFORMATION:  Mental status: articulate intelligent woman, no self harm, addiction or evidence of psychosis desribes self as a slow processor.  Unclear if  She will be able to use sensori motor techniques and mindful somatic self awareness as practiced in this group.   Not especially in touch with her body, and does not practice any physical exercise/yoga etc.       Risk issues: none noted          Insurance mandates:     None    ASSESSMENT:  Formulation: 59  y.o. S. Female living in roommate situation.  Reports no social connections.  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  She is currently taking a class at [employer]. Reports good alliance with private therapist, much of treatment focused on working through transference/countertranseference issues.  Also in somatic experiencing adjunctive treatment.    Reports experiences most of her symptoms highly somatically.          Primary diagnosis related to group referral: depression, ptsd         Interventions today:    Orientation to group treatment   Exploration of  patient's goals for group treatment   Consents to contact outside providers          PLAN: patient to discuss group with therapist, this clinician left voice mail with therapist.  Haidynn to decide if group is a Orthoptist by [employer]. Needs to come up with concrete goal.                                        Vonita Moss, LICSW                      Date: November 07, 2014

## 2014-12-04 ENCOUNTER — Encounter (HOSPITAL_BASED_OUTPATIENT_CLINIC_OR_DEPARTMENT_OTHER): Payer: Self-pay | Admitting: Physician Assistant

## 2014-12-04 ENCOUNTER — Ambulatory Visit (HOSPITAL_BASED_OUTPATIENT_CLINIC_OR_DEPARTMENT_OTHER): Payer: MEDICARE | Admitting: Physician Assistant

## 2014-12-04 VITALS — BP 100/60 | Temp 98.2°F | Wt 117.0 lb

## 2014-12-04 DIAGNOSIS — S90822A Blister (nonthermal), left foot, initial encounter: Principal | ICD-10-CM

## 2014-12-04 DIAGNOSIS — L989 Disorder of the skin and subcutaneous tissue, unspecified: Secondary | ICD-10-CM

## 2014-12-04 DIAGNOSIS — S90829A Blister (nonthermal), unspecified foot, initial encounter: Secondary | ICD-10-CM

## 2014-12-04 DIAGNOSIS — R238 Other skin changes: Secondary | ICD-10-CM

## 2014-12-04 NOTE — Progress Notes (Signed)
S:    This 59 year old female comes to [facility] for:    1. Blister on foot-- Walked with a stone in shoe 3-4 weeks ago. Seemed like it was healing. She noticed  On Friday it was bothering her more. Today it is a little better. No redness, swelling or fevers reported. No numbness/tingling.    2. Left ear skin issue--wants some one to look at it. She can tell somethig is there. Has no idea how long it has been there. No personal hx of skin ca.    Patient Active Problem List:     POSTTRAUMATIC STRESS DISORDER     FATIGUE GENERAL     FAMILY HX-BREAST MALIG     Screening for unspecified condition     Dissociative disorder or reaction, unspecified     Major Depressive Disorder, Recurrent Episode, Moderate     Bunion     Menopause     Vitamin D Deficiency     High myopia, both eyes     Pseudophakia     History of osteopenia    Review of Patient's Allergies indicates:   Penicillins             Hives    Comment:childhood.       Review of Systems   Constitutional: Negative for fever, chills and weight loss.   Skin:        See subjective     O:  BP 100/60 mmHg  Temp(Src) 98.2 F (36.8 C) (Temporal)  Wt 53.071 kg (117 lb)        Physical Exam   Constitutional: Appears well-developed and well-nourished.   HENT:   Head: Normocephalic and atraumatic.   Right Ear: External ear normal.   Left Ear: Behind left ear, on crease between ear and head, there is skin flaking. There is also a small, smooth, skin colored papule noted. It is not scaly, hyperpigmented or abnormal in shape.  Nose: Nose normal.   Eyes: Conjunctivae and EOM are normal.   Neck: Normal range of motion. Neck supple.   Skin: Skin is warm and dry.   Feet: Inspection of feet reveals flaking skin consistent with healing blister on the heel of her foot. There is no tenderness, swelling, induration or fluctuation noted. No FB appreciated. There is no surrounding redness. Sensation normal. Pulses 2+. Reflexes 2+.     A/P    1. Blister of foot, left, initial  encounter  Comment: healing appropriately. No signs of FB or infection.  Plan:  - pt reassured    2. Papule of skin  Comment: benign appearing. Region is hidden from hair and low risk for skin CA.  Plan:  - monitor for now--refer to derm if it changes in size or shape    I have spent 15 minutes in face to face time with this patient/patient proxy of which > 50% was in counseling or coordination of care regarding above issues/Dx.      Dominica Severin, PA-C

## 2014-12-20 ENCOUNTER — Ambulatory Visit (HOSPITAL_BASED_OUTPATIENT_CLINIC_OR_DEPARTMENT_OTHER): Payer: Self-pay

## 2014-12-20 NOTE — Dental Note (Unsigned)
Medical Alert: Allergies    Mental Disorders    Other    Vitamin Deficiency  Medications:  Allergies:      Penicillin  Since Last Visit: Medical Alert: No Change    Medications: No Change    Allergies:        No Change  Pain Scale Type: Numeric Pain Scale Pain Level: 0  Description:

## 2014-12-20 NOTE — Dental Note (Unsigned)
*  Instructions for Receptionist*  What Procedure?operative  Which Provider?Dr. Bridgett Larsson  How Soon?next avail  Reason?failing margin

## 2014-12-20 NOTE — Dental Note (Signed)
Patient presented for an amalgam restoration #18-DO, 19-MODL.    Patient pain level at beginning of appointment: 0    Interpreter: None    *Medical/Beau Vanduzer History*  Chief complaint: none  HPI: n/a  Medical history reviewed and updated.    *Amalgam Restoration*  Reason(s) for restoration(s): failing margins  Radiographs taken: None taken  Radiographs taken to evaluate for: N/A  Local anesthetic used: 2% Lidocaine (1:100,000 epinephrine)  Whole number of carpules used: 1  Type of injection: Inferior alveloar nerve block  Existing material removed: Amalgam  Liner and base placed: None    The tooth was prepared with adequate water irrigation. GS-80 fast set amalgam  was placed, condensed, and burnished. Restoration was carved to ideal  anatomy. Margins, contacts, and occlusion were checked and adjusted as  needed. Post-op instructions given. Patient instructed not to bite on this   side   of the mouth for at least 24 hours.    *Assessment*  Prescription given: None  Referral: Not given      Patient pain level at end of appointment: 0    Patient left ambulatory, satisfied with treatment. Any remaining planned  treatments were explained in detail to the patient.    Assistant: Verdie Mosher    Next visit: What:UL quad restorations  Reason:failing margins    ----- Signed on Wednesday, Dec 20, 2014 at 2:02:57 PM  -----  ----- Provider: Oran Rein,  -- Clinic: [facility] -----  ----- Override Provider: Barry Dienes, DMD -----

## 2015-01-12 ENCOUNTER — Ambulatory Visit (HOSPITAL_BASED_OUTPATIENT_CLINIC_OR_DEPARTMENT_OTHER): Payer: MEDICARE | Admitting: Internal Medicine

## 2015-01-12 ENCOUNTER — Encounter (HOSPITAL_BASED_OUTPATIENT_CLINIC_OR_DEPARTMENT_OTHER): Payer: Self-pay | Admitting: Internal Medicine

## 2015-01-12 VITALS — BP 110/64 | Wt 117.0 lb

## 2015-01-12 DIAGNOSIS — M79672 Pain in left foot: Secondary | ICD-10-CM

## 2015-01-12 DIAGNOSIS — M79609 Pain in unspecified limb: Secondary | ICD-10-CM

## 2015-01-12 DIAGNOSIS — Z23 Encounter for immunization: Secondary | ICD-10-CM

## 2015-01-12 DIAGNOSIS — M79671 Pain in right foot: Secondary | ICD-10-CM

## 2015-01-12 NOTE — Patient Instructions (Addendum)
Plantar Fasciitis  Plantar fasciitis is a common condition that causes foot pain. It is soreness (inflammation) of the band of tough fibrous tissue on the bottom of the foot that runs from the heel bone (calcaneus) to the ball of the foot. The cause of this soreness may be from excessive standing, poor fitting shoes, running on hard surfaces, being overweight, having an abnormal walk, or overuse (this is common in runners) of the painful foot or feet. It is also common in aerobic exercise dancers and ballet dancers.  SYMPTOMS   Most people with plantar fasciitis complain of:   Severe pain in the morning on the bottom of their foot especially when taking the first steps out of bed. This pain recedes after a few minutes of walking.   Severe pain is experienced also during walking following a long period of inactivity.   Pain is worse when walking barefoot or up stairs  DIAGNOSIS    Your caregiver will diagnose this condition by examining and feeling your foot.   Special tests such as X-rays of your foot, are usually not needed.  PREVENTION    Consult a sports medicine professional before beginning a new exercise program.   Walking programs offer a good workout. With walking there is a lower chance of overuse injuries common to runners. There is less impact and less jarring of the joints.   Begin all new exercise programs slowly. If problems or pain develop, decrease the amount of time or distance until you are at a comfortable level.   Wear good shoes and replace them regularly.   Stretch your foot and the heel cords at the back of the ankle (Achilles tendon) both before and after exercise.   Run or exercise on even surfaces that are not hard. For example, asphalt is better than pavement.   Do not run barefoot on hard surfaces.   If using a treadmill, vary the incline.   Do not continue to workout if you have foot or joint problems. Seek professional help if they do not improve.  HOME CARE INSTRUCTIONS       Avoid activities that cause you pain until you recover.   Use ice or cold packs on the problem or painful areas after working out.   Only take over-the-counter or prescription medicines for pain, discomfort, or fever as directed by your caregiver.   Soft shoe inserts or athletic shoes with air or gel sole cushions may be helpful.   If problems continue or become more severe, consult a sports medicine caregiver or your own health care provider. Cortisone is a potent anti-inflammatory medication that may be injected into the painful area. You can discuss this treatment with your caregiver.  MAKE SURE YOU:    Understand these instructions.   Will watch your condition.   Will get help right away if you are not doing well or get worse.  Document Released: 04/22/2001 Document Revised: 10/20/2011 Document Reviewed: 06/21/2008  [facility] Patient Information 2015 [place], [place]. This information is not intended to replace advice given to you by your health care provider. Make sure you discuss any questions you have with your health care provider.         Index   Varicella-Zoster Vaccine   var-ih-SELL-a-ZOS-ter vak-SEEN  What are other names for this medicine?   Type of medicine: vaccine  Generic and brand names: varicella-zoster vaccine, injection; zoster vaccine live, injection; Zostavax  What is this medicine used for?  This medicine is a vaccine  given by injection (a shot) to provide protection against herpes zoster (shingles). It is given to people over the age of 77, even if you have had shingles before.  What should my healthcare provider know before I take this medicine?   Before taking this medicine, tell your healthcare provider if you have ever had:   an allergic reaction to any medicine    a blood disorder, leukemia, lymphoma, or any cancer affecting your bone marrow or lymph system, or if you receive blood transfusions or immune globulin    a weakened immune system from diseases such as HIV or  cancer or from taking immunosuppressant medicines to prevent organ transplant rejection or steroid medicines    tuberculosis   If you are severely ill at the time the shot is scheduled, wait until you recover before getting this vaccine. If you have a mild cold or mild upper respiratory infection with or without fever, you may still be able to get your shot. Talk to your healthcare provider about this.  Females of childbearing age: Talk with your healthcare provider if you are pregnant or plan to become pregnant. It is not known whether this medicine will harm an unborn baby. Do not become pregnant for at least 3 months after receiving this medicine. Do not breast-feed while taking this medicine without your healthcare provider's approval.  How do I use it?   This medicine is given as a single shot by a healthcare provider.  What should I watch out for?   This medicine may cause pain, tenderness, irritation, rash, or swelling on the skin where injections were given. The pain or tenderness should go away in a day or two.  Blood transfusions or immune globulin medicine can change the way this vaccine works. Talk with your healthcare provider about this.  After you receive this vaccine, you may be able to pass the virus to other people. If you get a rash after you receive this vaccine, avoid close contact with pregnant women, newborn babies, and people whose bodies cannot fight infection (such as those with bone marrow disease, HIV, or people having cancer treatments). Talk with your healthcare provider about this.  If you need emergency care, surgery, or dental work, tell the healthcare provider or dentist you have received this medicine.  What are the possible side effects?   Along with its needed effects, your medicine may cause some unwanted side effects. Some side effects may be very serious. Some side effects may go away as your body adjusts to the medicine. Tell your healthcare provider if you have any side  effects that continue or get worse.  Life-threatening (Report these to your healthcare provider right away. If you cannot reach your healthcare provider right away, get emergency medical care or call 911 for help): Allergic reaction (hives; itching; rash; trouble breathing; tightness in your chest; swelling of your lips, tongue, and throat).  Serious (report these to your healthcare provider right away): High fever that continues or causes convulsions.  Other: Skin irritation or pain where injection is given, mild rash, cough, diarrhea, headache.  What products might interact with this medicine?   When you take this medicine with other medicines, it can change the way this or any of the other medicines work. Nonprescription medicines, vitamins, natural remedies, and certain foods may also interact. Using these products together might cause harmful side effects. Talk to your healthcare provider if you are taking:   corticosteroids such as cortisone, dexamethasone, fludrocortisone (Florinef), hydrocortisone (  Cortef), methylprednisolone (Medrol), prednisolone (Prelone), prednisone, and triamcinolone (Aristocort)    immune globulin    immunosuppressants such as azathioprine (Imuran), basiliximab (Simulect), cyclosporine (Sandimmune, Neoral), glatiramer (Copaxone), mycophenolate (CellCept), sirolimus (Rapamune), and tacrolimus (Prograf)    medicines used to treat cancer such as cisplatin, doxorubicin (Adriamycin), hydroxyurea (Hydrea), vinblastine, and vincristine (Vincasar)    methotrexate (Rheumatrex Dose Pack)    other vaccines. Tell your healthcare provider about any other immunizations you have had or are scheduled to receive. It is better to get this vaccine at least 4 weeks before or after you get a pneumonia vaccine.    radiation therapy   Keep a record of all vaccines received and when you received them.  If you are not sure if your medicines might interact, ask your pharmacist or healthcare provider.  Keep a list of all your medicines with you. List all the prescription medicines, nonprescription medicines, supplements, natural remedies, and vitamins that you take. Be sure that you tell all healthcare providers who treat you about all the products you are taking.     This advisory includes selected information only and may not include all side effects of this medicine or interactions with other medicines. Ask your healthcare provider or pharmacist for more information or if you have any questions.  Keep all medicines out of the reach of children.   Do not share medicines with other people.   Developed by [employer].  Medication Advisor 2012.1 published by [employer].  Last modified: 2010-06-28  Last reviewed: 2009-11-16  This content is reviewed periodically and is subject to change as new health information becomes available. The information is intended to inform and educate and is not a replacement for medical evaluation, advice, diagnosis or treatment by a healthcare professional.  References   Medication Advisor 2012.1 Index    2012 [employer] and/or its affiliates. All rights reserved.

## 2015-01-12 NOTE — Progress Notes (Signed)
Brianna [name] is a 58 year old female    Patient presents with:  Foot Pain: right heel pain       Patient Active Problem List:     Posttraumatic stress disorder     FATIGUE GENERAL     Family history of malignant neoplasm of breast     Screening for unspecified condition     Dissociative disorder or reaction, unspecified     Major depressive disorder, recurrent episode, moderate     Bunion     Menopause     Vitamin D deficiency     High myopia, both eyes     Pseudophakia     History of osteopenia      Started couple weeks ago with right heel area.  Has had a little before then but very minimal. Has had on both sides minimally.  Now more intense on right side.   Walking a bit more as the weather is nicer.  Throbbing at nights.  Walking makes it worse.  Touching the area hurts more as well.   No erythema or swelling.  Worse at nights.      No trauma.  Takes no medications for this.             Physical Exam   Constitutional: She appears well-developed. No distress.   BP 110/64  Wt 53.1 kg (117 lb)  BMI 20.73 kg/m2     Musculoskeletal:   no edema, erythema or deformities noted on both feet.    While dorsiflexing toes on right, mild tenderness to palpation along the fascia on the heel area.            ASSESSMENT/PLAN:  (M79.671,  M79.672) Heel pain, bilateral  (primary encounter diagnosis)  Comment: likely plantar fascitis.  Discussed heel spur also possible. Recommend trial of stretching, ice to area, shoe inserts with more support, and acetaminophen prn.  Return prn.     (Z23) Vaccine for VZV (varicella-zoster virus)  Comment: discussed but patient prefers to discuss with PCP at next visit for CPE.        We discussed the patient's current medications. The patient expressed understanding and no barriers to adherence were identified.

## 2015-03-27 ENCOUNTER — Ambulatory Visit (HOSPITAL_BASED_OUTPATIENT_CLINIC_OR_DEPARTMENT_OTHER): Payer: Self-pay

## 2015-03-27 NOTE — Dental Note (Signed)
Patient presented for a limited exam/consult.    Patient pain level at beginning of appointment: 0    Interpreter: None    *Medical/Brianna [name] History*  Chief complaint: Pt. would like to review tx plan to determine if she needs   the   rest of her restorations to be done.  Pt. has also had issues of flossing  between 18 and 19 since the restorations were done on those teeth.  History of present illness: since last visit  Medical history reviewed and updated.    *Exam*  Pertinent findings: Provider was able to floss easily between 18 and 19 and  contact was good.  Clinically checked teeth that were tx planned. confirmed  that there were primary caries and for existing restorations there were   failing   margins  Radiographs taken: None taken  Radiographs taken to evaluate for: N/A      *Assessment*  Prescription given: None  Referral: Not given  Assessment/Plan: Pt. was given a sample of [employer] Brianna [name] floss to floss with.   The floss that she currently uses may have shredding issues.  Explain to pt  the risks of not having restorations done since there are primary caries and  failing margins.  Pt. would like to think about it.    Patient pain level at end of appointment: 0    Patient left ambulatory, satisfied with treatment. Any remaining planned  treatments were explained in detail to the patient.    Assistant: Verdie Mosher    Next visit: What:14-MOD Ag (only do one restoration at a time)  Reason:failing margins    ----- Signed on Tuesday, March 27, 2015 at 2:37:02 PM  -----  ----- Provider: Oran Rein,  -- Clinic: [facility] -----  ----- Override Provider: Barry Dienes, DMD -----

## 2015-03-27 NOTE — Dental Note (Unsigned)
Medical Alert: Allergies    Mental Disorders    Other    Vitamin Deficiency  Medications:  Allergies:      Penicillin  Since Last Visit: Medical Alert: No Change    Medications: No Change    Allergies:        No Change  Pain Scale Type: Numeric Pain Scale Pain Level: 0  Description:

## 2015-03-30 ENCOUNTER — Ambulatory Visit (HOSPITAL_BASED_OUTPATIENT_CLINIC_OR_DEPARTMENT_OTHER): Payer: Self-pay

## 2015-03-30 NOTE — Dental Note (Signed)
Patient presented for an amalgam restoration #14 and 15, MOD.    Patient pain level at beginning of appointment: 0    Interpreter: None    *Medical/Loeta Herst History*  Chief complaint:  HPI:  Medical history reviewed and updated.    *Amalgam Restoration*  Reason(s) for restoration(s): Fractured restoration  Radiographs taken: None taken  Radiographs taken to evaluate for:  Local anesthetic used: 4% Articaine (1:100,000 epinephrine) and 2% Lidocaine  (1:100,000 epinephrine)  Whole number of carpules used: 2  Type of injection: Maxillary infiltration and Anterior superior alveolar  nerve block  Existing material removed: Amalgam  Liner and base placed: Lime-Lite    The tooth was prepared with adequate water irrigation. GS-80 fast set amalgam  was placed, condensed, and burnished. Restoration was carved to ideal  anatomy. Margins, contacts, and occlusion were checked and adjusted as  needed. Post-op instructions given. Patient instructed not to bite on this   side   of the mouth for at least 24 hours.    *Assessment*  Prescription given: None  Referral: Not given  Assessment/Plan: dificulty keeping mouth open=used a mouth prop and worked   well      Patient pain level at end of appointment: 0    Patient left ambulatory, satisfied with treatment. Any remaining planned  treatments were explained in detail to the patient.    Assistant: Daine Gip    Next visit: What: fill lower right amal and comp  Reason:    ----- Signed on Friday, March 30, 2015 at 3:48:28 PM  -----  ----- Provider: Irena Reichmann,  -- Clinic: [facility] -----  ----- Override Provider: Roney Jaffe, DDS -----

## 2015-04-06 ENCOUNTER — Telehealth (HOSPITAL_BASED_OUTPATIENT_CLINIC_OR_DEPARTMENT_OTHER): Payer: Self-pay | Admitting: Psychologist

## 2015-04-06 NOTE — Progress Notes (Signed)
Pt called me to ask about the [facility] and whether this program would be a good match for her. We discussed the pros and cons and mutually decided this program would not meet her needs. Pt explained she and her therapist (private practice therapist who accepts her insurance) mutually agreed they have done what work they can do together. 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 She is looking for a therapist who can be honest about his/her own emotional reaction when caught in this enactment, feeling that working this pattern through will be very therapeutic and helpful for her. I thought of a therapist at [facility] who may be good match for pt. Pt and I agreed to meet on Wed Aug 31 to discuss her options in more detail.

## 2015-04-11 ENCOUNTER — Encounter (HOSPITAL_BASED_OUTPATIENT_CLINIC_OR_DEPARTMENT_OTHER): Payer: Self-pay | Admitting: Internal Medicine

## 2015-04-11 ENCOUNTER — Ambulatory Visit (HOSPITAL_BASED_OUTPATIENT_CLINIC_OR_DEPARTMENT_OTHER): Payer: MEDICARE | Admitting: Psychologist

## 2015-04-11 ENCOUNTER — Ambulatory Visit (HOSPITAL_BASED_OUTPATIENT_CLINIC_OR_DEPARTMENT_OTHER): Payer: MEDICARE | Admitting: Internal Medicine

## 2015-04-11 VITALS — BP 104/70 | HR 87 | Temp 98.9°F | Wt 115.0 lb

## 2015-04-11 DIAGNOSIS — F418 Other specified anxiety disorders: Secondary | ICD-10-CM

## 2015-04-11 DIAGNOSIS — F331 Major depressive disorder, recurrent, moderate: Secondary | ICD-10-CM

## 2015-04-11 DIAGNOSIS — F4310 Post-traumatic stress disorder, unspecified: Secondary | ICD-10-CM

## 2015-04-11 MED ORDER — PROPRANOLOL HCL 10 MG PO TABS
10.0000 mg | ORAL_TABLET | Freq: Three times a day (TID) | ORAL | 1 refills | Status: DC | PRN
Start: 2015-04-11 — End: 2015-06-13
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
## 2015-04-11 NOTE — Progress Notes (Signed)
Brianna [name] is a 59 year old patient  Here today re:   Anxiety.  ?go on a ssri to reduce anxiety?  Propranolol: Was taking just if eg public speaking  But now feels anxious daily.  "anxiety paralyzes me"  Harder to do household tasks, organize things  Maybe related to: having more ptsd flashbacks lately; also, changing therapists.  Contacting people is harder to do - initiating contact.  And palpitation feelings are pretty much daily - not all day -  But sx's seem to come out of nowhere  And even when doing nothing particular - can get palpitations.  Sleep: maybe over-sleeping - b/c sometimes that is "the only thing I can think to do"    Therapy: sees Gennette Pac in [place] - no longer helpful.  Ready to terminate.  [name] is finding a therapist for her via [place].    No hx mania. No SI/HI.      ROS otherwise negative for psychiatric, cardiac, pulmonary, gastrointestinal, genitourinary, musculoskeletal or neurological symptoms.    Past medical history:  Patient Active Problem List:     Posttraumatic stress disorder     FATIGUE GENERAL     Family history of malignant neoplasm of breast     Screening for unspecified condition     Dissociative disorder or reaction, unspecified     Major depressive disorder, recurrent episode, moderate     Bunion     Menopause     Vitamin D deficiency     High myopia, both eyes     Pseudophakia     History of osteopenia     Social History Narrative    Former Education officer, environmental for [employer]; moved to [place] area 11/02.  Previously in [place].  Lives alone.  Single since '05.  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       Smoking status: Never Smoker                                                              Alcohol use: No                 Current Outpatient Prescriptions:  propranolol (INDERAL) 10 MG tablet Take 1 tablet by mouth 3 (three) times daily as needed. For rapid heart rate or anxiety symptoms. Disp: 30 tablet Rfl: 0    THEANINE PO Take  by mouth. Disp:  Rfl:    Multiple Vitamin (MULTIVITAMINS PO)  Disp:  Rfl:      No current facility-administered medications for this visit.   Reports no difficulties with meds, co-pays or insurance  Payor: MEDICARE / Plan: MEDICARE / Product Type: *No Product type* /   Allergy History: Penicillins    Health maintenance: due for: ZOSTER VACCINE,AGE 43 AND OLDER (ONCE) due on 07/03/2014  PHYSICAL EXAM (AGE 8+) due on 05/31/2015      PHYSICAL EXAMINATION:  BP 104/70  Pulse 87  Temp 98.9 F (37.2 C) (Temporal)  Wt 52.2 kg (115 lb)  SpO2 96%  BMI 20.37 kg/m2   Comfortable-appearing; alert, ambulatory, attentive.  Moves easily; communication unimpaired.   Affect sometimes sad, per baseline, but w full affective range.    Cognitive status intact to history.  HEENT:   Eyes anicteric  conjunctivae normal  Voice normal  Neck supple  Chest clear to auscultation  Cor regular rate and rhythm, nl S1 and S2.  Abdomen soft, nontender nondistended   Extremities warm well-perfused, no edema  Skin unremarkable, warm and dry, no rash    Most Recent Weight Reading(s)  04/11/15 : 52.2 kg (115 lb)  01/12/15 : 53.1 kg (117 lb)  12/04/14 : 53.1 kg (117 lb)    ASSESSMENT/PLAN:  1. Anxiety associated with depression/ 2. Posttraumatic stress disorder  Discussed possibly starting citalopram 10 mg for chronic anxiety sx's w some depressive sx's. Opts to try taking propranolol BID (not prn) and see if this alleviates sx's. If not, will contact me re: trial SSRI.  - propranolol (INDERAL) 10 MG tablet; Take 1 tablet by mouth 3 (three) times daily as needed For rapid heart rate or anxiety symptoms.  Dispense: 60 tablet; Refill: 1      Counselling-oriented visit, 25 minutes spent face-to-face with patient, of which greater than 50% was direct counselling; topics covered included discussing diagnoses, reviewing typical symptoms and  approaches to workup, and options for management/treatment.      We discussed her med(s) and the  importance of medication compliance. The patient was ready to learn and no apparent learning barriers were identified. I explained the diagnosis and treatment plan, and the patient expressed understanding of the content. Possible side effects of the prescribed medication(s) were explained.  I attempted to answer any questions regarding the diagnosis and the proposed treatment.      1. The patient indicates understanding of these issues and agrees with the plan.  2. Past medical history, medications, problem list, allergies, family history, health habits, social history: reviewed, updated, as noted in EPIC.   3. I reconciled the patient's medication list and prepared and supplied needed refills.    Electronically signed by: Blima Singer MD, 04/11/2015 11:53 AM

## 2015-04-16 ENCOUNTER — Encounter (HOSPITAL_BASED_OUTPATIENT_CLINIC_OR_DEPARTMENT_OTHER): Payer: Self-pay | Admitting: Internal Medicine

## 2015-04-16 DIAGNOSIS — F331 Major depressive disorder, recurrent, moderate: Secondary | ICD-10-CM

## 2015-04-17 MED ORDER — CITALOPRAM HYDROBROMIDE 10 MG PO TABS
10.0000 mg | ORAL_TABLET | Freq: Every day | ORAL | 0 refills | Status: DC
Start: 2015-04-17 — End: 2015-10-02

## 2015-04-25 ENCOUNTER — Ambulatory Visit (HOSPITAL_BASED_OUTPATIENT_CLINIC_OR_DEPARTMENT_OTHER): Payer: MEDICARE | Admitting: Social Worker

## 2015-04-25 DIAGNOSIS — F331 Major depressive disorder, recurrent, moderate: Principal | ICD-10-CM

## 2015-04-25 NOTE — Progress Notes (Signed)
Marland KitchenOUTPATIENT PSYCHIATRY PROGRESS NOTE    INTERPRETER: No    CONTACT INFO FOR OTHER AGENCIES AND MENTAL HEALTH PROVIDERS (IF APPLICABLE): N/A    PROBLEM(S) ADDRESSED IN THIS SESSION:   1. History of multiple therapies that have not ended well and/or felt retraumatizing  2. Issues with experiencing different self-states, from intellectual to "regressed"  3. Self-identifying as "a difficult pt"      SUBJECTIVE  TODAY'S CHIEF COMPLAINT AND CLINICAL UPDATES IN PATIENT'S WORDS:  1) Chief Complaint (Patient and/or guardian's own words, concerns and expressed thoughts): "I know I am a difficult patient, I feel it's only fair to lay it all out so you can decide to walk out at this point"    2) New information from patient and/or collateral (Patient's illness: context, course, modifying factors, severity, cultural, family, social, medical history):   Met with pt for the first time with Dr. [name], director of the [facility], who has been consulting with pt for several years in finding therapy providers  Pt explains she wanted Dr. Claiborne Rigg to be present to help pt remember different things she wanted to say, to hold in mind the different ways she can present (intellectualizing as well as "concrete") and also to "vouch for her" in helping pt give a balanced description of her issues, especially when it comes to previous experiences of psychotherapy  Pt has done extensive studying in psychoanalytic therapy, for example non-clinical fellowships at local psychoanalytic societies, and believes her issues have to do with disorganized attachment resulting from separate childhood events  Pt describes herself as "dissociating", and means that she can regress to the level of a toddler as well as not being fully aware of/not remembering afterwards what happens when she gets very angry - she describes that she has 'yelled' at therapists but denies ever having been physically threatening (for example, does not throw things)  Believes she needs  a therapist who will be able to recognize and deal with projective identification, especially when it comes to shame and inadequacy  Pt has met t/provider socially a few times in academic contexts, but in discussion with her and Dr. Claiborne Rigg we agree that this would not be a disqualifier for starting therapy     OBJECTIVE  DATA REVIEWED (Consider medical labs, radiology, other medical tests; screening/outcome measures; psychological testing; discussion of test results with other clinicians; consultation with other clinicians and systems involved with patient, summary of old records): Consulted Dr. Claiborne Rigg and EMR    CURRENT MEDICATIONS (make clear medications prescribed by psychiatry; include OTC medications):    Current Outpatient Prescriptions:  citalopram (CELEXA) 10 MG tablet Take 1 tablet by mouth daily X 10 days, then increase to 2 tabs daily. For: depression and anxiety. Disp: 50 tablet Rfl: 0   propranolol (INDERAL) 10 MG tablet Take 1 tablet by mouth 3 (three) times daily as needed For rapid heart rate or anxiety symptoms. Disp: 60 tablet Rfl: 1   THEANINE PO Take  by mouth. Disp:  Rfl:    Multiple Vitamin (MULTIVITAMINS PO)  Disp:  Rfl:      No current facility-administered medications for this visit.     MEDICATION ADHERENCE (including barriers and how addressed):Needs further evaluation    MEDICATION SIDE EFFECTS (Prescribers Only): N/A    BIRTH CONTROL (ask females and males): N/A    CURRENT PREGNANCY: N/A      MENTAL STATUS EXAMINATION  General Appearance: Groomed white woman wearing weather appropriate clothing, graying shoulder-length hair     Interaction with Interviewer (eye contact, attitude, behavior): Fair to good eye contact, cooperative, open    Physical Signs    Gait and Station (how patient walks and stands): Within normal limits                  Physical Appearance: Within normal limits          Normal Movements: Within normal limits         Speech (rate, volume,  articulation): Within normal limits          Language: Within normal limits                       Mood: "Okay"            Affect: Congruous with content, slightly anxious            Thought Process     Rate; concrete vs abstract reasoning: Within normal limits        Logical vs illogical; associations: loose, tangential, circumstantial, intact: Pt notes she is/can be mildly tangential at times                                    Thought Content    Normal Thought Content (other than safety): Within normal limits     Perceptions (auditory, visual, tactile, etc.): Within normal limits       Impulse Control: Within normal limits         Cognition (Link to [facility])    Orientation (person, place, time): Within normal limits      Recent and remote memory: Within normal limits           Attention span and concentration: Within normal limits         Fund of knowledge, awareness of current events and vocabulary: Not formally assessed; appear intact      Judgment: Within normal limits          Insight: Moderate to good        Suicidality/Homicidality/Aggression (Victimization or Perpetration): denies/denies/denies     ASSESSMENT   Today's Assessment:  Pt presenting for first meeting to assess whether to work together, attended also by Dr. Claiborne Rigg. Articulate, motivated to start treatment.    Risk Level Assessment  Risk Level Change (if yes, please describe): No    Suicide: low (1)  Violence: low (1)  Addiction: low (1)          DIAGNOSES ASSESSED TODAY (psychiatric diagnoses and medical diagnoses that factor into management of psychiatric treatment): PTSD, MDD, recurrent, moderate    CLINICAL FORMULATION (Make changes as your understanding changes. Should coincide with treatment plan): 34-yo white domiciled woman on disability, seeking continuation of psychotherapy after several briefer therapies (six providers in eight years.) Pt identifies herself as suffering from insecure attachment based on early childhood events, and is  looking for a somewhat relational, transference/countertransference- based therapy.    REVIEWING TODAY'S VISIT  CLINICAL INTERVENTIONS TODAY:   Gathered information  Assessed sx and risk  Discussed above topics  Discussed therapy frame, methods and earlier therapy experiences    PATIENT'S RESPONSE TO INTERVENTIONS: Pt was engaged and responsive    PROGRESS TOWARDS GOALS: N/A, first meeting    TIME SPENT IN PSYCHOTHERAPY: 45 min    [place]  RISK (Consider risk plan for patients at moderate or high risk for suicide/violence/addiction; medication plan; referrals, etc. Must coincide with treatment plan.): N/A    PLAN FOR ONGOING TREATMENT:  Start weekly psychotherapy    INFORMED CONSENT (for any new treatment): Patient was informed of the potential risks and benefits of the treatment, including the option not to treat, and appeared to understand and agreed to comply. Discussion included the following key points: Discussed pts rights to privacy as well as limits of confidentiality. Pt was notified that pertinent information disclosed in sessions will be documented in EMR. Pt consented to treatment.[name], PhD, LCSW

## 2015-05-03 ENCOUNTER — Ambulatory Visit (HOSPITAL_BASED_OUTPATIENT_CLINIC_OR_DEPARTMENT_OTHER): Payer: MEDICARE | Admitting: Social Worker

## 2015-05-03 DIAGNOSIS — F4312 Post-traumatic stress disorder, chronic: Principal | ICD-10-CM
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
## 2015-06-12 ENCOUNTER — Encounter (HOSPITAL_BASED_OUTPATIENT_CLINIC_OR_DEPARTMENT_OTHER): Payer: Self-pay | Admitting: Internal Medicine

## 2015-06-12 DIAGNOSIS — F4310 Post-traumatic stress disorder, unspecified: Secondary | ICD-10-CM

## 2015-06-13 MED ORDER — PROPRANOLOL HCL 10 MG PO TABS
10.0000 mg | ORAL_TABLET | Freq: Three times a day (TID) | ORAL | 3 refills | Status: DC | PRN
Start: 2015-06-13 — End: 2016-09-02

## 2015-06-27 ENCOUNTER — Ambulatory Visit (HOSPITAL_BASED_OUTPATIENT_CLINIC_OR_DEPARTMENT_OTHER): Payer: Self-pay | Admitting: Internal Medicine

## 2015-06-27 DIAGNOSIS — Z1239 Encounter for other screening for malignant neoplasm of breast: Principal | ICD-10-CM

## 2015-07-03 ENCOUNTER — Other Ambulatory Visit (HOSPITAL_BASED_OUTPATIENT_CLINIC_OR_DEPARTMENT_OTHER): Payer: Self-pay | Admitting: Internal Medicine

## 2015-07-03 DIAGNOSIS — Z1239 Encounter for other screening for malignant neoplasm of breast: Secondary | ICD-10-CM

## 2015-07-18 NOTE — Addendum Note (Signed)
Addended by: Jilda Roche on: 07/18/2015 03:53 PM     Modules accepted: Orders

## 2015-07-19 LAB — MA SCREENING MAMMO BILATERAL WITH CAD

## 2015-08-14 ENCOUNTER — Ambulatory Visit (HOSPITAL_BASED_OUTPATIENT_CLINIC_OR_DEPARTMENT_OTHER): Payer: MEDICARE | Admitting: Internal Medicine

## 2015-08-14 VITALS — BP 110/80 | HR 76 | Temp 99.0°F | Ht 62.8 in | Wt 117.0 lb

## 2015-08-14 DIAGNOSIS — Z0000 Encounter for general adult medical examination without abnormal findings: Principal | ICD-10-CM

## 2015-08-14 DIAGNOSIS — F4310 Post-traumatic stress disorder, unspecified: Secondary | ICD-10-CM

## 2015-08-14 NOTE — Progress Notes (Signed)
S: here for PE.  F/u, depression/anxiety sx's / management:      Meds:  Propranolol 10 mg BID-TID.    1. Propranolol: maybe exacerbating sx's?  Tried TID a couple d's, "spooked me", back to 2.  Sx's which worried her: ?anxiety coming up in diff ways: clenching my jaw and some agitated thinking.. But cd be related to things in her life.  Has past hx bruxism.  But not having sx's of palpitations in past month except v rarely, brief, not intense.  Wonders about trial taper to off.    2. Felt like crying more... Intense deep sobbing... Thought about ssri.  But also notes, +more stress in life.    3. Feels like had hot flashes, had x cpl yrs p menopause hot flashes, then stopped - now having some again.  Some in day, some in night.  No fever/chills.  A sudden sweat.  Not having looser stools.  No unexplained wt loss.    4. Having less frequent stools, but ?related to now lower fiber diet - now less salad, less oatmeal.    5. Continues to think about antidepressant meds.  But reluctant.  What about SAM-e or fish oil?    6. Has my cortisol level been checked in the past?  Wonders b/c connection w stress.  If more tense / stressed, wd levels be off?    7. Is stress related to central adiposity?    8. Sometimes scalp feels tight.  Sometimes feels like wiggling her ears.      Review of systems: energy level, appetite and sleep: all per baseline. No new or changing headaches.  No opthalmological concerns. No dental problems at present. Breathing: normal; no unexplained shortness of breath, no cough. Reports no chest pains or chest pressure, no palpitations.  Eating and eliminating: no issues at present. No changes in bowel habits or appearance. No genitourinary symptoms. Tries to get some exercise a couple days/week (walking, but just started in past few days). Re: general aches and pains in muscles or in joints: reports: OK.. Hard to move - feels tends to sit really still.    O:  BP 110/80  Pulse 76  Temp 99 F (37.2 C)  (Temporal)  Wt 53.1 kg (117 lb)  SpO2 96%  BMI 20.73 kg/m2   Alert, ambulatory, attentive, appropriate.   Affect normal.  HEENT: Scalp normal. Conjunctivae and sclerae are normal.  Oropharynx normal.  Neck is supple without masses, no cervical lymphadenopathy.  No thyromegaly.  Back without deformity.  Chest is clear to auscultation.   Heart sounds: normal S1 and S2, regular rate and rhythm, no murmurs.  Breasts: normal without suspicious masses, skin or nipple changes or axillary nodes.   Abdomen is soft, no tenderness or masses, no appreciable organomegaly.    Pelvic: not indicated.   Extremities are normal, without edema.   Skin: normal without concerning lesions noted.    A/p:                     Female Annual Wellness Visit  Progress Note     Subjective:   Brianna [name] is a 60 year old female who comes for the Subsequent Annual wellness visit, including Personalized Prevention Plan (PPPE) (it has been at least 12 months since the annual wellness visit)..     History: (These sections have been updated in their respective activities)   Review of Patient's Allergies indicates:   Penicillins  Hives    Comment:childhood.    Past Medical History    Anxiety     Astigmatism     Cataract     Disorder of bone and cartilage, unspecified BMD 8/04    Comment: Osteopenia only, femoral neck.    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 MAH: EGD, Cscopy 09/09/06: all normal. 2/08: rslvd.    Major depressive disorder, single episode, moderate     Myopia     Presbyopia     Thyroid disease     Wears eyeglasses        Past Surgical History    EXCISION TONSIL TAGS      IMPLANT MESH OPN HERNIA RPR/DEBRIDEMENT CLOSURE      Comment inguinal hernia    CATARACT REMOVAL INSERTION OF LENS         Family History    Mental/Emotional Disorders Mother     Mental/Emotional Disorders Father     Mental/Emotional Disorders Sister     Cancer - Breast Mother     Comment: dx'd at 29. D. age  59.       Social History   Marital status: Single  Spouse name: N/A    Years of education: N/A  Number of children: 0     Occupational History  disability      NONE      Social History Main Topics   Smoking status: Never Smoker    Smokeless tobacco: Not on file    Alcohol use No    Drug use: No    Sexual activity: Not Currently    Partners: Male    Comment: not since '05.     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  Previously in [place].  Lives alone.  Single since '05.  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       Current Outpatient Prescriptions:  propranolol (INDERAL) 10 MG tablet Take 1 tablet by mouth 3 (three) times daily as needed For rapid heart rate or anxiety symptoms. Disp: 90 tablet Rfl: 3   citalopram (CELEXA) 10 MG tablet Take 1 tablet by mouth daily X 10 days, then increase to 2 tabs daily. For: depression and anxiety. Disp: 50 tablet Rfl: 0   THEANINE PO Take  by mouth. Disp:  Rfl:    Multiple Vitamin (MULTIVITAMINS PO)  Disp:  Rfl:      No current facility-administered medications for this visit.     Providers and Suppliers (See demographics, encounters tab, filter by provider)  Tiburcio Bash, MD    [place], [place].  Phone: [phone] Fax: [phone]    Specialists:  Psychiatry    Screening:     Cognitive screen -  Cognitive impairment is not detected byobservation, patient report and family, friends or caretaker   Depression screen, See Questions 3-4 of Health Risk Assessment.  Last PHQ9 total score (Recorded from the Screening Questions section):  PHQ-9 TOTAL SCORE 02/26/2012   Questionnaire Total Score -   Doc FlowSheet Total Score 8      Vision screen -    Vision without corrective lenses: adequate for adl's.  Hearing loss screen -  Trouble hearing the television or radio when others can hear it? No  Straining or  struggling to hear/understand conversations? No   Falls risk screen, See Questions 19-20 of Health Risk Assessment. Function screen, See Questions 8-13 of Health Risk Assessment.   Screen for overweight and hypertension:   BP 110/80  Pulse 76  Temp 99 F (37.2 C) (Temporal)  Ht 5' 2.8" (1.595 m)  Wt 53.1 kg (117 lb)  SpO2 96%  BMI 20.86 kg/m2  Body mass index is 20.86 kg/(m^2).   One-time screening for abdominal aortic aneurysm (AAA) by ultrasonography in men aged 65 to 56 who have ever smoked:  Is not indicated in this female who reports that she has never smoked. She does not have any smokeless tobacco history on file.   Hepatitis B risk Assessment:    Because of lack of identifiable risk factors, her risk for hepatitis B is low (1) (moderate or high: consider Hep B vaccination)     Home safety screen:    Does home have throw rugs, poor lighting or slippery bathtub or shower? No  Does home LACK grab bars in bathrooms or handrails on stairs and steps? No  Does home LACK functioning smoke alarms? No   Health Risk Assessment: Recorded in the Screening Questions Section, can be reviewed in Flowsheets activity tab under [place]'.     All above screening responses were reviewed, and all positive or abnormal responses were further evaluation or sent for referral as appropriate.    Counseling:     Advanced Care Planning:  The patient's Code Status is documented as   The patient has identified a Healthcare Proxy (recorded in demographics section)    Health Maintenance: See Health Maintenance Report for details of screening done.    Health Maintenance Items Overdue and Due Soon  ZOSTER VACCINE,AGE 43 AND OLDER (ONCE) due on 07/03/2014  INFLUENZA VACCINE(1) due on 04/12/2015  PHYSICAL EXAM (AGE 38+) due on 05/31/2015    All Health Maintenance Items and DueDates -  ZOSTER VACCINE,AGE 43 AND OLDER (ONCE) due on 07/03/2014  INFLUENZA VACCINE(1) due on 04/12/2015  PHYSICAL EXAM (AGE 38+) due on 05/31/2015  AWQ  Questionnaire due on 11/03/2015  COLONOSCOPY due on 09/09/2016  MAMMOGRAPHY due on 07/17/2017  LIPID SCREENING due on 04/29/2018  PAP SMEAR due on 05/27/2018  HPV SCREENING due on 05/27/2018  HEALTH CARE PROXY due on 02/15/2019  TDAP/TD VACCINE(2 - Td) due on 04/30/2023  HIV SCREENING Completed  HEP B HIGH RISK VACCINE EVAL (ONCE) Completed  HEP C SCREEN (DOB [birth year]) Completed    Medicare GYN Exam:     N/i.    Assessment & Plan:     Acute Issues:  In addition to the above items, we also discussed the following acute issues:     Posttraumatic stress disorder    Identified Problems and Risks:  1. Posttraumatic stress disorder  Multiple sx's related to childhood trauma; Discussion of her situation, circumstances; brainstormed potential options; supportive.   She will decide if she wants to continue or d/c prn propranolol.  She will let me know re: f/u for all the sx's reported.      Orders, Prescriptions, and Referrals placed in this Encounter:  No orders of the defined types were placed in this encounter.      Personalized Instructions:   See Patient Instruction section

## 2015-08-23 ENCOUNTER — Encounter (HOSPITAL_BASED_OUTPATIENT_CLINIC_OR_DEPARTMENT_OTHER): Payer: Self-pay | Admitting: Internal Medicine

## 2015-08-28 NOTE — Telephone Encounter (Signed)
Hello!  Yes, it's not common but a few people may get some shivering, tremor or sensation of muscles twitching with all the anti-depressants.  They work on your serotonin system, and ordinary body phenomena like shivering, tremor, feeling hot, feeling cold, muscle symptoms, GI tract motility changes... Are these things are "run" by our serotonin system.  None of these symptoms are dangerous, and they tend to go away by themselves.    I am not worried by what you describe, and it is safe to watch and wait and see how your feelings evolve;  Best,  Blima Singer

## 2015-09-06 ENCOUNTER — Ambulatory Visit (HOSPITAL_BASED_OUTPATIENT_CLINIC_OR_DEPARTMENT_OTHER): Payer: MEDICARE | Admitting: Social Worker

## 2015-09-06 DIAGNOSIS — F4310 Post-traumatic stress disorder, unspecified: Secondary | ICD-10-CM

## 2015-09-06 DIAGNOSIS — F449 Dissociative and conversion disorder, unspecified: Principal | ICD-10-CM

## 2015-09-06 DIAGNOSIS — F331 Major depressive disorder, recurrent, moderate: Secondary | ICD-10-CM

## 2015-09-06 NOTE — Progress Notes (Signed)
[facility] KitchenOUTPATIENT PSYCHIATRY PROGRESS NOTE    INTERPRETER: No    CONTACT INFO FOR OTHER AGENCIES AND MENTAL HEALTH PROVIDERS (IF APPLICABLE): N/A    PROBLEM(S) ADDRESSED IN THIS SESSION:   1. Long wait before being able to start therapy w/t/provider d/t insurance/credentialing issues  2. Feeling of being let down and hurt by therapist not communicating as promised about timeline of starting treatment  3. Previous difficult experiences in therapy      SUBJECTIVE  TODAY'S CHIEF COMPLAINT AND CLINICAL UPDATES IN PATIENT'S WORDS:  1) Chief Complaint (Patient and/or guardian's own words, concerns and expressed thoughts): "I needed you to say you hurt me"    2) New information from patient and/or collateral (Patient's illness: context, course, modifying factors, severity, cultural, family, social, medical history):   Pt was on time for the appointment  Starting therapy after a month-long break d/t having to wait for t/provider be credentialed and hired to a staff position - pt has been keeping touch with email in the preceding months  Focus today on repair and negotiating trust after pt's hurt of not being kept in the loop as promised about when tx might actually be able to start  We discuss rupture and repair, and what pt would need for repair - ultimately, a simple admission that pt has been hurt by my actions is meaningful for pt "I've waited fifteen years with fifteen different therapists; I feel people get guilty and because they cannot handle it, they put it on the patient"  We discuss pt's emails and the reason why I wasn't able to respond to clinical material via email  Also touch on pt's concerns that I am not adequately experienced/trained to work with pts who have very early attachment trauma  Pt will return and start regular tx; themes will be returned to    [place] (Consider medical labs, radiology, other medical tests; screening/outcome measures; psychological testing; discussion of test results  with other clinicians; consultation with other clinicians and systems involved with patient, summary of old records): [place] (make clear medications prescribed by psychiatry; include OTC medications):    Current Outpatient Prescriptions:  propranolol (INDERAL) 10 MG tablet Take 1 tablet by mouth 3 (three) times daily as needed For rapid heart rate or anxiety symptoms. Disp: 90 tablet Rfl: 3   citalopram (CELEXA) 10 MG tablet Take 1 tablet by mouth daily X 10 days, then increase to 2 tabs daily. For: depression and anxiety. Disp: 50 tablet Rfl: 0   THEANINE PO Take  by mouth. Disp:  Rfl:    Multiple Vitamin (MULTIVITAMINS PO)  Disp:  Rfl:      No current facility-administered medications for this visit.     MEDICATION ADHERENCE (including barriers and how addressed):Needs further evaluation    MEDICATION SIDE EFFECTS (Prescribers Only): N/A    BIRTH CONTROL (ask females and males): N/A    CURRENT PREGNANCY: N/A      MENTAL STATUS EXAMINATION                     General Appearance: Groomed white woman wearing weather appropriate clothing, graying shoulder-length hair     Interaction with Interviewer (eye contact, attitude, behavior): Fair to good eye contact, cooperative, open    Physical Signs    Gait and Station (how patient walks and stands): Within normal limits  Physical Appearance: Within normal limits          Normal Movements: Within normal limits         Speech (rate, volume, articulation): Within normal limits          Language: Within normal limits                       Mood: "Okay"            Affect: Congruous with content, anxious but relaxing some as session continues       Thought Process     Rate; concrete vs abstract reasoning: Within normal limits        Logical vs illogical; associations: loose, tangential, circumstantial, intact: Pt notes she is/can be mildly tangential at times                                    Thought Content    Normal Thought  Content (other than safety): Within normal limits     Perceptions (auditory, visual, tactile, etc.): Within normal limits       Impulse Control: Within normal limits         Cognition (Link to [facility])    Orientation (person, place, time): Within normal limits      Recent and remote memory: Within normal limits           Attention span and concentration: Within normal limits         Fund of knowledge, awareness of current events and vocabulary: Not formally assessed; appear intact      Judgment: Within normal limits          Insight: Moderate to good        Suicidality/Homicidality/Aggression (Victimization or Perpetration): denies/denies/denies     ASSESSMENT   Today's Assessment:  Pt presenting to start psychotherapy after a longer waiting period d/t institutional issues. Engaged and appears motivated to start treatment.     Risk Level Assessment  Risk Level Change (if yes, please describe): No    Suicide: low (1)  Violence: low (1)  Addiction: low (1)          DIAGNOSES ASSESSED TODAY (psychiatric diagnoses and medical diagnoses that factor into management of psychiatric treatment): PTSD, MDD, recurrent, moderate    CLINICAL FORMULATION (Make changes as your understanding changes. 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    REVIEWING TODAY'S VISIT  CLINICAL INTERVENTIONS TODAY:   Gathered information  Assessed sx and risk  Discussed above topics  Discussed rupture and repaired loss of trust in therapist  Discussed therapy frame, methods and earlier therapy experiences  Explored and helped pt express and identify emotions that are difficult to access    PATIENT'S RESPONSE TO INTERVENTIONS: Pt was  engaged and responsive    PROGRESS TOWARDS GOALS: N/A    TIME SPENT IN PSYCHOTHERAPY: 45 min    [place] (Consider risk plan for patients at moderate or high risk for suicide/violence/addiction; medication plan; referrals, etc. Must coincide with treatment plan.): N/A    PLAN FOR ONGOING TREATMENT:  Start weekly psychotherapy    INFORMED CONSENT (for any new treatment): Patient  was informed of the potential risks and benefits of the treatment, including the option not to treat, and appeared to understand and agreed to comply. Discussion included the following key points: Discussed pts rights to privacy as well as limits of confidentiality. Pt was notified that pertinent information disclosed in sessions will be documented in EMR. Pt consented to treatment.[name], PhD, LICSW

## 2015-09-07 ENCOUNTER — Encounter (HOSPITAL_BASED_OUTPATIENT_CLINIC_OR_DEPARTMENT_OTHER): Payer: Self-pay | Admitting: Internal Medicine

## 2015-09-12 ENCOUNTER — Ambulatory Visit (HOSPITAL_BASED_OUTPATIENT_CLINIC_OR_DEPARTMENT_OTHER): Payer: Self-pay

## 2015-09-12 NOTE — Dental Note (Signed)
Medical Alert: Allergies    Mental Disorders    Other    Vitamin Deficiency  Medications: Other, celexa  Allergies:      Penicillin  Since Last Visit: Medical Alert: No Change    Medications: Change    Allergies:        No Change  Pain Scale Type: Numeric Pain Scale Pain Level: 0  Description    Patient presented for a hygiene visit.    Interpreter: None    Patient pain level at beginning of appointment: 0    *Medical/Earvin Blazier History*  Medical history: No change    *Radiographs*  Radiographs taken: None taken  Radiographs taken to evaluate for: N/A    *Hygiene Observations*  - Overall Oral Hygiene:Fair  - Plaque: Medium  - Staining: Light  - Calculus:Lite to  Medium  - Food Impaction: Light to Moderate  - Bleeding: Light  - Gingival Attachment: Recession  - Periodontal description: Generalized fibrotic tissue  - Periodontal probing: N/A  - Perio classification: II-Chronic Periodontitis, generalized, moderate  severity  -Home Care: Fair  -Intraoral exam: Dry mouth    *Treatment*  Services provided: Prophy and Oral hygiene Instruction.  Prophylaxis today consisted of hand scale, prophy cup polish, Cavitron and  flossing. Proper technique of brushing demonstrated.  Flossing with fingers   and   a floss holder demonstrated.  Patient given a soft toothbrush and floss.    Patient pain level at end of appointment: 0    Patient left ambulatory, satisfied with treatment. Any remaining planned  treatments were explained in detail to the patient.    Hygiene Recare 6 month interval    ----- Signed on Wednesday, September 12, 2015 at 10:53:06 AM  -----  ----- Provider: Jamie Brookes - Quillian Quince, RDH -- Clinic: [facility] -----

## 2015-09-12 NOTE — Dental Note (Signed)
Medication: Other  Change Type:  Added  Dosage:  Duration:  0 Day(s)  Notes:   Celexa    Medical Alert: Allergies    Mental Disorders    Other    Vitamin Deficiency  Medications: Other  Allergies:      Penicillin  Since Last Visit: Medical Alert: No Change    Medications: Change    Allergies:        No Change  Pain Scale Type: Numeric Pain Scale Pain Level: 0  Description:    Patient presented for an amalgam restoration #31, OB.    Patient pain level at beginning of appointment: 0    Interpreter: None    *Medical/Caylon Saine History*  Chief complaint: " I am here for my filling"  HPI: Patient is here to continue restorative treatment plan. Last Periodic   exam   10/2014.  Medical history reviewed and updated.    *Amalgam Restoration*  Reason(s) for restoration(s): Primary caries  Radiographs taken: None taken  Radiographs taken to evaluate for: N/A  Local anesthetic used: 2% Lidocaine (1:100,000 epinephrine)  Whole number of carpules used: 1  Type of injection:  Existing material removed: None  Liner and base placed: None    The tooth was prepared with adequate water irrigation. GS-80 fast set amalgam  was placed, condensed, and burnished. Restoration was carved to ideal  anatomy. Margins, contacts, and occlusion were checked and adjusted as  needed. Post-op instructions given. Patient instructed not to bite on this   side   of the mouth for at least 24 hours.    *Assessment*  Prescription given: None  Referral: Not given  Assessment/Plan: Patient presents to the clinic to continue restorative  treatment plan. Patient shows moderate signs of anxiety.  1) Bite block must be used each appt. Patient has a difficulty opening her  mouth.  2) Thoroughly explain tx to be completed before starting.    Patient pain level at end of appointment: 0    Patient left ambulatory, satisfied with treatment. Any remaining planned  treatments were explained in detail to the patient.    Assistant: Noralee Space    *Instructions for  Receptionist*  What Procedure? Periodic  Which Provider? Carmell Austria  How Soon?  Reason?    ----- Signed on Wednesday, September 12, 2015 at 9:42:17 AM  -----  ----- Provider: Rolla Flatten - Tollie Pizza,  -- Clinic: [facility] -----

## 2015-09-13 ENCOUNTER — Ambulatory Visit (HOSPITAL_BASED_OUTPATIENT_CLINIC_OR_DEPARTMENT_OTHER): Payer: MEDICARE | Admitting: Social Worker

## 2015-09-13 DIAGNOSIS — F4310 Post-traumatic stress disorder, unspecified: Secondary | ICD-10-CM

## 2015-09-13 DIAGNOSIS — F449 Dissociative and conversion disorder, unspecified: Principal | ICD-10-CM
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
## 2015-09-18 ENCOUNTER — Ambulatory Visit (HOSPITAL_BASED_OUTPATIENT_CLINIC_OR_DEPARTMENT_OTHER): Payer: MEDICARE | Admitting: Social Worker

## 2015-09-18 DIAGNOSIS — F4310 Post-traumatic stress disorder, unspecified: Secondary | ICD-10-CM

## 2015-09-18 DIAGNOSIS — F449 Dissociative and conversion disorder, unspecified: Secondary | ICD-10-CM

## 2015-09-18 NOTE — Progress Notes (Signed)
Brianna KitchenOUTPATIENT PSYCHIATRY PROGRESS NOTE    INTERPRETER: No    CONTACT INFO FOR OTHER AGENCIES AND MENTAL HEALTH PROVIDERS (IF APPLICABLE): N/A    PROBLEM(S) ADDRESSED IN THIS SESSION:   1. Flashbacks and dissociation  2. Question of higher-level care to help with increased sx  3. Strategies for working in therapy      [place] UPDATES IN PATIENT'S WORDS:  1) Chief Complaint (Patient and/or guardian's own words, concerns and expressed thoughts): "if I were 28, I could wait and try to see, but at best I'm stagnating and at worst I'm regressing.... I need more help"    2) New information from patient and/or collateral (Patient's illness: context, course, modifying factors, severity, cultural, family, social, medical history):   Pt was on time for the appointment  Had called about self-compassion group but hadn't received a call back yet  Seen twice/week currently d/t trying to stabilize pt without higher level of care, however, pt expresses a wish to continue to talk about higher-level care (residential or partial hospitalization) - pt hasn't attended a PHP or residential before, but has a multiple OP tx relationships, most of which have not felt particularly helpful to pt and by a few of which she feels more traumatized by, especially after pt started to regress and dissociate after a particular past therapy tx in the 90s - we agreed that pt will further look into what she thinks would be helpful to her, and we will continue to discuss  "my presentation isn't typical, as I don't have impulsivity or self-harming... I fear I can't get help because of that"   We continue to discuss ruptures and possible repairs in the current tx relationship  Pt further hones in on what is and isn't helpful in interactions with therapists in general and this therapist in particular - modeling connecting feelings and words and not dissociating from own feelings in the therapy room in order to help pt  do the same with self-compassion are some of the things pt feels would be most helpful to her    OBJECTIVE  DATA REVIEWED (Consider medical labs, radiology, other medical tests; screening/outcome measures; psychological testing; discussion of test results with other clinicians; consultation with other clinicians and systems involved with patient, summary of old records): Consulted EMR    CURRENT MEDICATIONS (make clear medications prescribed by psychiatry; include OTC medications):    Current Outpatient Prescriptions:  propranolol (INDERAL) 10 MG tablet Take 1 tablet by mouth 3 (three) times daily as needed For rapid heart rate or anxiety symptoms. Disp: 90 tablet Rfl: 3   citalopram (CELEXA) 10 MG tablet Take 1 tablet by mouth daily X 10 days, then increase to 2 tabs daily. For: depression and anxiety. Disp: 50 tablet Rfl: 0   THEANINE PO Take  by mouth. Disp:  Rfl:    Multiple Vitamin (MULTIVITAMINS PO)  Disp:  Rfl:      No current facility-administered medications for this visit.     MEDICATION ADHERENCE (including barriers and how addressed):Needs further evaluation    MEDICATION SIDE EFFECTS (Prescribers Only): N/A    BIRTH CONTROL (ask females and males): N/A    CURRENT PREGNANCY: N/A      MENTAL STATUS EXAMINATION                     General Appearance: Groomed white woman wearing weather appropriate clothing, graying shoulder-length hair     Interaction with Interviewer (eye contact,  attitude, behavior): Fair to good eye contact, cooperative, open    Physical Signs    Gait and Station (how patient walks and stands): Within normal limits                  Physical Appearance: Within normal limits          Normal Movements: Within normal limits         Speech (rate, volume, articulation): Within normal limits          Language: Within normal limits                       Mood: "I need help"            Affect: Congruous with content, briefly tearful      Thought Process     Rate; concrete vs abstract reasoning:  Within normal limits        Logical vs illogical; associations: loose, tangential, circumstantial, intact: Pt notes she is/can be mildly tangential at times                                    Thought Content    Normal Thought Content (other than safety): Within normal limits     Perceptions (auditory, visual, tactile, etc.): Within normal limits       Impulse Control: Within normal limits         Cognition (Link to [facility])    Orientation (person, place, time): Within normal limits      Recent and remote memory: Within normal limits           Attention span and concentration: Within normal limits         Fund of knowledge, awareness of current events and vocabulary: Not formally assessed; appear intact      Judgment: Within normal limits          Insight: Moderate to good        Suicidality/Homicidality/Aggression (Victimization or Perpetration): denies/denies/denies     ASSESSMENT   Today's Assessment:  Pt is stable, but continues to request higher level of care. No SI/SH.    Risk Level Assessment  Risk Level Change (if yes, please describe): No    Suicide: low (1)  Violence: low (1)  Addiction: low (1)          DIAGNOSES ASSESSED TODAY (psychiatric diagnoses and medical diagnoses that factor into management of psychiatric treatment): PTSD, MDD, recurrent, moderate    CLINICAL FORMULATION (Make changes as your understanding changes. 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 Pt's strengths include intelligence, sense of humor, perseverence.    REVIEWING TODAY'S VISIT  CLINICAL INTERVENTIONS TODAY:   Gathered information  Assessed sx  and risk  Discussed above topics  Discussed therapy ruptures and repairing loss of trust in therapist  Continued to discuss therapy frame and methods/interventions that work and don't feel right for pt  Explored and helped pt express and identify emotions that are difficult to access  Discussed level of care appropriate to sx severity    PATIENT'S RESPONSE TO INTERVENTIONS: Pt was engaged and responsive  PROGRESS TOWARDS GOALS: some    TIME SPENT IN PSYCHOTHERAPY: 45 min    PLAN  PLAN FOR MANAGING RISK (Consider risk plan for patients at moderate or high risk for suicide/violence/addiction; medication plan; referrals, etc. Must coincide with treatment plan.): N/A    PLAN FOR ONGOING TREATMENT:    1. Go to twice weekly psychotherapy until sx severity more manageable for pt, then return to once/week   2. Additional referral to group therapy  3. Consult with prescriber and PCP as needed    INFORMED CONSENT (for any new treatment): Patient was informed of the potential risks and benefits of the treatment, including the option not to treat, and appeared to understand and agreed to comply. Discussion included the following key points: N/A    [name], PhD, LICSW

## 2015-09-20 ENCOUNTER — Ambulatory Visit (HOSPITAL_BASED_OUTPATIENT_CLINIC_OR_DEPARTMENT_OTHER): Payer: MEDICARE | Admitting: Social Worker

## 2015-09-21 ENCOUNTER — Ambulatory Visit (HOSPITAL_BASED_OUTPATIENT_CLINIC_OR_DEPARTMENT_OTHER): Payer: MEDICARE | Admitting: Social Worker

## 2015-09-21 DIAGNOSIS — F449 Dissociative and conversion disorder, unspecified: Secondary | ICD-10-CM

## 2015-09-21 DIAGNOSIS — F4310 Post-traumatic stress disorder, unspecified: Secondary | ICD-10-CM

## 2015-09-21 NOTE — Progress Notes (Signed)
Marland KitchenOUTPATIENT PSYCHIATRY PROGRESS NOTE    INTERPRETER: No    CONTACT INFO FOR OTHER AGENCIES AND MENTAL HEALTH PROVIDERS (IF APPLICABLE): N/A    PROBLEM(S) ADDRESSED IN THIS SESSION:   1. Level of functioning  2. Question of higher-level care to help with increased sx  3. Strategies for working in therapy      [place] UPDATES IN PATIENT'S WORDS:  1) Chief Complaint (Patient and/or guardian's own words, concerns and expressed thoughts): "It's just starting to sink in what bad shape I'm in"    2) New information from patient and/or collateral (Patient's illness: context, course, modifying factors, severity, cultural, family, social, medical history):   Pt was on time for the appointment  Scheduled to today d/t cancellation for weather reasons yesterday  Pt hadn't focused on looking at higher level of care programs because she's directed her energy toward submission to a [place] conference (DL today) - we decide to table the question of referral to higher level care to next Tuesday's meeting - pt saying it is a big step for her to admit how badly she feels and how much help she might need to manage - no SI/self-harm ideation but continuing sense of not being able to cope  We continue to discuss rupture caused by provider not emailing weekly to keep pt updated on when pt might be able to start therapy with t/provider; pt pointing out that being able to tell a provider she might not be able to trust her is already a sign of trust  Also continue to discuss most useful ways of working with pt and what she feels she needs the most from therapy - pt is formulating this as a combination of "mentalization and metabolization" meaning that at times she might need differing amounts of help both understanding/being able to mentalize others' thought processes and an emotional response that helps the pt access her own emotions    OBJECTIVE  DATA REVIEWED (Consider medical labs, radiology, other  medical tests; screening/outcome measures; psychological testing; discussion of test results with other clinicians; consultation with other clinicians and systems involved with patient, summary of old records): Consulted EMR    CURRENT MEDICATIONS (make clear medications prescribed by psychiatry; include OTC medications):    Current Outpatient Prescriptions:  propranolol (INDERAL) 10 MG tablet Take 1 tablet by mouth 3 (three) times daily as needed For rapid heart rate or anxiety symptoms. Disp: 90 tablet Rfl: 3   citalopram (CELEXA) 10 MG tablet Take 1 tablet by mouth daily X 10 days, then increase to 2 tabs daily. For: depression and anxiety. Disp: 50 tablet Rfl: 0   THEANINE PO Take  by mouth. Disp:  Rfl:    Multiple Vitamin (MULTIVITAMINS PO)  Disp:  Rfl:      No current facility-administered medications for this visit.     MEDICATION ADHERENCE (including barriers and how addressed):Needs further evaluation    MEDICATION SIDE EFFECTS (Prescribers Only): N/A    BIRTH CONTROL (ask females and males): N/A    CURRENT PREGNANCY: N/A      MENTAL STATUS EXAMINATION                     General Appearance: Groomed white woman wearing weather appropriate clothing, graying shoulder-length hair     Interaction with Interviewer (eye contact, attitude, behavior): Fair to good eye contact, cooperative, open    Physical Signs    Gait and Station (how patient walks and stands):  Within normal limits                  Physical Appearance: Within normal limits          Normal Movements: Within normal limits         Speech (rate, volume, articulation): Within normal limits          Language: Within normal limits                       Mood: "confused"            Affect: Congruous with content, not tearful      Thought Process     Rate; concrete vs abstract reasoning: Within normal limits        Logical vs illogical; associations: loose, tangential, circumstantial, intact: Pt notes she is/can be mildly tangential at times                                     Thought Content    Normal Thought Content (other than safety): Within normal limits     Perceptions (auditory, visual, tactile, etc.): Within normal limits       Impulse Control: Within normal limits         Cognition (Link to [facility])    Orientation (person, place, time): Within normal limits      Recent and remote memory: Within normal limits           Attention span and concentration: Within normal limits         Fund of knowledge, awareness of current events and vocabulary: Not formally assessed; appear intact      Judgment: Within normal limits          Insight: Moderate to good        Suicidality/Homicidality/Aggression (Victimization or Perpetration): denies/denies/denies     ASSESSMENT   Today's Assessment:  Pt is stable, but continues to request higher level of care. No SI/SHI.    Risk Level Assessment  Risk Level Change (if yes, please describe): No    Suicide: low (1)  Violence: low (1)  Addiction: low (1)          DIAGNOSES ASSESSED TODAY (psychiatric diagnoses and medical diagnoses that factor into management of psychiatric treatment): PTSD, MDD, recurrent, moderate    CLINICAL FORMULATION (Make changes as your understanding changes. 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 Pt's strengths include intelligence, sense of humor, perseverence.    REVIEWING TODAY'S VISIT  CLINICAL INTERVENTIONS TODAY:   Gathered information  Assessed sx and risk  Discussed above topics  Discussed therapy ruptures and repairing loss of trust in therapist  Continued to discuss therapy frame and methods/interventions that  work and don't feel right for pt  Explored and helped pt express and identify emotions that are difficult to access  Discussed level of care appropriate to sx severity    PATIENT'S RESPONSE TO INTERVENTIONS: Pt was engaged and responsive    PROGRESS TOWARDS GOALS: moderate    TIME SPENT IN PSYCHOTHERAPY: 45 min    PLAN  PLAN FOR MANAGING RISK (Consider risk  plan for patients at moderate or high risk for suicide/violence/addiction; medication plan; referrals, etc. Must coincide with treatment plan.): N/A    PLAN FOR ONGOING TREATMENT:    1. Continue twice weekly psychotherapy until sx severity more manageable for pt, then return to once/week   2. Additional referral to group therapy  3. Consult with prescriber and PCP as needed  4. Consider referral to higher level care if appropriate    INFORMED CONSENT (for any new treatment): Patient was informed of the potential risks and benefits of the treatment, including the option not to treat, and appeared to understand and agreed to comply. Discussion included the following key points: N/A    [name], PhD, LICSW

## 2015-09-24 ENCOUNTER — Ambulatory Visit (HOSPITAL_BASED_OUTPATIENT_CLINIC_OR_DEPARTMENT_OTHER): Payer: MEDICARE | Admitting: Clinical

## 2015-09-24 DIAGNOSIS — F449 Dissociative and conversion disorder, unspecified: Principal | ICD-10-CM

## 2015-09-24 NOTE — Progress Notes (Signed)
[facility] KitchenGROUP THERAPY SCREENING EVALUATION    Referred by: Therapist: Nilda Riggs    Group referred for: Self Compassion                                                           Current treaters:   Individual therapist: Nilda Riggs            Contact number: [place]   Psychopharmacologist:                    Contact number:     Primary care clinician: Tiburcio Bash                          Contact number: [phone]         SUBJECTIVE INFORMATION:  Presenting problem(s): Would like to be part of a group, but not to discuss trauma. Not sure that she understands what compassion is. Does not have words to express her feelings. Feeling desparate.Reports multiple therapists, recently began a new therapy. Feels that she does not have a life and wants to    Relevant history: Did not discuss trauma history.   History described in past notes. 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    Patient's goals for group: Find better ways to cope, improve life.       Patient's concerns related to group:  Pt. Questions about whether she would fit in; goal would be to be more compassionate about not feeling safe.  Patient presented as guarded and unsure that this group would be beneficial. She reports experiencing her trauma symptoms more somatically and therefore language is sometimes difficult for her to translated into helpful concepts.    Previous group experience: Several group tries but wound up feeling dissatisfied with the ways they were run.    OBJECTIVE INFORMATION:  Mental status: Patient presents her stated age, casually dressed and engaged in session. Does report difficulty in self care in contrast to appearing more high functionning. Thoughts/cognitition/perceptions appear in tact, with some episodes of staring off. No reports of SI or HI       Risk issues: May become too activated to benefit from such a short term group.      Insurance mandates:     75 minute  group    ASSESSMENT:  Formulation: 60 year old single Caucasian woman seeking group treatment to improve skills of managing symptoms of PTSD which she describes as being in the more disconnected range. Despite the pt. Insistence that she does not have the capacity to think clearly or understand concepts, she appropriately asked for clarifications when she did not follow our conversation.  once space was open for her to explore her inner self, she was able to articulate that she would like to be able to be more compassionate about not feeling safe. Given her experience of empathetic failures and dissatisfaction in group contexts, i encouraged her to discuss the group with her current therapist, and i would find out information about the Trauma and the Body group since she identified somatic sensations as most prominent.         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  Be in touch within a week                                      [name], LICSW                      Date: 09/24/2015

## 2015-09-25 ENCOUNTER — Ambulatory Visit (HOSPITAL_BASED_OUTPATIENT_CLINIC_OR_DEPARTMENT_OTHER): Payer: MEDICARE | Admitting: Social Worker

## 2015-09-25 DIAGNOSIS — F4310 Post-traumatic stress disorder, unspecified: Secondary | ICD-10-CM

## 2015-09-25 DIAGNOSIS — F331 Major depressive disorder, recurrent, moderate: Secondary | ICD-10-CM

## 2015-09-25 DIAGNOSIS — F449 Dissociative and conversion disorder, unspecified: Secondary | ICD-10-CM

## 2015-09-25 NOTE — Progress Notes (Signed)
[facility] KitchenOUTPATIENT PSYCHIATRY PROGRESS NOTE    INTERPRETER: No    CONTACT INFO FOR OTHER AGENCIES AND MENTAL HEALTH PROVIDERS (IF APPLICABLE): N/A    PROBLEM(S) ADDRESSED IN THIS SESSION:   1. Level of functioning  2. Question of additional and/or higher-level care to help with increased sx  3. Strategies for working in therapy      SUBJECTIVE  TODAY'S CHIEF COMPLAINT AND CLINICAL UPDATES IN PATIENT'S WORDS:  1) Chief Complaint (Patient and/or guardian's own words, concerns and expressed thoughts): "I'd like to see this woman trained in body work who's been helpful in the past"    2) New information from patient and/or collateral (Patient's illness: context, course, modifying factors, severity, cultural, family, social, medical history):   Pt was on time for the appointment  Had interview for self-compassion group yesterday; pt has some worries about only being able to attend to self-compassion on an "intellectual level" d/t her feeling that she has deficiencies on a more pre-verbal/conceptual level d/t her attachment trauma when she was very young - however, pt thinks she might be able to get benefit from group and would like to give it a try  At the same time, pt would like to try to reach a therapist trained in body work who pt has worked with before and see her in addition to individual and possible group tx - pt took with her a disclosure form to fill to permit t/provider communication with the previous provider  Pt continues to have concerns about needing to correct provider in correct/comforting responses to pt - especially when it comes to "visceral comforting responses" pt feels she would need the most, especially when she is crying - pt is not sure she can express what she would find most comforting, but intellectual responses/asking questions/saying "I'm here" have not felt right in the past - saying "it's okay to cry" might be better  Pt feels that when she is looking away from the provider while crying, she is  experiencing a form of dissociation    OBJECTIVE  DATA REVIEWED (Consider medical labs, radiology, other medical tests; screening/outcome measures; psychological testing; discussion of test results with other clinicians; consultation with other clinicians and systems involved with patient, summary of old records): [place] (make clear medications prescribed by psychiatry; include OTC medications):    Current Outpatient Prescriptions:  propranolol (INDERAL) 10 MG tablet Take 1 tablet by mouth 3 (three) times daily as needed For rapid heart rate or anxiety symptoms. Disp: 90 tablet Rfl: 3   citalopram (CELEXA) 10 MG tablet Take 1 tablet by mouth daily X 10 days, then increase to 2 tabs daily. For: depression and anxiety. Disp: 50 tablet Rfl: 0   THEANINE PO Take  by mouth. Disp:  Rfl:    Multiple Vitamin (MULTIVITAMINS PO)  Disp:  Rfl:      No current facility-administered medications for this visit.     MEDICATION ADHERENCE (including barriers and how addressed):Needs further evaluation    MEDICATION SIDE EFFECTS (Prescribers Only): N/A    BIRTH CONTROL (ask females and males): N/A    CURRENT PREGNANCY: N/A      MENTAL STATUS EXAMINATION                     General Appearance: Groomed white woman wearing weather appropriate clothing, graying shoulder-length hair     Interaction with Interviewer (eye contact, attitude, behavior): Fair to good eye contact, cooperative, open  Physical Signs    Gait and Station (how patient walks and stands): Within normal limits                  Physical Appearance: Within normal limits          Normal Movements: Within normal limits         Speech (rate, volume, articulation): Within normal limits          Language: Within normal limits                       Mood: "sad"            Affect: Congruous with content, tearful at several points      Thought Process     Rate; concrete vs abstract reasoning: Within normal limits        Logical vs illogical;  associations: loose, tangential, circumstantial, intact: Pt notes she is/can be mildly tangential at times                                    Thought Content    Normal Thought Content (other than safety): Within normal limits     Perceptions (auditory, visual, tactile, etc.): Within normal limits       Impulse Control: Within normal limits         Cognition (Link to [facility])    Orientation (person, place, time): Within normal limits      Recent and remote memory: Within normal limits           Attention span and concentration: Within normal limits         Fund of knowledge, awareness of current events and vocabulary: Not formally assessed; appear intact      Judgment: Within normal limits          Insight: Moderate to good        Suicidality/Homicidality/Aggression (Victimization or Perpetration): denies/denies/denies     ASSESSMENT   Today's Assessment:  Pt is stable, but continues to request higher level of/more diverse care. No SI/SHI.    Risk Level Assessment  Risk Level Change (if yes, please describe): No    Suicide: low (1)  Violence: low (1)  Addiction: low (1)          DIAGNOSES ASSESSED TODAY (psychiatric diagnoses and medical diagnoses that factor into management of psychiatric treatment): PTSD, Dissociative symptoms; MDD, recurrent, moderate    CLINICAL FORMULATION (Make changes as your understanding changes. 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 Pt's strengths include intelligence, sense of humor, perseverence.    REVIEWING TODAY'S VISIT  CLINICAL INTERVENTIONS TODAY:   Gathered information  Assessed sx  and risk  Discussed above topics  Discussed therapy ruptures and repairs  Continued to discuss therapy frame and methods/interventions that work and don't feel right for pt  Explored and helped pt express and identify emotions that are difficult to access  Discussed level of care appropriate to sx severity  Discussed appropriateness of group tx     PATIENT'S RESPONSE TO INTERVENTIONS: Pt was engaged and responsive    PROGRESS TOWARDS GOALS: some  TIME SPENT IN PSYCHOTHERAPY: 45 min    PLAN  PLAN FOR MANAGING RISK (Consider risk plan for patients at moderate or high risk for suicide/violence/addiction; medication plan; referrals, etc. Must coincide with treatment plan.): N/A    PLAN FOR ONGOING TREATMENT:    1. Continue twice weekly psychotherapy until sx severity more manageable for pt, then return to once/week   2. Additional referral to group therapy  3. Consult with prescriber and PCP as needed  4. Consider referral to higher level care if appropriate    INFORMED CONSENT (for any new treatment): Patient was informed of the potential risks and benefits of the treatment, including the option not to treat, and appeared to understand and agreed to comply. Discussion included the following key points: N/A    [name], PhD, LICSW

## 2015-09-27 ENCOUNTER — Ambulatory Visit (HOSPITAL_BASED_OUTPATIENT_CLINIC_OR_DEPARTMENT_OTHER): Payer: MEDICARE | Admitting: Social Worker

## 2015-09-27 DIAGNOSIS — F4310 Post-traumatic stress disorder, unspecified: Secondary | ICD-10-CM

## 2015-09-27 DIAGNOSIS — F331 Major depressive disorder, recurrent, moderate: Principal | ICD-10-CM

## 2015-09-27 DIAGNOSIS — F449 Dissociative and conversion disorder, unspecified: Secondary | ICD-10-CM

## 2015-09-27 NOTE — Progress Notes (Signed)
[facility] KitchenOUTPATIENT PSYCHIATRY PROGRESS NOTE    INTERPRETER: No    CONTACT INFO FOR OTHER AGENCIES AND MENTAL HEALTH PROVIDERS (IF APPLICABLE): Outside therapist [name], Psy.D, [phone]    PROBLEM(S) ADDRESSED IN THIS SESSION:   1. Level of functioning  2. Question of additional care to help with increased sx  3. Strategies for working in therapy      [place] UPDATES IN PATIENT'S WORDS:  1) Chief Complaint (Patient and/or guardian's own words, concerns and expressed thoughts): "I'm looking for someone who can respond caringly in a way I can viscerally internalize... I'm discouraged about getting distracted from affect"    2) New information from patient and/or collateral (Patient's illness: context, course, modifying factors, severity, cultural, family, social, medical history):   Pt was on time for the appointment  Continuing on the topic of what pt feels she needs the most - a visceral, soothing response to her emotions to help her metabolize - and what she fears she hasn't gotten from anyone, including this therapist - more specifically being worried about "getting distracted from feelings" and "going the verbalization route"  Pt says she "hates roleplaying" but has asked a previous therapist to roleplay how they would respond to her being sad, and never gotten that - we discuss what might be problematic about such a roleplay, ie. 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 That sounds about right to me."  Pt gives authorization to communicate with therapist she is seeking additional tx from      [place] (Consider medical labs,  radiology, other medical tests; screening/outcome measures; psychological testing; discussion of test results with other clinicians; consultation with other clinicians and systems involved with patient, summary of old records): Consulted EMR    CURRENT MEDICATIONS (make clear medications prescribed by psychiatry; include OTC medications):    Current Outpatient Prescriptions:  propranolol (INDERAL) 10 MG tablet Take 1 tablet by mouth 3 (three) times daily as needed For rapid heart rate or anxiety symptoms. Disp: 90 tablet Rfl: 3   citalopram (CELEXA) 10 MG tablet Take 1 tablet by mouth daily X 10 days, then increase to 2 tabs daily. For: depression and anxiety. Disp: 50 tablet Rfl: 0   THEANINE PO Take  by mouth. Disp:  Rfl:    Multiple Vitamin (MULTIVITAMINS PO)  Disp:  Rfl:      No current facility-administered medications for this visit.     MEDICATION ADHERENCE (including barriers and how addressed):Needs further evaluation    MEDICATION SIDE EFFECTS (Prescribers Only): N/A    BIRTH CONTROL (ask females and males): N/A    CURRENT PREGNANCY: N/A      MENTAL STATUS EXAMINATION                     General Appearance: Groomed white woman wearing weather appropriate clothing, graying shoulder-length hair     Interaction with Interviewer (eye contact, attitude, behavior): Fair to good eye contact, cooperative, open    Physical Signs    Gait and Station (how patient walks and stands): Within normal limits  Physical Appearance: Within normal limits          Normal Movements: Within normal limits         Speech (rate, volume, articulation): Within normal limits          Language: Within normal limits                       Mood: "sad"            Affect: Congruous with content, variable     Thought Process     Rate; concrete vs abstract reasoning: Within normal limits        Logical vs illogical; associations: loose, tangential, circumstantial, intact: Pt notes she is/can be mildly tangential at times                                     Thought Content    Normal Thought Content (other than safety): Within normal limits     Perceptions (auditory, visual, tactile, etc.): Within normal limits       Impulse Control: Within normal limits         Cognition (Link to [facility])    Orientation (person, place, time): Within normal limits      Recent and remote memory: Within normal limits           Attention span and concentration: Within normal limits         Fund of knowledge, awareness of current events and vocabulary: Not formally assessed; appear intact      Judgment: Within normal limits          Insight: Moderate to good        Suicidality/Homicidality/Aggression (Victimization or Perpetration): denies/denies/denies     ASSESSMENT   Today's Assessment:  Pt is stable, continues to report a continuous sadness/difficulty functioning. No SI/SHI.    Risk Level Assessment  Risk Level Change (if yes, please describe): No    Suicide: low (1)  Violence: low (1)  Addiction: low (1)          DIAGNOSES ASSESSED TODAY (psychiatric diagnoses and medical diagnoses that factor into management of psychiatric treatment): PTSD, Dissociative symptoms; MDD, recurrent, moderate    CLINICAL FORMULATION (Make changes as your understanding changes. 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 Pt's strengths include intelligence, sense of humor, perseverence.    REVIEWING TODAY'S VISIT  CLINICAL INTERVENTIONS TODAY:   Gathered information  Offered support  Assessed sx and risk  Discussed above topics  Discussed therapy ruptures and repairs  Continued to  discuss therapy frame and methods/interventions that work and don't feel right for pt  Explored and helped pt express and identify emotions that are difficult to access  Discussed level of care appropriate to sx severity      PATIENT'S RESPONSE TO INTERVENTIONS: Pt was engaged and responsive    PROGRESS TOWARDS GOALS: some    TIME SPENT IN PSYCHOTHERAPY: 45 min    PLAN  PLAN FOR MANAGING RISK (Consider risk plan for patients at moderate or high risk for suicide/violence/addiction; medication plan; referrals, etc. Must coincide with treatment plan.): N/A  PLAN FOR ONGOING TREATMENT:    1. Continue twice weekly psychotherapy until sx severity more manageable for pt, then return to once/week   2. Additional referral to group therapy  3. Consult with prescriber and PCP as needed  4. Consider referral to higher level care if appropriate    INFORMED CONSENT (for any new treatment): Patient was informed of the potential risks and benefits of the treatment, including the option not to treat, and appeared to understand and agreed to comply. Discussion included the following key points: N/A    [name], PhD, LICSW

## 2015-10-01 ENCOUNTER — Encounter (HOSPITAL_BASED_OUTPATIENT_CLINIC_OR_DEPARTMENT_OTHER): Payer: Self-pay | Admitting: Internal Medicine

## 2015-10-01 DIAGNOSIS — F331 Major depressive disorder, recurrent, moderate: Principal | ICD-10-CM

## 2015-10-02 ENCOUNTER — Telehealth (HOSPITAL_BASED_OUTPATIENT_CLINIC_OR_DEPARTMENT_OTHER): Payer: Self-pay | Admitting: Registered Nurse

## 2015-10-02 ENCOUNTER — Ambulatory Visit (HOSPITAL_BASED_OUTPATIENT_CLINIC_OR_DEPARTMENT_OTHER): Payer: MEDICARE | Admitting: Social Worker

## 2015-10-02 DIAGNOSIS — F449 Dissociative and conversion disorder, unspecified: Principal | ICD-10-CM

## 2015-10-02 DIAGNOSIS — F331 Major depressive disorder, recurrent, moderate: Secondary | ICD-10-CM

## 2015-10-02 MED ORDER — CITALOPRAM HYDROBROMIDE 10 MG PO TABS
10.0000 mg | ORAL_TABLET | Freq: Every day | ORAL | 0 refills | Status: DC
Start: 2015-10-02 — End: 2015-11-10

## 2015-10-02 NOTE — Progress Notes (Signed)
Pharmacy message re directions for Citalopram 10 mg fwd to Dr Ivin Booty .

## 2015-10-02 NOTE — Progress Notes (Signed)
Brianna KitchenOUTPATIENT PSYCHIATRY PROGRESS NOTE    INTERPRETER: No    CONTACT INFO FOR OTHER AGENCIES AND MENTAL HEALTH PROVIDERS (IF APPLICABLE): Outside therapist [name], Psy.D, [phone]    PROBLEM(S) ADDRESSED IN THIS SESSION:   1. Level of functioning  2. Question of additional care to help with increased sx  3. Strategies for working in therapy      [place] UPDATES IN PATIENT'S WORDS:  1) Chief Complaint (Patient and/or guardian's own words, concerns and expressed thoughts): "I'm looking for someone who can respond caringly in a way I can viscerally internalize... I'm discouraged about getting distracted from affect"    2) New information from patient and/or collateral (Patient's illness: context, course, modifying factors, severity, cultural, family, social, medical history):   Pt was on time for the appointment  To start session with asking how pt is is "triggering" to her because of the multiple layers of anxiety/inability/concern it evokes for her  Pt raises the topic of whether therapist would call [facility] and ask about possibly suitable tx programs, but we don't get a chance to discuss further today - will return to this next appt two days from now  We discuss pt's request to leave a VM when she is experiencing dissociation to help therapist get a better sense of what it's like for pt - we discuss the possible roadblocks to that including 2-minute limitation to VMs and how that might feel - pt feels we're getting intellectualizing and further away from emotional content with this topic, and connects it to earlier experiences of empathic failure from therapist(s)  Therapist trying to foresee problems or roadblocks is 'kind' but not helpful   Pt expressing it is more helpful to her for therapist to say "I don't know here you are/where we are/I'm lost" than to offer "mindreading" or hypotheses as a way of trying to help pt connect with her emotions  "I feel I  ended up without enough of a sense of self, hinging on an inability to feel my feelings and articulate my experiences"      OBJECTIVE  DATA REVIEWED (Consider medical labs, radiology, other medical tests; screening/outcome measures; psychological testing; discussion of test results with other clinicians; consultation with other clinicians and systems involved with patient, summary of old records): [place] (make clear medications prescribed by psychiatry; include OTC medications):    Current Outpatient Prescriptions:  citalopram (CELEXA) 10 MG tablet Take 1 tablet by mouth daily 2 tabs daily. For: depression and anxiety. Disp: 60 tablet Rfl: 0   propranolol (INDERAL) 10 MG tablet Take 1 tablet by mouth 3 (three) times daily as needed For rapid heart rate or anxiety symptoms. Disp: 90 tablet Rfl: 3   THEANINE PO Take  by mouth. Disp:  Rfl:    Multiple Vitamin (MULTIVITAMINS PO)  Disp:  Rfl:      No current facility-administered medications for this visit.     MEDICATION ADHERENCE (including barriers and how addressed):Needs further evaluation    MEDICATION SIDE EFFECTS (Prescribers Only): N/A    BIRTH CONTROL (ask females and males): N/A    CURRENT PREGNANCY: N/A      MENTAL STATUS EXAMINATION                     General Appearance: Groomed white woman wearing weather appropriate clothing, graying shoulder-length hair     Interaction with Interviewer (eye contact, attitude, behavior): Fair to good  eye contact, cooperative, open    Physical Signs    Gait and Station (how patient walks and stands): Within normal limits                  Physical Appearance: Within normal limits          Normal Movements: Within normal limits         Speech (rate, volume, articulation): Within normal limits          Language: Within normal limits                       Mood: "I don't know"            Affect: Congruous with content, variable, tearful at times     Thought Process     Rate; concrete vs abstract  reasoning: Within normal limits        Logical vs illogical; associations: loose, tangential, circumstantial, intact: Pt notes she is/can be mildly tangential at times                                    Thought Content    Normal Thought Content (other than safety): Within normal limits     Perceptions (auditory, visual, tactile, etc.): Within normal limits       Impulse Control: Within normal limits         Cognition (Link to [facility])    Orientation (person, place, time): Within normal limits      Recent and remote memory: Within normal limits           Attention span and concentration: Within normal limits         Fund of knowledge, awareness of current events and vocabulary: Not formally assessed; appear intact      Judgment: Within normal limits          Insight: Moderate to good        Suicidality/Homicidality/Aggression (Victimization or Perpetration): denies/denies/denies     ASSESSMENT   Today's Assessment:  Pt is stable, continues to report a continuous sadness/difficulty functioning. No SI/SHI.    Risk Level Assessment  Risk Level Change (if yes, please describe): No    Suicide: low (1)  Violence: low (1)  Addiction: low (1)          DIAGNOSES ASSESSED TODAY (psychiatric diagnoses and medical diagnoses that factor into management of psychiatric treatment): PTSD, Dissociative symptoms; MDD, recurrent, moderate    CLINICAL FORMULATION (Make changes as your understanding changes. 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 Pt's strengths include intelligence, sense of humor, perseverence.    REVIEWING TODAY'S  VISIT  CLINICAL INTERVENTIONS TODAY:   Gathered information  Offered support  Assessed sx and risk  Discussed above topics  Discussed therapy ruptures and repairs  Continued to discuss therapy frame and methods/interventions that work and don't feel right for pt  Explored and helped pt express emotions that are difficult to access  Discussed level of care appropriate to sx severity    PATIENT'S RESPONSE TO INTERVENTIONS: Pt was engaged and responsive    PROGRESS TOWARDS GOALS: some  TIME SPENT IN PSYCHOTHERAPY: 45 min    PLAN  PLAN FOR MANAGING RISK (Consider risk plan for patients at moderate or high risk for suicide/violence/addiction; medication plan; referrals, etc. Must coincide with treatment plan.): N/A    PLAN FOR ONGOING TREATMENT:    1. Continue twice weekly psychotherapy until sx severity more manageable for pt, then return to once/week   2. Additional referral to group therapy  3. Consult with prescriber and PCP as needed  4. Consider referral to higher level care if appropriate    INFORMED CONSENT (for any new treatment): Patient was informed of the potential risks and benefits of the treatment, including the option not to treat, and appeared to understand and agreed to comply. Discussion included the following key points: N/A    [name], PhD, LICSW

## 2015-10-02 NOTE — Telephone Encounter (Signed)
-----   Message from Holland Falling sent at 10/02/2015 11:17 AM EST -----  Regarding: Confirming prescription directions  [place] [id number], 60 year old, female, Telephone Information:  Home Phone      [phone]  Work Phone      Not on file.  Mobile          [phone]      Patient's Preferred Pharmacy:     [facility], [place].  Phone: [phone] Fax: [phone]      CONFIRMED TODAY: No    CALL BACK NUMBER: [phone]  Best time to call back: anytime  Cell phone:   Other phone:    Available times:    Patient's language of care: English    Patient does not need an interpreter.    Patient's PCP: Tiburcio Bash, MD    Person calling on behalf of patient: Pharmacy    Calls today to speak to nurse only. Would like to speak to a nurse to confirm the directions for the prescription for citalopram (CELEXA) 10 MG tablet.

## 2015-10-02 NOTE — Progress Notes (Signed)
Message was left for PT, to contact [facility] clinic to speak nurse. Please verify medication instructions with her.

## 2015-10-03 ENCOUNTER — Telehealth (HOSPITAL_BASED_OUTPATIENT_CLINIC_OR_DEPARTMENT_OTHER): Payer: Self-pay

## 2015-10-03 NOTE — Telephone Encounter (Signed)
-----   Message from Tiburcio Bash sent at 10/03/2015 12:20 PM EST -----  Regarding: RE: Med directions unclear   Contact: [phone]  2 tablets daily.  Can you call pharmacy pls?  Thank you, nj  ----- Message -----     From: Ezequiel Essex     Sent: 10/03/2015  10:13 AM       To: Tiburcio Bash, Ezequiel Essex  Subject: FW: Med directions unclear                           ----- Message -----     From: Vickey Huger     Sent: 10/03/2015  10:07 AM       To: Tiburcio Bash, [place] [facility]  Subject: Med directions unclear                           Elisea Delcour,  [id number],  60 year old,  female  Telephone Information:  Home Phone      [phone]  Work Phone      Not on file.  Mobile          [phone]    Patient's PCP: Tiburcio Bash, MD    Patient's language of care:   English    Person calling on behalf of patient: Pharmacy /     Calls today received rx script for citalopram. Directions are unclear. Pharmacy would like to know if patient has to take 1 or 2 tablets daily.     CALL BACK NUMBER: [phone]

## 2015-10-03 NOTE — Progress Notes (Signed)
Phone call to pharmacy.  Directions for Citalopram 10 mg per Dr Ivin Booty ( See PCP above message). reviewed with pharmacist .   Directions: 2 tablets daily.

## 2015-10-03 NOTE — Progress Notes (Signed)
Spoke with patient, reviewed my chart message from Dr. Ivin Booty.  Reinforced per provider increase Celexa dose slowly. Take 1 and 1/2 tabs daily (dose of Celexa 15 mg ) for 3-7 days. Update Dr. Ivin Booty through my chart . If tolerating well Dr. Ivin Booty will increase dose to 20 mg daily.    Patient verbalized understanding and agreed with the plan.

## 2015-10-04 ENCOUNTER — Ambulatory Visit (HOSPITAL_BASED_OUTPATIENT_CLINIC_OR_DEPARTMENT_OTHER): Payer: MEDICARE | Admitting: Social Worker

## 2015-10-04 ENCOUNTER — Other Ambulatory Visit (HOSPITAL_BASED_OUTPATIENT_CLINIC_OR_DEPARTMENT_OTHER): Payer: Self-pay | Admitting: Social Worker

## 2015-10-04 DIAGNOSIS — F449 Dissociative and conversion disorder, unspecified: Secondary | ICD-10-CM

## 2015-10-04 DIAGNOSIS — F331 Major depressive disorder, recurrent, moderate: Secondary | ICD-10-CM

## 2015-10-04 NOTE — Progress Notes (Signed)
.   [facility] about possible higher level care programs for pt. Talked to admissions coordinator who said pt doesn't appear to fill criteria for inpatient level care; available options are residential tx at [facility], which is not fully covered by pt's insurance and would have an out-of-pocket cost of $200/night for a minimum of two weeks, or a day treatment for two weeks if pt is able to get on the [place]. Both are group-based and focus on DBT skills; waitlist for day treatment is currently four weeks. Will relay this information to pt and discuss her choices.

## 2015-10-04 NOTE — Progress Notes (Signed)
Marland KitchenOUTPATIENT PSYCHIATRY PROGRESS NOTE    INTERPRETER: No    CONTACT INFO FOR OTHER AGENCIES AND MENTAL HEALTH PROVIDERS (IF APPLICABLE): Outside therapist [name], Psy.D, [phone]    PROBLEM(S) ADDRESSED IN THIS SESSION:   1. Level of functioning  2. Question of day tx/residential level care  3. Strategies for working in therapy and past therapy experiences  4. Pt's felt lack of emotion regulation      SUBJECTIVE  TODAY'S CHIEF COMPLAINT AND CLINICAL UPDATES IN PATIENT'S WORDS:  1) Chief Complaint (Patient and/or guardian's own words, concerns and expressed thoughts): "I need the other to be the Big Person and not get caught in the enactment, because that is retraumatizing for me and traumatizing for them"    2) New information from patient and/or collateral (Patient's illness: context, course, modifying factors, severity, cultural, family, social, medical history):   Pt was on time for the appointment  As agreed that pt can do, she left two VM messages between sessions describing she had been experiencing flashbacks and trying to convey some sense of them - in the second mentioning intense facial expressions having to do with sadness - pt appeared okay with the understanding that therapist will not be calling back after voicemails as a default, but these will be used as material to discuss within sessions  Discussed pt's sense of being blamed for enactments in past therapies where her sense is that the therapists got drawn into enactments instead of being able to provide a different response/experience for pt, not knowing how to be "the Big Person"  Discussed therapist calling [facility] and inquiring about what programs could be a fit for pt; pt also inquiring about "psychophysiological therapy programs at [facility]" - therapist will investigate - pt feels she needs some form of a psychophysiological adjunct tx to help her metabolize and deal with her emotions  When approaching feeling sadness, pt says  she'd rather not go there with only 15 minutes left of the session - says would like to try to "sit with emotion" starting from the beginning of a session, and needs therapist to tell her 15 minutes before the end that we are nearing end of session so she can let herself get back to more rational/less emotional frame of mind - we agree to do this    OBJECTIVE  DATA REVIEWED (Consider medical labs, radiology, other medical tests; screening/outcome measures; psychological testing; discussion of test results with other clinicians; consultation with other clinicians and systems involved with patient, summary of old records): [place] (make clear medications prescribed by psychiatry; include OTC medications):    Current Outpatient Prescriptions:  citalopram (CELEXA) 10 MG tablet Take 1 tablet by mouth daily 2 tabs daily. For: depression and anxiety. Disp: 60 tablet Rfl: 0   propranolol (INDERAL) 10 MG tablet Take 1 tablet by mouth 3 (three) times daily as needed For rapid heart rate or anxiety symptoms. Disp: 90 tablet Rfl: 3   THEANINE PO Take  by mouth. Disp:  Rfl:    Multiple Vitamin (MULTIVITAMINS PO)  Disp:  Rfl:      No current facility-administered medications for this visit.     MEDICATION ADHERENCE (including barriers and how addressed):Needs further evaluation    MEDICATION SIDE EFFECTS (Prescribers Only): N/A    BIRTH CONTROL (ask females and males): N/A    CURRENT PREGNANCY: N/A      MENTAL STATUS EXAMINATION  General Appearance: Groomed white woman wearing weather appropriate clothing, graying shoulder-length hair     Interaction with Interviewer (eye contact, attitude, behavior): Fair to good eye contact, ambivalently/carefully cooperative, open - some humour    Physical Signs    Gait and Station (how patient walks and stands): Within normal limits                  Physical Appearance: Within normal limits          Normal Movements: Within normal  limits         Speech (rate, volume, articulation): Within normal limits          Language: Within normal limits                       Mood: "hard to say"            Affect: Congruous with content, variable, not particularly tearful     Thought Process     Rate; concrete vs abstract reasoning: Within normal limits        Logical vs illogical; associations: loose, tangential, circumstantial, intact: Pt notes she is/can be mildly tangential at times                                    Thought Content    Normal Thought Content (other than safety): Within normal limits     Perceptions (auditory, visual, tactile, etc.): Within normal limits       Impulse Control: Within normal limits         Cognition (Link to [facility])    Orientation (person, place, time): Within normal limits      Recent and remote memory: Within normal limits           Attention span and concentration: Within normal limits         Fund of knowledge, awareness of current events and vocabulary: Not formally assessed; appear intact      Judgment: Within normal limits          Insight: Moderate to good        Suicidality/Homicidality/Aggression (Victimization or Perpetration): denies/denies/denies     ASSESSMENT   Today's Assessment:  Pt is stable, continues to report a continuous sadness/difficulty functioning as well as flashbacks. No SI/SHI.    Risk Level Assessment  Risk Level Change (if yes, please describe): No    Suicide: low (1)  Violence: low (1)  Addiction: low (1)          DIAGNOSES ASSESSED TODAY (psychiatric diagnoses and medical diagnoses that factor into management of psychiatric treatment): PTSD, Dissociative disorder NOS; MDD, recurrent, moderate    CLINICAL FORMULATION (Make changes as your understanding changes. 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 Pt's strengths include intelligence, sense of humor, perseverence.    REVIEWING TODAY'S VISIT  CLINICAL INTERVENTIONS TODAY:   Gathered information  Offered support  Assessed sx and risk  Discussed above topics  Discussed therapy ruptures and repairs  Continued to discuss therapy frame and methods/interventions that work and don't feel right for pt  Explored and helped pt  express emotions that are difficult to access  Discussed level of care appropriate to sx severity - agreed therapist will call [facility] about higher level of care    PATIENT'S RESPONSE TO INTERVENTIONS: Pt was engaged and responsive    PROGRESS TOWARDS GOALS: moderate    TIME SPENT IN PSYCHOTHERAPY: 45 min    PLAN  PLAN FOR MANAGING RISK (Consider risk plan for patients at moderate or high risk for suicide/violence/addiction; medication plan; referrals, etc. Must coincide with treatment plan.): N/A    PLAN FOR ONGOING TREATMENT:    1. Continue twice weekly psychotherapy until sx severity more manageable for pt, then return to once/week   2. Additional referral to group therapy  3. Consult with prescriber and PCP as needed  4. Consult with [facility] admission coordinator about higher level of care    INFORMED CONSENT (for any new treatment): Patient was informed of the potential risks and benefits of the treatment, including the option not to treat, and appeared to understand and agreed to comply. Discussion included the following key points: N/A    [name], PhD, LICSW

## 2015-10-09 ENCOUNTER — Ambulatory Visit (HOSPITAL_BASED_OUTPATIENT_CLINIC_OR_DEPARTMENT_OTHER): Payer: MEDICARE | Admitting: Social Worker

## 2015-10-09 DIAGNOSIS — F331 Major depressive disorder, recurrent, moderate: Secondary | ICD-10-CM

## 2015-10-09 DIAGNOSIS — F449 Dissociative and conversion disorder, unspecified: Secondary | ICD-10-CM

## 2015-10-09 NOTE — Progress Notes (Signed)
Marland KitchenOUTPATIENT PSYCHIATRY PROGRESS NOTE    INTERPRETER: No    CONTACT INFO FOR OTHER AGENCIES AND MENTAL HEALTH PROVIDERS (IF APPLICABLE): Outside therapist [name], Psy.D, [phone]    PROBLEM(S) ADDRESSED IN THIS SESSION:   1. Level of functioning  2. Question of day tx/residential level care  3. Strategies for working in therapy and past therapy experiences  4. Pt's experienced lack of emotion regulation      SUBJECTIVE  TODAY'S CHIEF COMPLAINT AND CLINICAL UPDATES IN PATIENT'S WORDS:  1) Chief Complaint (Patient and/or guardian's own words, concerns and expressed thoughts): "I need you to stay with the emotion and be able to repair and return to it when there's misattunement"    2) New information from patient and/or collateral (Patient's illness: context, course, modifying factors, severity, cultural, family, social, medical history):   Pt was on time for the appointment  No VMs left by pt between last session and today  Pt says she's clear that day tx program at [facility] is her best option of the ones available, but that she's not clear what would be best for her - has concerns about [facility] not being to help either, especially if they don't offer "something" specifically for flashbacks - having a four-week waiting list might be beneficial for pt so she can have time to think about this  Ideally, pt wishes she could see an individual therapist even more frequently than twice/week  Deeper discussion on how to proceed tabled until future sessions as pt needs to focus on her psychic suffering in today's session - pt is able to describe both a momentary sense of feeling that therapist is attuned to her as she's experiencing sadness and further then feeling like therapist get away from/ is misattuned to pt   Pt more able to express today the depth of her experiences of psychic suffering and feelings of 'agony', 'abject pain' and loneliness  We discuss pt's need for an individualized tx and what might get in the  way of therapists and pt not being able to work in this way   Pt says at one point she continues to feel encouraged about working w/t/provider even though she keeps experiencing misattunement    OBJECTIVE  DATA REVIEWED (Consider medical labs, radiology, other medical tests; screening/outcome measures; psychological testing; discussion of test results with other clinicians; consultation with other clinicians and systems involved with patient, summary of old records): Consulted EMR    CURRENT MEDICATIONS (make clear medications prescribed by psychiatry; include OTC medications):    Current Outpatient Prescriptions:  citalopram (CELEXA) 10 MG tablet Take 1 tablet by mouth daily 2 tabs daily. For: depression and anxiety. Disp: 60 tablet Rfl: 0   propranolol (INDERAL) 10 MG tablet Take 1 tablet by mouth 3 (three) times daily as needed For rapid heart rate or anxiety symptoms. Disp: 90 tablet Rfl: 3   THEANINE PO Take  by mouth. Disp:  Rfl:    Multiple Vitamin (MULTIVITAMINS PO)  Disp:  Rfl:      No current facility-administered medications for this visit.     MEDICATION ADHERENCE (including barriers and how addressed):Needs further evaluation    MEDICATION SIDE EFFECTS (Prescribers Only): N/A    BIRTH CONTROL (ask females and males): N/A    CURRENT PREGNANCY: N/A      MENTAL STATUS EXAMINATION                     General Appearance: Groomed white woman wearing weather appropriate clothing,  graying shoulder-length hair     Interaction with Interviewer (eye contact, attitude, behavior): Fair to good eye contact, more vulnerable, open - some humour    Physical Signs    Gait and Station (how patient walks and stands): Within normal limits                  Physical Appearance: Within normal limits          Normal Movements: Within normal limits         Speech (rate, volume, articulation): Within normal limits          Language: Within normal limits                       Mood: "in agony"            Affect: Congruous with  content, variable, tearful at times    Thought Process     Rate; concrete vs abstract reasoning: Within normal limits        Logical vs illogical; associations: loose, tangential, circumstantial, intact: Pt notes she is/can be mildly tangential at times                                    Thought Content    Normal Thought Content (other than safety): Within normal limits     Perceptions (auditory, visual, tactile, etc.): Within normal limits       Impulse Control: Within normal limits         Cognition (Link to [facility])    Orientation (person, place, time): Within normal limits      Recent and remote memory: Within normal limits           Attention span and concentration: Within normal limits         Fund of knowledge, awareness of current events and vocabulary: Not formally assessed; appear intact      Judgment: Within normal limits          Insight: Moderate to good        Suicidality/Homicidality/Aggression (Victimization or Perpetration): denies/denies/denies     ASSESSMENT   Today's Assessment:  Pt is stable, continues to report a continuous sadness/difficulty functioning as well as flashbacks. No SI/SHI.    Risk Level Assessment  Risk Level Change (if yes, please describe): No    Suicide: low (1)  Violence: low (1)  Addiction: low (1)          DIAGNOSES ASSESSED TODAY (psychiatric diagnoses and medical diagnoses that factor into management of psychiatric treatment): PTSD, Dissociative disorder NOS; MDD, recurrent, moderate    CLINICAL FORMULATION (Make changes as your understanding changes. 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 Pt's strengths include intelligence, sense of humor, perseverence.    REVIEWING TODAY'S VISIT  CLINICAL INTERVENTIONS TODAY:   Gathered information  Offered support  Assessed sx and risk  Discussed above topics  Discussed therapy ruptures and repairs  Continued to discuss therapy frame and methods/interventions that work and don't feel right for pt  Explored and helped pt express emotions that are difficult to access  Discussed level of  care appropriate to sx severity; specifically [facility] day program and self-compassion group     PATIENT'S RESPONSE TO INTERVENTIONS: Pt was engaged and responsive    PROGRESS TOWARDS GOALS: moderate    TIME SPENT IN PSYCHOTHERAPY: 45 min    PLAN  PLAN FOR MANAGING RISK (Consider risk plan for patients at moderate or high risk for suicide/violence/addiction; medication plan; referrals, etc. Must coincide with treatment plan.): N/A    PLAN FOR ONGOING TREATMENT:    1. Continue twice weekly psychotherapy until sx severity more manageable for pt, then return to once/week   2. Additional referral to group therapy made  3. Consult with prescriber and PCP as needed  4. Possible referral to [facility] day tx program     INFORMED CONSENT (for any new treatment): Patient was informed of the potential risks and benefits of the treatment, including the option not to treat, and appeared to understand and agreed to comply. Discussion included the following key points: N/A    [name], PhD, LICSW

## 2015-10-10 ENCOUNTER — Ambulatory Visit (HOSPITAL_BASED_OUTPATIENT_CLINIC_OR_DEPARTMENT_OTHER): Payer: MEDICARE | Admitting: Clinical

## 2015-10-10 DIAGNOSIS — F331 Major depressive disorder, recurrent, moderate: Principal | ICD-10-CM

## 2015-10-10 NOTE — Progress Notes (Signed)
.  OUTPATIENT PSYCHIATRY GROUP PROGRESS NOTE    Patient Name: Brianna [name]    Group Name: mindful self compassion    Leaders: Iowa Kappes    Service Type: [phone] Group Psychotherapy     Length of Group: 75 minutes     Number of participants in group today: 6        Purpose of Group (choose all that apply):   Symptom relief  Affect regulation  Interpersonal skill development  Support (psychological, family, community resources)  Insight and behavior change         Group Process:  Group #!: Guidelines, Overview, Self Compassion questionnaire    Individual Patient Participation:    Attentive to process  Active participant  Asked appropriate questions  Contributed constructively  Engaged in topic  Identified with peers  Shared personal experience related to subject addressed in group  Supportive of other members    Diagnosis (addressed by this group):  PTSD    Medical Necessity of Session (how treatment is necessary to improve symptoms, functioning, or prevent worsening): symptom reduction    Relevant Changes in Mental Status: No.     Risk Level per Scale:  Suicide: low  Violence: low  Addiction: low    Current risk level represents increase in risk: No.                                                   [name], LICSW                                  10/10/2015

## 2015-10-11 ENCOUNTER — Ambulatory Visit (HOSPITAL_BASED_OUTPATIENT_CLINIC_OR_DEPARTMENT_OTHER): Payer: MEDICARE | Admitting: Social Worker

## 2015-10-11 DIAGNOSIS — F4310 Post-traumatic stress disorder, unspecified: Secondary | ICD-10-CM

## 2015-10-11 DIAGNOSIS — F449 Dissociative and conversion disorder, unspecified: Secondary | ICD-10-CM
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
## 2015-10-16 ENCOUNTER — Ambulatory Visit (HOSPITAL_BASED_OUTPATIENT_CLINIC_OR_DEPARTMENT_OTHER): Payer: MEDICARE | Admitting: Social Worker

## 2015-10-16 DIAGNOSIS — F449 Dissociative and conversion disorder, unspecified: Principal | ICD-10-CM

## 2015-10-16 NOTE — Progress Notes (Signed)
Marland KitchenOUTPATIENT PSYCHIATRY PROGRESS NOTE    INTERPRETER: No    CONTACT INFO FOR OTHER AGENCIES AND MENTAL HEALTH PROVIDERS (IF APPLICABLE): Outside therapist [name], Psy.D, [phone]    PROBLEM(S) ADDRESSED IN THIS SESSION:   1. Level of functioning, flashbacks, dissociative behaviors  2. Psychophysiological adjunctive tx  3. Strategies for working in therapy and past therapy experiences      SUBJECTIVE  TODAY'S CHIEF COMPLAINT AND CLINICAL UPDATES IN PATIENT'S WORDS:  1) Chief Complaint (Patient and/or guardian's own words, concerns and expressed thoughts): "How would you respond if I showed you on my face the extremely sad expressions I make that i think are connected to dissociation?"    2) New information from patient and/or collateral (Patient's illness: context, course, modifying factors, severity, cultural, family, social, medical history):   Pt was on time for the appointment  No VMs left by pt between last session and today  Today, pt doesn't want to work on Pensions consultant; says could work on goals that she needs to have for the application between sessions and then bring/email them to me for discussion  Explained to pt that neurofeedback might be available at [facility], but the provider who might be available to do this is someone pt has worked with in the past with some issues - pt wants to think about this some more  Pt wants to focus on bringing in the room her experience of "making faces"; pointedly sad facial expressions she makes during her dissociative experiences - unsure how provider will respond and reiterates she needs words of empathy or she will feel not helped - after about 5 minutes of this, she clarifies that outward descriptions by provider of what provider can notice on her face/body are not helpful to her, and can even intensify the pain she is feeling, rather she feels only reached by very direct statements such as "you are in pain" (vs. "your face looks very sad")  We also discuss  briefly t/provider's phone consultation with previous therapist Cristal Ford and the way it reinforced the importance of working in an individualized way w/patient    OBJECTIVE  DATA REVIEWED (Consider medical labs, radiology, other medical tests; screening/outcome measures; psychological testing; discussion of test results with other clinicians; consultation with other clinicians and systems involved with patient, summary of old records): [place] (make clear medications prescribed by psychiatry; include OTC medications):    Current Outpatient Prescriptions:  citalopram (CELEXA) 10 MG tablet Take 1 tablet by mouth daily 2 tabs daily. For: depression and anxiety. Disp: 60 tablet Rfl: 0   propranolol (INDERAL) 10 MG tablet Take 1 tablet by mouth 3 (three) times daily as needed For rapid heart rate or anxiety symptoms. Disp: 90 tablet Rfl: 3   THEANINE PO Take  by mouth. Disp:  Rfl:    Multiple Vitamin (MULTIVITAMINS PO)  Disp:  Rfl:      No current facility-administered medications for this visit.     MEDICATION ADHERENCE (including barriers and how addressed):Needs further evaluation    MEDICATION SIDE EFFECTS (Prescribers Only): N/A    BIRTH CONTROL (ask females and males): N/A    CURRENT PREGNANCY: N/A      MENTAL STATUS EXAMINATION                     General Appearance: Groomed white woman wearing weather appropriate clothing, graying shoulder-length hair     Interaction with Interviewer (eye contact, attitude, behavior): Fair to  good eye contact, open - fairly corrective of therapist's misinterpretations or misattunement    Physical Signs    Gait and Station (how patient walks and stands): Within normal limits                  Physical Appearance: Within normal limits          Normal Movements: Within normal limits         Speech (rate, volume, articulation): Within normal limits          Language: Within normal limits                       Mood: "teetering on the  edge"            Affect: Congruous with content, variable, tearful at times    Thought Process     Rate; concrete vs abstract reasoning: Within normal limits        Logical vs illogical; associations: loose, tangential, circumstantial, intact: Pt notes she is/can be mildly tangential at times                                    Thought Content    Normal Thought Content (other than safety): Within normal limits     Perceptions (auditory, visual, tactile, etc.): Within normal limits       Impulse Control: Within normal limits         Cognition (Link to [facility])    Orientation (person, place, time): Within normal limits      Recent and remote memory: Within normal limits           Attention span and concentration: Within normal limits         Fund of knowledge, awareness of current events and vocabulary: Not formally assessed; appear intact      Judgment: Within normal limits          Insight: Moderate to good        Suicidality/Homicidality/Aggression (Victimization or Perpetration): denies/denies/denies     ASSESSMENT   Today's Assessment:  Pt is not at risk of harming herself or others, but continues to feel need for increased support.     Risk Level Assessment  Risk Level Change (if yes, please describe): No    Suicide: low (1)  Violence: low (1)  Addiction: low (1)          DIAGNOSES ASSESSED TODAY (psychiatric diagnoses and medical diagnoses that factor into management of psychiatric treatment): PTSD, Dissociative disorder NOS; MDD, recurrent, moderate    CLINICAL FORMULATION (Make changes as your understanding changes. 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 Pt's strengths include intelligence, sense of humor, perseverence.    REVIEWING TODAY'S VISIT  CLINICAL INTERVENTIONS TODAY:   Gathered information  Offered support  Assessed sx and risk  Discussed above topics  Discussed therapy ruptures and repairs  Continued to discuss therapy frame and methods/interventions that work and don't feel right for pt  Explored and helped pt express emotions that are difficult to access in order to help pt to metabolize and build connections hampered by  early trauma  Discussed level of care appropriate to sx severity; specifically psychophysiological tx adjuncts and frequency of individual therapy    PATIENT'S RESPONSE TO INTERVENTIONS: Pt was engaged and responsive    PROGRESS TOWARDS GOALS: some    TIME SPENT IN PSYCHOTHERAPY: 45 min    PLAN  PLAN FOR MANAGING RISK (Consider risk plan for patients at moderate or high risk for suicide/violence/addiction; medication plan; referrals, etc. Must coincide with treatment plan.): N/A    PLAN FOR ONGOING TREATMENT:    1. Add an additional weekly psychotherapy session until sx severity more manageable for pt or pt can attend day tx program at [place], then return to lower frequency /week   2. Referral to [place] day tx program  3. Referral to [facility]feedback tx if available at [facility]  4. Continue Self-Compassion group with [name]  5. Referral to psychopharm when pt ready  6. Consult with prescriber and PCP as needed     INFORMED CONSENT (for any new treatment): Patient was informed of the potential risks and benefits of the treatment, including the option not to treat, and appeared to understand and agreed to comply. Discussion included the following key points: N/A    [name], PhD, LICSW

## 2015-10-17 ENCOUNTER — Ambulatory Visit (HOSPITAL_BASED_OUTPATIENT_CLINIC_OR_DEPARTMENT_OTHER): Payer: MEDICARE | Admitting: Clinical

## 2015-10-17 ENCOUNTER — Ambulatory Visit (HOSPITAL_BASED_OUTPATIENT_CLINIC_OR_DEPARTMENT_OTHER): Payer: MEDICARE | Admitting: Social Worker

## 2015-10-17 DIAGNOSIS — F449 Dissociative and conversion disorder, unspecified: Secondary | ICD-10-CM

## 2015-10-17 NOTE — Progress Notes (Signed)
Marland KitchenOUTPATIENT PSYCHIATRY PROGRESS NOTE    INTERPRETER: No    CONTACT INFO FOR OTHER AGENCIES AND MENTAL HEALTH PROVIDERS (IF APPLICABLE): Outside therapist [name], Psy.D, [phone]; group therapist [name]    PROBLEM(S) ADDRESSED IN THIS SESSION:   1. Level of functioning, flashbacks, dissociative behaviors  2. Intellectualization vs nonverbalizable experiences of early pain  3. Strategies for working in therapy and past therapy experiences      SUBJECTIVE  TODAY'S CHIEF COMPLAINT AND CLINICAL UPDATES IN PATIENT'S WORDS:  1) Chief Complaint (Patient and/or guardian's own words, concerns and expressed thoughts): "There is no place else [than in therapy] I can go to with my pain and i can't deal with it on my own; on the intellectual level I can function better"    2) New information from patient and/or collateral (Patient's illness: context, course, modifying factors, severity, cultural, family, social, medical history):   Pt was on time for the appointment  No VMs left by pt between last session and today  Pt bringing up differences between [place] Benjamin's understanding of self-states and building a spatial/verbal metaphor/concept for herself and [name] on how the work needs to proceed when (as she believes) there wasn't an opportunity for an early self to get constructed d/t early separation, pain and a "vagal shut-down"  We also discuss pt's experience in last session of going into her pain, and she clarifies she was not "showing" or "demonstrating" her pain but "switched" into that state or non-verbal grief  We discuss the dilemma of her needing the therapist to respond verbally but also of the hurt caused by words that don't reach her - between the dilemma of more silence and words that are wrong, pt says she prefer words that are "wrong" as long as the needed experience of a caring, soothing tone of voice would be present from the therapist  Emphasizing that as she is working hard  on the intellectual level to understand her experiences (multiple psychoanalytic fellowships and classes, a reading tutorial on theory, a discussion group for clinicians and non-clinicians) what she needs most from therapy is a focus on the painful, preverbal experiences      OBJECTIVE  DATA REVIEWED (Consider medical labs, radiology, other medical tests; screening/outcome measures; psychological testing; discussion of test results with other clinicians; consultation with other clinicians and systems involved with patient, summary of old records): [place] (make clear medications prescribed by psychiatry; include OTC medications):    Current Outpatient Prescriptions:  citalopram (CELEXA) 10 MG tablet Take 1 tablet by mouth daily 2 tabs daily. For: depression and anxiety. Disp: 60 tablet Rfl: 0   propranolol (INDERAL) 10 MG tablet Take 1 tablet by mouth 3 (three) times daily as needed For rapid heart rate or anxiety symptoms. Disp: 90 tablet Rfl: 3   THEANINE PO Take  by mouth. Disp:  Rfl:    Multiple Vitamin (MULTIVITAMINS PO)  Disp:  Rfl:      No current facility-administered medications for this visit.     MEDICATION ADHERENCE (including barriers and how addressed):Needs further evaluation    MEDICATION SIDE EFFECTS (Prescribers Only): N/A    BIRTH CONTROL (ask females and males): N/A    CURRENT PREGNANCY: N/A      MENTAL STATUS EXAMINATION                     General Appearance: Groomed white woman wearing weather appropriate clothing, graying shoulder-length hair  Interaction with Interviewer (eye contact, attitude, behavior): Fair to good eye contact, open - more intellectualized/"adult"    Physical Signs    Gait and Station (how patient walks and stands): Within normal limits                  Physical Appearance: Within normal limits          Normal Movements: Within normal limits         Speech (rate, volume, articulation): Within normal limits          Language: Within normal  limits                       Mood: "it's tricky"            Affect: Congruous with content, variabl  Thought Process     Rate; concrete vs abstract reasoning: Within normal limits        Logical vs illogical; associations: loose, tangential, circumstantial, intact: Pt notes she is/can be mildly tangential at times                                    Thought Content    Normal Thought Content (other than safety): Within normal limits     Perceptions (auditory, visual, tactile, etc.): Within normal limits       Impulse Control: Within normal limits         Cognition (Link to [facility])    Orientation (person, place, time): Within normal limits      Recent and remote memory: Within normal limits           Attention span and concentration: Within normal limits         Fund of knowledge, awareness of current events and vocabulary: Not formally assessed; appear intact      Judgment: Within normal limits          Insight: Moderate to good        Suicidality/Homicidality/Aggression (Victimization or Perpetration): denies/denies/denies     ASSESSMENT   Today's Assessment:  Pt is not at risk of harming herself or others, but continues to feel need for increased support with dissociative experiences.     Risk Level Assessment  Risk Level Change (if yes, please describe): No    Suicide: low (1)  Violence: low (1)  Addiction: low (1)          DIAGNOSES ASSESSED TODAY (psychiatric diagnoses and medical diagnoses that factor into management of psychiatric treatment): PTSD, Dissociative disorder NOS; MDD, recurrent, moderate    CLINICAL FORMULATION (Make changes as your understanding changes. 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 Pt's strengths include intelligence, sense of humor, perseverence.    REVIEWING TODAY'S VISIT  CLINICAL INTERVENTIONS TODAY:   Gathered information  Offered support  Assessed sx and risk  Discussed above topics  Discussed therapy ruptures and repairs  Continued to discuss therapy frame and methods/interventions that work and don't feel right for pt  Explored and helped pt express emotions that are difficult to access in order to help pt to metabolize  and build connections hampered by early trauma  Discussed level of care appropriate to sx severity; specifically psychophysiological tx adjuncts and frequency of individual therapy    PATIENT'S RESPONSE TO INTERVENTIONS: Pt was engaged and responsive    PROGRESS TOWARDS GOALS: some    TIME SPENT IN PSYCHOTHERAPY: 45 min    PLAN  PLAN FOR MANAGING RISK (Consider risk plan for patients at moderate or high risk for suicide/violence/addiction; medication plan; referrals, etc. Must coincide with treatment plan.): N/A    PLAN FOR ONGOING TREATMENT:    1. Add an additional weekly psychotherapy session until sx severity more manageable for pt or pt can attend day tx program at [place], then return to lower frequency /week   2. Referral to [place] day tx program  3. Referral to [facility]feedback tx if available at [facility]  4. Continue Self-Compassion group with [name]  5. Referral to psychopharm when pt ready  6. Consult with prescriber and PCP as needed     INFORMED CONSENT (for any new treatment): Patient was informed of the potential risks and benefits of the treatment, including the option not to treat, and appeared to understand and agreed to comply. Discussion included the following key points: N/A    [name], PhD, LICSW

## 2015-10-18 ENCOUNTER — Ambulatory Visit (HOSPITAL_BASED_OUTPATIENT_CLINIC_OR_DEPARTMENT_OTHER): Payer: MEDICARE | Admitting: Social Worker

## 2015-10-18 DIAGNOSIS — F449 Dissociative and conversion disorder, unspecified: Secondary | ICD-10-CM

## 2015-10-18 NOTE — Progress Notes (Signed)
Brianna KitchenOUTPATIENT PSYCHIATRY PROGRESS NOTE    INTERPRETER: No    CONTACT INFO FOR OTHER AGENCIES AND MENTAL HEALTH PROVIDERS (IF APPLICABLE): Outside trauma-specialist therapist [name], Psy.D, [phone]; group therapist [name]    PROBLEM(S) ADDRESSED IN THIS SESSION:   1. Level of functioning, flashbacks, dissociative behaviors  2. Intellectualization vs nonverbalizable experiences of early pain  3. Strategies for working in therapy and past therapy experiences      SUBJECTIVE  TODAY'S CHIEF COMPLAINT AND CLINICAL UPDATES IN PATIENT'S WORDS:  1) Chief Complaint (Patient and/or guardian's own words, concerns and expressed thoughts): "I think I can wait and see a little longer on Brianna [name].Brianna KitchenMarland KitchenIn general, I don't feel such a pressing desperation as I did when we started meeting, though I have to admit I'd like to see a big change ASAP"    2) New information from patient and/or collateral (Patient's illness: context, course, modifying factors, severity, cultural, family, social, medical history):   Pt was on time for the appointment  No VMs left by pt between last session and today  Pt says she appreciates the extra meeting this week and feels it's been helpful for her to feel the therapist "reach out" in a way that she feels is important in early mother-child interaction (for the "adult" to show some effort in understanding/helping the baby understand and manage its emotions)  Feels less urgently in pain and thinks she doesn't need to apply to the [facility] day program at the moment, but keep it as an option - same goes with neurofeedback  Talking for the first time about what her life looks like outside of therapy - feels she has periods of "collapse" as a compensation for having had to "pretend she's an ordinary person"  This looks like: not having friends or a social life, letting papers be disorganized, forgetting to go shopping, laundry & dishes piling up - pt does differentiate this from her understanding of  what a depressive episode is like; ie doesn't think these "compensatory collapses" are depression per se    "The only thing I do is read papers on [employer] [psychoanalytic journal articles]"  Pt doesn't enjoy fiction because feels it has no contact with her experiences "If I have words at all to use talking to you, it's because of [employer]" - also does reading seminar once a month with an analyst  Pt explains once more "lumping/splitting" (not in psychological sense) - comes from anthropology class and decribes an attitude to categorizing that focuses either on similarities and generalizations ("lumping") or on parsing out differences and nuances ("splitting")    OBJECTIVE  DATA REVIEWED (Consider medical labs, radiology, other medical tests; screening/outcome measures; psychological testing; discussion of test results with other clinicians; consultation with other clinicians and systems involved with patient, summary of old records): Consulted EMR    CURRENT MEDICATIONS (make clear medications prescribed by psychiatry; include OTC medications):    Current Outpatient Prescriptions:  citalopram (CELEXA) 10 MG tablet Take 1 tablet by mouth daily 2 tabs daily. For: depression and anxiety. Disp: 60 tablet Rfl: 0   propranolol (INDERAL) 10 MG tablet Take 1 tablet by mouth 3 (three) times daily as needed For rapid heart rate or anxiety symptoms. Disp: 90 tablet Rfl: 3   THEANINE PO Take  by mouth. Disp:  Rfl:    Multiple Vitamin (MULTIVITAMINS PO)  Disp:  Rfl:      No current facility-administered medications for this visit.     MEDICATION ADHERENCE (including barriers and how  addressed):Needs further evaluation    MEDICATION SIDE EFFECTS (Prescribers Only): N/A    BIRTH CONTROL (ask females and males): N/A    CURRENT PREGNANCY: N/A      MENTAL STATUS EXAMINATION                     General Appearance: Groomed white woman wearing weather appropriate clothing, graying shoulder-length hair     Interaction with Interviewer (eye  contact, attitude, behavior): Fair to good eye contact, open - more intellectualized/"adult" again    Physical Signs    Gait and Station (how patient walks and stands): Within normal limits                  Physical Appearance: Within normal limits          Normal Movements: Within normal limits         Speech (rate, volume, articulation): Within normal limits          Language: Within normal limits                       Mood: "a little less desperate"            Affect: Congruous with content, variable, tears in eyes at times    Thought Process     Rate; concrete vs abstract reasoning: Within normal limits        Logical vs illogical; associations: loose, tangential, circumstantial, intact: Pt notes she is/can be mildly tangential at times                                    Thought Content    Normal Thought Content (other than safety): Within normal limits     Perceptions (auditory, visual, tactile, etc.): Within normal limits       Impulse Control: Within normal limits         Cognition (Link to [facility])    Orientation (person, place, time): Within normal limits      Recent and remote memory: Within normal limits           Attention span and concentration: Within normal limits         Fund of knowledge, awareness of current events and vocabulary: Not formally assessed; appear intact      Judgment: Within normal limits          Insight: Moderate to good        Suicidality/Homicidality/Aggression (Victimization or Perpetration): denies/denies/denies     ASSESSMENT   Today's Assessment:  Pt is not at risk of harming herself or others, but continues to feel need for increased support with dissociative experiences. Would prefer more intensive individual tx to partial hospitalization at this time     Risk Level Assessment  Risk Level Change (if yes, please describe): No    Suicide: low (1)  Violence: low (1)  Addiction: low (1)          DIAGNOSES ASSESSED TODAY (psychiatric diagnoses and medical diagnoses that factor into  management of psychiatric treatment): PTSD, Dissociative disorder NOS; MDD, recurrent, moderate    CLINICAL FORMULATION (Make changes as your understanding changes. 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 Pt's strengths include intelligence, sense of humor, perseverence.    REVIEWING TODAY'S VISIT  CLINICAL INTERVENTIONS TODAY:   Gathered information  Offered support  Assessed sx and risk  Discussed above topics  Discussed therapy ruptures and repairs  Explored methods/interventions that work and don't feel right for pt  Explored and helped pt express emotions that are difficult to access in order to help pt to metabolize and build connections hampered by early trauma  Discussed level of care appropriate to sx severity      PATIENT'S RESPONSE TO INTERVENTIONS: Pt was engaged and responsive    PROGRESS TOWARDS GOALS: some    TIME SPENT IN PSYCHOTHERAPY: 45 min    PLAN  PLAN FOR MANAGING RISK (Consider risk plan for patients at moderate or high risk for suicide/violence/addiction; medication plan; referrals, etc. Must coincide with treatment plan.): N/A    PLAN FOR ONGOING TREATMENT:    1. Add an additional weekly psychotherapy session until sx severity more manageable for pt or pt can attend day tx program at [place], then return to lower frequency /week   2. Referral to [place] day tx program if appropriate  3. Referral to [facility]feedback tx if available at [facility]  4. Continue  Self-Compassion group with [name]  5. Referral to psychopharm when pt ready  6. Consult with prescriber and PCP as needed     INFORMED CONSENT (for any new treatment): Patient was informed of the potential risks and benefits of the treatment, including the option not to treat, and appeared to understand and agreed to comply. Discussion included the following key points: N/A    [name], PhD, LICSW

## 2015-10-23 ENCOUNTER — Ambulatory Visit (HOSPITAL_BASED_OUTPATIENT_CLINIC_OR_DEPARTMENT_OTHER): Payer: MEDICARE | Admitting: Social Worker

## 2015-10-24 ENCOUNTER — Ambulatory Visit (HOSPITAL_BASED_OUTPATIENT_CLINIC_OR_DEPARTMENT_OTHER): Payer: MEDICARE | Admitting: Social Worker

## 2015-10-24 ENCOUNTER — Ambulatory Visit (HOSPITAL_BASED_OUTPATIENT_CLINIC_OR_DEPARTMENT_OTHER): Payer: MEDICARE | Admitting: Clinical

## 2015-10-24 DIAGNOSIS — F449 Dissociative and conversion disorder, unspecified: Principal | ICD-10-CM

## 2015-10-24 DIAGNOSIS — F4312 Post-traumatic stress disorder, chronic: Secondary | ICD-10-CM

## 2015-10-24 NOTE — Progress Notes (Signed)
OUTPATIENT PSYCHIATRY GROUP PROGRESS NOTE    Patient Name: Brianna [name]    Group Name: mindful self compassion    Leaders: Kanesha Cadle    Service Type: [phone] Group Psychotherapy     Length of Group: 75 minutes     Number of participants in group today: 5        Purpose of Group (choose all that apply):   Symptom relief  Affect regulation  Interpersonal skill development  Support (psychological, family, community resources)  Insight and behavior change         Group Process:  Group #3: mindfulness review; self soothing, mindfulness of thougths    Individual Patient Participation:    Attentive to process  Identified with peers  Minimal participation  Seemed distracted  Shared personal experience related to subject addressed in group  Supportive of other members  Acknowledged mistake of sleeping through last week's group.    Diagnosis (addressed by this group):  PTSD    Medical Necessity of Session (how treatment is necessary to improve symptoms, functioning, or prevent worsening): increase self awareness and tools for self compassion    Relevant Changes in Mental Status: No.     Risk Level per Scale:  Suicide: low  Violence: low  Addiction: low    Current risk level represents increase in risk: No.                                                   [name], LICSW                                  10/24/2015

## 2015-10-24 NOTE — Progress Notes (Signed)
[facility] KitchenOUTPATIENT PSYCHIATRY PROGRESS NOTE    INTERPRETER: No    CONTACT INFO FOR OTHER AGENCIES AND MENTAL HEALTH PROVIDERS (IF APPLICABLE): Outside trauma-specialist therapist [name], Psy.D, [phone]; group therapist [name]    PROBLEM(S) ADDRESSED IN THIS SESSION:   1. Level of functioning, flashbacks, dissociative behaviors  2. Intellectualization vs nonverbalizable experiences of early pain  3. Strategies for working in therapy and past therapy experiences      SUBJECTIVE  TODAY'S CHIEF COMPLAINT AND CLINICAL UPDATES IN PATIENT'S WORDS:  1) Chief Complaint (Patient and/or guardian's own words, concerns and expressed thoughts): "I was feeling both sad and angry... I think it would be important for you to try to find words even if you don't know what to say"    2) New information from patient and/or collateral (Patient's illness: context, course, modifying factors, severity, cultural, family, social, medical history):   Pt was a few minutes late for the appointment (icy pavements)  No VMs left by pt between last session and today; resched from yesterday d/t to clinic closure for weather  As discussed, pt wants to try to start session with "going into" her unexpressable experience that is demonstrated by facial expressions - this doesn't go quite as well as pt had hoped, as she experiences therapist's response as not trying enough and not seeing part of it "I think I was feeling both sad and angry" "I wish you would take more risks" "Maybe you could say "I'm not with you yet, but I see you are sad"  Also using a whispering voice, which when asked about brings pt to elaborate more about her childhood (for the first time) including experiences in nursery school and fifth grade, an in which way these were perhaps compensatory to some extent but not enough for pt to build (in her mind) a coherent sense of self  Pt also notes that she is not suffering from [neurotic level sx such as] self-esteem issues but of  more profound levels of attachment disturbance    OBJECTIVE  DATA REVIEWED (Consider medical labs, radiology, other medical tests; screening/outcome measures; psychological testing; discussion of test results with other clinicians; consultation with other clinicians and systems involved with patient, summary of old records): Consulted EMR    CURRENT MEDICATIONS (make clear medications prescribed by psychiatry; include OTC medications):    Current Outpatient Prescriptions:  citalopram (CELEXA) 10 MG tablet Take 1 tablet by mouth daily 2 tabs daily. For: depression and anxiety. Disp: 60 tablet Rfl: 0   propranolol (INDERAL) 10 MG tablet Take 1 tablet by mouth 3 (three) times daily as needed For rapid heart rate or anxiety symptoms. Disp: 90 tablet Rfl: 3   THEANINE PO Take  by mouth. Disp:  Rfl:    Multiple Vitamin (MULTIVITAMINS PO)  Disp:  Rfl:      No current facility-administered medications for this visit.     MEDICATION ADHERENCE (including barriers and how addressed):Needs further evaluation    MEDICATION SIDE EFFECTS (Prescribers Only): N/A    BIRTH CONTROL (ask females and males): N/A    CURRENT PREGNANCY: N/A      MENTAL STATUS EXAMINATION                     General Appearance: Groomed white woman wearing weather appropriate clothing, graying shoulder-length hair     Interaction with Interviewer (eye contact, attitude, behavior): Fair to good eye contact, open - more intellectualized/"adult" as well as "more young"    Physical Signs  Gait and Station (how patient walks and stands): Within normal limits                  Physical Appearance: Within normal limits          Normal Movements: Within normal limits         Speech (rate, volume, articulation): Within normal limits          Language: Within normal limits                       Mood: "sad and angry"            Affect: Congruous with content, variable    Thought Process     Rate; concrete vs abstract reasoning: Within normal limits        Logical vs  illogical; associations: loose, tangential, circumstantial, intact: Pt notes she is/can be mildly tangential at times                                    Thought Content    Normal Thought Content (other than safety): Within normal limits     Perceptions (auditory, visual, tactile, etc.): Within normal limits       Impulse Control: Within normal limits         Cognition (Link to [facility])    Orientation (person, place, time): Within normal limits      Recent and remote memory: Within normal limits           Attention span and concentration: Within normal limits         Fund of knowledge, awareness of current events and vocabulary: Not formally assessed; appear intact      Judgment: Within normal limits          Insight: Moderate to good        Suicidality/Homicidality/Aggression (Victimization or Perpetration): denies/denies/denies     ASSESSMENT   Today's Assessment:  Pt is not at risk of harming herself or others, but continues to feel need for increased support with dissociative experiences.      Risk Level Assessment  Risk Level Change (if yes, please describe): No    Suicide: low (1)  Violence: low (1)  Addiction: low (1)          DIAGNOSES ASSESSED TODAY (psychiatric diagnoses and medical diagnoses that factor into management of psychiatric treatment): PTSD, Dissociative disorder NOS; MDD, recurrent, moderate    CLINICAL FORMULATION (Make changes as your understanding changes. 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 Pt's strengths include intelligence, sense of humor, perseverence.    REVIEWING TODAY'S VISIT  CLINICAL INTERVENTIONS TODAY:   Gathered information  Offered support  Assessed sx and risk  Discussed above topics  Discussed therapy ruptures and repairs  Explored methods/interventions that work and don't feel right for pt  Explored and helped pt express emotions that are difficult to access in order to help pt to metabolize and build connections hampered by early  trauma    PATIENT'S RESPONSE TO INTERVENTIONS: Pt was engaged and responsive    PROGRESS TOWARDS GOALS: moderate    TIME SPENT IN PSYCHOTHERAPY: 45 min    PLAN  PLAN FOR MANAGING RISK (Consider risk plan for patients at moderate or high risk for suicide/violence/addiction; medication plan; referrals, etc. Must coincide with treatment plan.): N/A    PLAN FOR ONGOING TREATMENT:    1. Add an additional weekly psychotherapy session until sx severity more manageable for pt or pt can attend day tx program at [facility], then return to lower frequency /week   2. Referral to [place] day tx program if appropriate  3. Referral to neurofeedback tx if available at [facility]  4. Continue Self-Compassion group with [name]  5. Referral to psychopharm when pt ready  6. Consult with prescriber and PCP as needed     INFORMED CONSENT (for any new treatment): Patient was informed of the potential risks and benefits of the treatment, including the option not to treat, and appeared to understand and agreed to comply. Discussion included the following key points: N/A    [name], PhD, LICSW

## 2015-10-25 ENCOUNTER — Ambulatory Visit (HOSPITAL_BASED_OUTPATIENT_CLINIC_OR_DEPARTMENT_OTHER): Payer: MEDICARE | Admitting: Social Worker

## 2015-10-25 DIAGNOSIS — F449 Dissociative and conversion disorder, unspecified: Principal | ICD-10-CM

## 2015-10-25 NOTE — Progress Notes (Signed)
Brianna KitchenOUTPATIENT PSYCHIATRY PROGRESS NOTE    INTERPRETER: No    CONTACT INFO FOR OTHER AGENCIES AND MENTAL HEALTH PROVIDERS (IF APPLICABLE): Outside trauma-specialist therapist [name], Psy.D, [phone]; group therapist [name]    PROBLEM(S) ADDRESSED IN THIS SESSION:   1. Level of functioning, flashbacks, dissociative behaviors  2. Using knowledge of psychological & psychoanalytical theory to try to understand own sx/experiences  3. Strategies for working in therapy and past therapy experiences      SUBJECTIVE  TODAY'S CHIEF COMPLAINT AND CLINICAL UPDATES IN PATIENT'S WORDS:  1) Chief Complaint (Patient and/or guardian's own words, concerns and expressed thoughts): I think I'm past needing the therapist to change for me - what I mean is it's not so important for you to try to remember what I say to get things right; but more important that I can tell you when you get things wrong and when you respond in a kind and understanding way to that"    2) New information from patient and/or collateral (Patient's illness: context, course, modifying factors, severity, cultural, family, social, medical history):   Pt was on time to the appointment  Discussed a few incidents of misattunement /misunderstanding in yesterday's session, including pt's feeling that therapist had looked at her watch during the session because pt was talking too long or in "a dry manner" [in reality, to turn off an alarm about approaching end of session because office didn't have a clock] and a miscommunication at the end of session  Pt didn't want to go into the dissociated facial expressions and seeking therapist's comforting & helping pt mentalize responses today - talking again about copious readings pt has done and continues to do to understand her dissociation and how therapy could help her - discussing her sadness and anger about knowing she can "formulate" herself well, but feeling like this does not help, ie that an intellectual  understanding does not help her dissociative symptoms or suffering  Pointing out interpreting/"mindreading" is not what pt finds most helpful in tx and has in fact experienced it as blaming/harmful; instead brings up Busch's 'creating a psychoanalytical mind' and questions from the therapist that focus on state changes in the pt, especially those of voice  Also referencing "what is this about?" questions ("hermeneutics of suspicion") vs. "what does the pt need?" questions in therapy  Importantly, towards the end of session pt points out that she is not looking for a perfect responsiveness in a therapist but the ability to tolerate in a kind way the pt pointing out when therapist gets things wrong - and makes considerable effort to tell therapist that she is experiencing the therapist as a kind as well as protective presence   Feels celexa, her own readings and studying, and therapy together are helping somewhat with sx severity and tolerating dissociative states, but still wants to keep open [facility] day tx and neurofeedback     OBJECTIVE  DATA REVIEWED (Consider medical labs, radiology, other medical tests; screening/outcome measures; psychological testing; discussion of test results with other clinicians; consultation with other clinicians and systems involved with patient, summary of old records): Consulted EMR    [place] (make clear medications prescribed by psychiatry; include OTC medications):    Current Outpatient Prescriptions:  citalopram (CELEXA) 10 MG tablet Take 1 tablet by mouth daily 2 tabs daily. For: depression and anxiety. Disp: 60 tablet Rfl: 0   propranolol (INDERAL) 10 MG tablet Take 1 tablet by mouth 3 (three) times daily as needed For rapid  heart rate or anxiety symptoms. Disp: 90 tablet Rfl: 3   THEANINE PO Take  by mouth. Disp:  Rfl:    Multiple Vitamin (MULTIVITAMINS PO)  Disp:  Rfl:      No current facility-administered medications for this visit.     MEDICATION ADHERENCE  (including barriers and how addressed): yes     MEDICATION SIDE EFFECTS (Prescribers Only): N/A    BIRTH CONTROL (ask females and males): N/A    CURRENT PREGNANCY: N/A      MENTAL STATUS EXAMINATION                     General Appearance: Groomed white woman wearing weather appropriate clothing, graying shoulder-length hair     Interaction with Interviewer (eye contact, attitude, behavior): Fair to good eye contact, open - more intellectualized/"adult" today, using humour    Physical Signs    Gait and Station (how patient walks and stands): Within normal limits                  Physical Appearance: Within normal limits          Normal Movements: Within normal limits         Speech (rate, volume, articulation): Within normal limits          Language: Within normal limits                       Mood: "confused"            Affect: Congruous with content, variable    Thought Process     Rate; concrete vs abstract reasoning: Within normal limits        Logical vs illogical; associations: loose, tangential, circumstantial, intact: Pt notes she is/can be mildly tangential at times                                    Thought Content    Normal Thought Content (other than safety): Within normal limits     Perceptions (auditory, visual, tactile, etc.): Within normal limits       Impulse Control: Within normal limits         Cognition (Link to [facility])    Orientation (person, place, time): Within normal limits      Recent and remote memory: Within normal limits           Attention span and concentration: Within normal limits         Fund of knowledge, awareness of current events and vocabulary: Not formally assessed; appear intact      Judgment: Within normal limits          Insight: Moderate to good        Suicidality/Homicidality/Aggression (Victimization or Perpetration): denies/denies/denies     ASSESSMENT   Today's Assessment:  Pt is not at risk of harming herself or others, but continues to feel need for increased support with  dissociative experiences.      Risk Level Assessment  Risk Level Change (if yes, please describe): No    Suicide: low (1)  Violence: low (1)  Addiction: low (1)          DIAGNOSES ASSESSED TODAY (psychiatric diagnoses and medical diagnoses that factor into management of psychiatric treatment): PTSD, Dissociative disorder NOS; MDD, recurrent, moderate    CLINICAL FORMULATION (Make changes as your understanding changes. 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 Pt's strengths include intelligence, sense of humor, perseverence.    REVIEWING TODAY'S VISIT  CLINICAL INTERVENTIONS TODAY:   Gathered information  Offered support  Assessed sx and risk  Discussed above topics  Discussed therapy ruptures and repairs  Explored methods/interventions that work and don't feel right for pt  Explored and helped pt express emotions that are difficult to access in order to help pt to metabolize and build connections hampered by early trauma    PATIENT'S RESPONSE TO INTERVENTIONS: Pt was engaged and responsive    PROGRESS TOWARDS GOALS: some    TIME SPENT IN PSYCHOTHERAPY: 45 min    [place] (Consider risk plan for patients at moderate or high risk for suicide/violence/addiction; medication plan; referrals, etc. Must coincide with treatment plan.): N/A    PLAN FOR ONGOING TREATMENT:    1. Add an additional weekly psychotherapy session until sx severity more manageable for pt or pt  can attend day tx program at [facility], then return to lower frequency /week   2. Referral to [place] day tx program if appropriate  3. Referral to [facility]feedback tx if available at [facility]  4. Continue Self-Compassion group with [name]  5. Referral to psychopharm when pt ready  6. Consult with prescriber and PCP as needed     INFORMED CONSENT (for any new treatment): Patient was informed of the potential risks and benefits of the treatment, including the option not to treat, and appeared to understand and agreed to comply. Discussion included the following key points: N/A    [name], PhD, LICSW

## 2015-10-26 ENCOUNTER — Ambulatory Visit (HOSPITAL_BASED_OUTPATIENT_CLINIC_OR_DEPARTMENT_OTHER): Payer: MEDICARE | Admitting: Social Worker

## 2015-10-26 DIAGNOSIS — F331 Major depressive disorder, recurrent, moderate: Secondary | ICD-10-CM

## 2015-10-26 DIAGNOSIS — F449 Dissociative and conversion disorder, unspecified: Principal | ICD-10-CM

## 2015-10-26 NOTE — Progress Notes (Signed)
Marland KitchenOUTPATIENT PSYCHIATRY PROGRESS NOTE    INTERPRETER: No    CONTACT INFO FOR OTHER AGENCIES AND MENTAL HEALTH PROVIDERS (IF APPLICABLE): Outside trauma-specialist therapist [name], Psy.D, [phone]; group therapist [name]    PROBLEM(S) ADDRESSED IN THIS SESSION:   1. Level of functioning, flashbacks, dissociative behaviors  2. Referral to psychopharmacology  3. Strategies for working in therapy and past therapy experiences  4. Use of 'projective identification' in therapy      SUBJECTIVE  TODAY'S CHIEF COMPLAINT AND CLINICAL UPDATES IN PATIENT'S WORDS:  1) Chief Complaint (Patient and/or guardian's own words, concerns and expressed thoughts): "Last night it was so hard that I thought 'so this is why some people use tranquilizers'... I don't know if I'm going to a deeper level or if I've lost capacity to deal with the flashbacks"    2) New information from patient and/or collateral (Patient's illness: context, course, modifying factors, severity, cultural, family, social, medical history):   Pt was on time to the appointment  Brought in a page of handwritten text / partly with dissociated anger/sadness [several different kinds of handwriting on page], part with observing ego in place - didn't want to go into discussing it today but wanted therapist to keep it for future use  Described a difficult experience of dissociation the night before, is wondering if she would benefit from a psychopharmacology specialist who would have more experience with dissociation - feels Celexa has been helpful but is wondering about antianxiety medication - we decide to send in a psychopharmacology referral today rather than pt contacting her PCP  We continue to discuss the ways certain "open" or "receptive" responses help pt to express herself without "having to shout" at the therapist - also the way pt feels she must make use of "projective identification" in therapy as a way of 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   Continuing to identify the levels of work pt feels she needs - "cortical and subcortical" as well as "comforting - metabolizing - mentalizing"      OBJECTIVE  DATA REVIEWED (Consider medical labs, radiology, other medical tests; screening/outcome measures; psychological testing; discussion of test results with other clinicians; consultation with other clinicians and systems involved with patient, summary of old records): Consulted EMR    CURRENT MEDICATIONS (make clear medications prescribed by psychiatry; include OTC medications):    Current Outpatient Prescriptions:  citalopram (CELEXA) 10 MG tablet Take 1 tablet by mouth daily 2 tabs daily. For: depression and anxiety. Disp: 60 tablet Rfl: 0   propranolol (INDERAL) 10 MG tablet Take 1 tablet by mouth 3 (three) times daily as needed For rapid heart rate or anxiety symptoms. Disp: 90 tablet Rfl: 3   THEANINE PO Take  by mouth. Disp:  Rfl:    Multiple Vitamin (MULTIVITAMINS PO)  Disp:  Rfl:      No current facility-administered medications for this visit.     MEDICATION ADHERENCE (including barriers and how addressed): yes     MEDICATION SIDE EFFECTS (Prescribers Only): N/A    BIRTH CONTROL (ask females and males): N/A    CURRENT PREGNANCY: N/A      MENTAL STATUS EXAMINATION                     General Appearance: Groomed white woman wearing weather appropriate clothing, graying shoulder-length hair     Interaction with Interviewer (eye contact, attitude, behavior): Fair to good eye contact, open - more intellectualized/"adult" today, using  humour    Physical Signs    Gait and Station (how patient walks and stands): Within normal limits                  Physical Appearance: Within normal limits          Normal Movements: Within normal limits         Speech (rate, volume, articulation): Within normal limits          Language: Within normal limits                       Mood: "hard to say"            Affect: Congruous with content,  variable    Thought Process     Rate; concrete vs abstract reasoning: Within normal limits        Logical vs illogical; associations: loose, tangential, circumstantial, intact: Pt notes she is/can be mildly tangential at times                                    Thought Content    Normal Thought Content (other than safety): Within normal limits     Perceptions (auditory, visual, tactile, etc.): Within normal limits       Impulse Control: Within normal limits         Cognition (Link to [facility])    Orientation (person, place, time): Within normal limits      Recent and remote memory: Within normal limits           Attention span and concentration: Within normal limits         Fund of knowledge, awareness of current events and vocabulary: Not formally assessed; appear intact      Judgment: Within normal limits          Insight: Moderate to good        Suicidality/Homicidality/Aggression (Victimization or Perpetration): denies/denies/denies     ASSESSMENT   Today's Assessment:  Pt is not at risk of harming herself or others, asks for psychopharm consultation today to help deal with flashbacks    Risk Level Assessment  Risk Level Change (if yes, please describe): No    Suicide: low (1)  Violence: low (1)  Addiction: low (1)          DIAGNOSES ASSESSED TODAY (psychiatric diagnoses and medical diagnoses that factor into management of psychiatric treatment): PTSD, Dissociative disorder NOS; MDD, recurrent, moderate    CLINICAL FORMULATION (Make changes as your understanding changes. 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 Pt's strengths include intelligence, sense of humor, perseverence.    REVIEWING TODAY'S VISIT  CLINICAL INTERVENTIONS TODAY:   Gathered information  Offered support  Assessed sx and risk  Discussed above topics  Discussed therapy ruptures and repairs  Explored methods/interventions that work and don't feel right for pt  Explored and helped pt express emotions that are difficult to access in order to help pt to metabolize  and build connections hampered by early trauma  Discussed helpfulness of pt being able to leave a VM between session (with understanding that therapist response will not be immediate)  Discussed referral to psychopharm specialist    PATIENT'S RESPONSE TO INTERVENTIONS: Pt was engaged and responsive    PROGRESS TOWARDS GOALS: moderate    TIME SPENT IN PSYCHOTHERAPY: 45 min    PLAN  PLAN FOR MANAGING RISK (Consider risk plan for patients at moderate or high risk for suicide/violence/addiction; medication plan; referrals, etc. Must coincide with treatment plan.): N/A    PLAN FOR ONGOING TREATMENT:    1. Add an additional weekly psychotherapy session until sx severity more manageable for pt or pt can attend day tx program at [place], then return to lower frequency /week   2. Referral to [place] day tx program if appropriate  3. Referral to neurofeedback tx if available at [facility]  4. Continue Self-Compassion group with [name]  5. Referral to psychopharm  6. Consult with prescriber and PCP as needed     INFORMED CONSENT (for any new treatment): Patient was informed of the potential risks and benefits of the treatment, including the option not to treat, and appeared to understand and agreed to comply. Discussion included the following key points: N/A    [name], PhD, LICSW

## 2015-10-29 ENCOUNTER — Other Ambulatory Visit (HOSPITAL_BASED_OUTPATIENT_CLINIC_OR_DEPARTMENT_OTHER): Payer: Self-pay | Admitting: Social Worker

## 2015-10-29 NOTE — Progress Notes (Signed)
Thank you for your referral.  It was forwarded to Geriatrics for further review and treatment.

## 2015-10-30 ENCOUNTER — Ambulatory Visit (HOSPITAL_BASED_OUTPATIENT_CLINIC_OR_DEPARTMENT_OTHER): Payer: MEDICARE | Admitting: Social Worker

## 2015-10-30 DIAGNOSIS — F449 Dissociative and conversion disorder, unspecified: Secondary | ICD-10-CM

## 2015-10-30 NOTE — Progress Notes (Signed)
Marland KitchenOUTPATIENT PSYCHIATRY PROGRESS NOTE    INTERPRETER: No    CONTACT INFO FOR OTHER AGENCIES AND MENTAL HEALTH PROVIDERS (IF APPLICABLE): Outside trauma-specialist therapist [name], Psy.D, [phone]; group therapist [name]    PROBLEM(S) ADDRESSED IN THIS SESSION:   1. Level of functioning, flashbacks, dissociative experiences and behaviors  2. Strategies for working in therapy and past therapy experiences  3. Importance to pt of therapist "overreaching"  4.       SUBJECTIVE  TODAY'S CHIEF COMPLAINT AND CLINICAL UPDATES IN PATIENT'S WORDS:  1) Chief Complaint (Patient and/or guardian's own words, concerns and expressed thoughts): "I think in the continuum of 'protest-despair-detachment' in attachment rupture, I reached detachment"    2) New information from patient and/or collateral (Patient's illness: context, course, modifying factors, severity, cultural, family, social, medical history):   Pt was on time to the appointment  Had left six VM messages since last appointment Friday; in two of them describing feeling shaken after dissociative episodes and in four delineating how she hoped therapist could help her talk about painful experiences; ie that she would need therapist to start each session asking pt directly about painful experiences "so i can know you're open to and ready to go there with me"  In this session, we do open with this, and pt does immediately go into a quiet, tearful state with very sad/despairing facial emotions, but not speaking - find some of therapist's response comforting, namely a soothing tone of voice and some naming/labeling; feels the response of 'I am here' 'does nothing for her" - able to describe some physical feelings associated with state of "despair", ie a chest feeling of "heart breaking" and her throat closing up and then relaxing  Continues to explicate ways in which she feels she needs "more" than "therapists are trained to give" as a response to her understanding  of childhood "vagal shutdown" - the mother/caregiver cannot just "reach for the baby, be there for her" because the child has reached a stage of "detachment" as a response to attachment rupture (beyond 'protest' and 'despair') - the caregiver has to "overreach" for the baby      OBJECTIVE  DATA REVIEWED (Consider medical labs, radiology, other medical tests; screening/outcome measures; psychological testing; discussion of test results with other clinicians; consultation with other clinicians and systems involved with patient, summary of old records): [place] (make clear medications prescribed by psychiatry; include OTC medications):    Current Outpatient Prescriptions:  citalopram (CELEXA) 10 MG tablet Take 1 tablet by mouth daily 2 tabs daily. For: depression and anxiety. Disp: 60 tablet Rfl: 0   propranolol (INDERAL) 10 MG tablet Take 1 tablet by mouth 3 (three) times daily as needed For rapid heart rate or anxiety symptoms. Disp: 90 tablet Rfl: 3   THEANINE PO Take  by mouth. Disp:  Rfl:    Multiple Vitamin (MULTIVITAMINS PO)  Disp:  Rfl:      No current facility-administered medications for this visit.     MEDICATION ADHERENCE (including barriers and how addressed): yes     MEDICATION SIDE EFFECTS (Prescribers Only): N/A    BIRTH CONTROL (ask females and males): N/A    CURRENT PREGNANCY: N/A      MENTAL STATUS EXAMINATION                     General Appearance: Groomed white woman wearing weather appropriate clothing, graying shoulder-length hair     Interaction with Interviewer (eye  contact, attitude, behavior): Fair to good eye contact, open - both more emotional, non-verbal state and intellectual state present today    Physical Signs    Gait and Station (how patient walks and stands): Within normal limits                  Physical Appearance: Within normal limits          Normal Movements: Within normal limits         Speech (rate, volume, articulation): Within normal limits           Language: Within normal limits                       Mood: "despair"            Affect: Congruous with content, variable    Thought Process     Rate; concrete vs abstract reasoning: Within normal limits        Logical vs illogical; associations: loose, tangential, circumstantial, intact: Pt notes she is/can be mildly tangential at times                                    Thought Content    Normal Thought Content (other than safety): Within normal limits     Perceptions (auditory, visual, tactile, etc.): Within normal limits       Impulse Control: Within normal limits         Cognition (Link to [facility])    Orientation (person, place, time): Within normal limits      Recent and remote memory: Within normal limits           Attention span and concentration: Within normal limits         Fund of knowledge, awareness of current events and vocabulary: Not formally assessed; appear intact      Judgment: Within normal limits          Insight: Moderate to good        Suicidality/Homicidality/Aggression (Victimization or Perpetration): denies/denies/denies     ASSESSMENT   Today's Assessment:  Pt is not at risk of harming herself or others, but continues to report distress that is hard for her to tolerate about dissociative episodes    Risk Level Assessment  Risk Level Change (if yes, please describe): No    Suicide: low (1)  Violence: low (1)  Addiction: low (1)          DIAGNOSES ASSESSED TODAY (psychiatric diagnoses and medical diagnoses that factor into management of psychiatric treatment): PTSD, Dissociative disorder NOS; MDD, recurrent, moderate    CLINICAL FORMULATION (Make changes as your understanding changes. 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 Pt's strengths include intelligence, sense of humor, perseverence.    REVIEWING TODAY'S VISIT  CLINICAL INTERVENTIONS TODAY:   Gathered information  Offered support  Assessed sx and risk  Discussed above topics  Discussed therapy ruptures and repairs  Explored methods/interventions that work and don't feel  right for pt  Explored and helped pt express emotions that are difficult to access in order to help pt to metabolize and build connections hampered by early trauma    PATIENT'S RESPONSE TO INTERVENTIONS: Pt was engaged and responsive    PROGRESS TOWARDS GOALS: some    TIME SPENT IN PSYCHOTHERAPY: 45 min    [place] (Consider risk plan for patients at moderate or high risk for suicide/violence/addiction; medication plan; referrals, etc. Must coincide with treatment plan.): N/A    PLAN FOR ONGOING TREATMENT:    1. Add an additional weekly psychotherapy session until sx severity more manageable for pt or pt can attend day tx program at [place], then return to lower frequency /week   2. Referral to [place] day tx program if appropriate  3. Referral to [facility]feedback tx if available at [facility]  4. Continue Self-Compassion group with [name]  5. Referral to psychopharm made 3/17 -> was referred forward to [facility] psych 3/20  6. Consult with prescriber and PCP as needed     INFORMED CONSENT (for any new treatment): Patient was informed of the potential risks and benefits of the treatment, including the option not to treat, and appeared to understand and agreed to comply. Discussion included the following key points: N/A    [name], PhD, LICSW

## 2015-10-31 ENCOUNTER — Ambulatory Visit (HOSPITAL_BASED_OUTPATIENT_CLINIC_OR_DEPARTMENT_OTHER): Payer: MEDICARE | Admitting: Social Worker

## 2015-10-31 ENCOUNTER — Ambulatory Visit (HOSPITAL_BASED_OUTPATIENT_CLINIC_OR_DEPARTMENT_OTHER): Payer: MEDICARE | Admitting: Clinical

## 2015-10-31 DIAGNOSIS — F449 Dissociative and conversion disorder, unspecified: Secondary | ICD-10-CM

## 2015-10-31 NOTE — Progress Notes (Signed)
.  OUTPATIENT PSYCHIATRY GROUP PROGRESS NOTE    Patient Name: Brianna [name]    Group Name: mindful self compassion    Leaders: Woodward Klem    Service Type: [phone] Group Psychotherapy     Length of Group: 75 minutes     Number of participants in group today: 6        Purpose of Group (choose all that apply):   Symptom relief  Affect regulation  Interpersonal skill development  Support (psychological, family, community resources)  Insight and behavior change         Group Process:  Group #4 : mindfulness/emotions; kindness    Individual Patient Participation:    Attentive to process  Identified with peers  Minimal participation  Seemed distracted  Shared personal experience related to subject addressed in group  Supportive of other members  Acknowledged mistake of sleeping through last week's group.    Diagnosis (addressed by this group):  PTSD    Medical Necessity of Session (how treatment is necessary to improve symptoms, functioning, or prevent worsening): increase self awareness and tools for self compassion    Relevant Changes in Mental Status: No.     Risk Level per Scale:  Suicide: low  Violence: low  Addiction: low    Current risk level represents increase in risk: No.                                                   [name], LICSW                                  10/31/2015

## 2015-10-31 NOTE — Progress Notes (Signed)
Marland KitchenOUTPATIENT PSYCHIATRY PROGRESS NOTE    INTERPRETER: No    CONTACT INFO FOR OTHER AGENCIES AND MENTAL HEALTH PROVIDERS (IF APPLICABLE): Outside trauma-specialist therapist [name], Psy.D, [phone]; group therapist [name]    PROBLEM(S) ADDRESSED IN THIS SESSION:   1. Level of functioning, flashbacks, dissociative experiences and behaviors  2. Strategies for working in therapy and past therapy experiences  3. Projective identification and its uses  4.       SUBJECTIVE  TODAY'S CHIEF COMPLAINT AND CLINICAL UPDATES IN PATIENT'S WORDS:  1) Chief Complaint (Patient and/or guardian's own words, concerns and expressed thoughts): "I had to be told that this is how you make associations, I had never figured it out for myself... I can make deductions about my childhood and experiences in therapy but they have stayed at almost a purely logical level, not something I can feel"    2) New information from patient and/or collateral (Patient's illness: context, course, modifying factors, severity, cultural, family, social, medical history):   Pt was on time to the appointment  One VM since last session after pt was experiencing dissociation  Pt appears to be benefiting from a neuropsychology class/course she is taking in that she is learning ideas/concepts that help her on an intellectual level to make connections, although she is feeling that she is lacking an emotionally associative capacity that she thinks might be necessary for the sort of 'working through' of experiences and building the connections "from the subcortical level up" rather than "from cortical level down"  Pt enters into the state of being quiet, eyes closed, with facial expressions in the beginning of session and takes well provider noting her difficulty in connecting with what pt could be experiencing - thinks this might be a positive use of projective identification as provider is in a way sharing pt's experience of not feeling connected to the  facial expressions/not knowing what they mean "emotionally" and what pt would need to feel comforted around them      OBJECTIVE  DATA REVIEWED (Consider medical labs, radiology, other medical tests; screening/outcome measures; psychological testing; discussion of test results with other clinicians; consultation with other clinicians and systems involved with patient, summary of old records): Consulted EMR    [place] (make clear medications prescribed by psychiatry; include OTC medications):    Current Outpatient Prescriptions:  citalopram (CELEXA) 10 MG tablet Take 1 tablet by mouth daily 2 tabs daily. For: depression and anxiety. Disp: 60 tablet Rfl: 0   propranolol (INDERAL) 10 MG tablet Take 1 tablet by mouth 3 (three) times daily as needed For rapid heart rate or anxiety symptoms. Disp: 90 tablet Rfl: 3   THEANINE PO Take  by mouth. Disp:  Rfl:    Multiple Vitamin (MULTIVITAMINS PO)  Disp:  Rfl:      No current facility-administered medications for this visit.     MEDICATION ADHERENCE (including barriers and how addressed): yes     MEDICATION SIDE EFFECTS (Prescribers Only): N/A    BIRTH CONTROL (ask females and males): N/A    CURRENT PREGNANCY: N/A      MENTAL STATUS EXAMINATION                     General Appearance: Groomed white woman wearing weather appropriate clothing, graying shoulder-length hair     Interaction with Interviewer (eye contact, attitude, behavior): Fair to good eye contact, open - both non-verbal state and intellectual state present today    Physical Signs  Gait and Station (how patient walks and stands): Within normal limits                  Physical Appearance: Within normal limits          Normal Movements: Within normal limits         Speech (rate, volume, articulation): Within normal limits          Language: Within normal limits                       Mood: "not sure what I feel"            Affect: Congruous with content, variable    Thought Process     Rate; concrete vs  abstract reasoning: Within normal limits        Logical vs illogical; associations: loose, tangential, circumstantial, intact: Pt notes she is/can be mildly tangential at times                                    Thought Content    Normal Thought Content (other than safety): Within normal limits     Perceptions (auditory, visual, tactile, etc.): Within normal limits       Impulse Control: Within normal limits         Cognition (Link to [facility])    Orientation (person, place, time): Within normal limits      Recent and remote memory: Within normal limits           Attention span and concentration: Within normal limits         Fund of knowledge, awareness of current events and vocabulary: Not formally assessed; appear intact      Judgment: Within normal limits          Insight: Moderate to good        Suicidality/Homicidality/Aggression (Victimization or Perpetration): denies/denies/denies     ASSESSMENT   Today's Assessment:  Pt is not at risk of harming herself or others, calmer / easier to soothe today    Risk Level Assessment  Risk Level Change (if yes, please describe): No    Suicide: low (1)  Violence: low (1)  Addiction: low (1)          DIAGNOSES ASSESSED TODAY (psychiatric diagnoses and medical diagnoses that factor into management of psychiatric treatment): PTSD, Dissociative disorder NOS; MDD, recurrent, moderate    CLINICAL FORMULATION (Make changes as your understanding changes. 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 Pt's strengths include intelligence, sense of humor, perseverence.    REVIEWING TODAY'S VISIT  CLINICAL INTERVENTIONS TODAY:   Gathered information  Offered support  Assessed sx and risk  Discussed above topics  Discussed therapy ruptures and repairs  Explored methods/interventions that work and don't feel right for pt  Explored and helped pt express emotions that are difficult to access in order to help pt to metabolize and build connections hampered by early trauma  Discussed uses  of both intellectual and emotional working through of trauma and attachment rupture    PATIENT'S RESPONSE TO INTERVENTIONS: Pt was engaged and responsive    PROGRESS TOWARDS GOALS: good    TIME SPENT IN PSYCHOTHERAPY: 45 min    [place] (Consider risk plan for patients at moderate or high risk for suicide/violence/addiction; medication plan; referrals, etc. Must coincide with treatment plan.): N/A    PLAN FOR ONGOING TREATMENT:    1. Add an additional weekly psychotherapy session until sx severity more manageable for pt or pt can attend day tx program at [place], then return to lower frequency /week   2. Referral to [place] day tx program if appropriate  3. Referral to [facility]feedback tx if available at [facility]  4. Continue Self-Compassion group with [name]  5. Referral to psychopharm made 3/17 -> was referred forward to [facility] psych 3/20  6. Consult with prescriber and PCP as needed     INFORMED CONSENT (for any new treatment): Patient was informed of the potential risks and benefits of the treatment, including the option not to treat, and appeared to understand and agreed to comply. Discussion included the following key points: N/A    [name], PhD, LICSW

## 2015-11-01 ENCOUNTER — Ambulatory Visit (HOSPITAL_BASED_OUTPATIENT_CLINIC_OR_DEPARTMENT_OTHER): Payer: MEDICARE | Admitting: Social Worker

## 2015-11-01 DIAGNOSIS — F449 Dissociative and conversion disorder, unspecified: Principal | ICD-10-CM

## 2015-11-01 NOTE — Progress Notes (Signed)
Brianna KitchenOUTPATIENT PSYCHIATRY PROGRESS NOTE    INTERPRETER: No    CONTACT INFO FOR OTHER AGENCIES AND MENTAL HEALTH PROVIDERS (IF APPLICABLE): Outside trauma-specialist therapist [name], Psy.D, [phone]; group therapist [name]    PROBLEM(S) ADDRESSED IN THIS SESSION:   1. Level of functioning, flashbacks, dissociative experiences and behaviors  2. Strategies for working in therapy and past therapy experiences  3. Suicidal ideation in the past  4.       SUBJECTIVE  TODAY'S CHIEF COMPLAINT AND CLINICAL UPDATES IN PATIENT'S WORDS:  1) Chief Complaint (Patient and/or guardian's own words, concerns and expressed thoughts): "I've had suicidal thoughts off and on throughout my life... What keeps me from doing it has been that I know I would hurt people in my life, and because I'm such a klutz I know I would fail and end up worse than I was"    2) New information from patient and/or collateral (Patient's illness: context, course, modifying factors, severity, cultural, family, social, medical history):   Pt was on time to the appointment  No VMs since last session   We spend first 6 minutes of session with pt (purposefully) entering into quiet/dissociative mode, and then discuss the experience including the importance for therapist to be authentic and trouble with feeling pressure to speak - pt able to take in this and work collaboratively on how to mitigate conflicting factors in what pt needs & what therapist is able to offer at any given moment  Talking more openly about 'squashed' protesting/frustration/anger and pt's uncertainty about how much she can express / how much can others take  Noticing in the room how pt is calming herself down when threatens to feel heightened/agitated  Also discussed for the first time in more depth pt's past recurring suicidal ideation and protective factors - pt has never acted on it and is not currently feeling at risk of acting on it      OBJECTIVE  DATA REVIEWED (Consider  medical labs, radiology, other medical tests; screening/outcome measures; psychological testing; discussion of test results with other clinicians; consultation with other clinicians and systems involved with patient, summary of old records): Consulted EMR    CURRENT MEDICATIONS (make clear medications prescribed by psychiatry; include OTC medications):    Current Outpatient Prescriptions:  citalopram (CELEXA) 10 MG tablet Take 1 tablet by mouth daily 2 tabs daily. For: depression and anxiety. Disp: 60 tablet Rfl: 0   propranolol (INDERAL) 10 MG tablet Take 1 tablet by mouth 3 (three) times daily as needed For rapid heart rate or anxiety symptoms. Disp: 90 tablet Rfl: 3   THEANINE PO Take  by mouth. Disp:  Rfl:    Multiple Vitamin (MULTIVITAMINS PO)  Disp:  Rfl:      No current facility-administered medications for this visit.     MEDICATION ADHERENCE (including barriers and how addressed): yes     MEDICATION SIDE EFFECTS (Prescribers Only): N/A    BIRTH CONTROL (ask females and males): N/A    CURRENT PREGNANCY: N/A      MENTAL STATUS EXAMINATION                     General Appearance: Groomed white woman wearing weather appropriate clothing, graying shoulder-length hair     Interaction with Interviewer (eye contact, attitude, behavior): Fair to good eye contact, open - both non-verbal state and intellectual state present today    Physical Signs    Gait and Station (how patient walks and stands): Within normal  limits                  Physical Appearance: Within normal limits          Normal Movements: Within normal limits         Speech (rate, volume, articulation): Within normal limits          Language: Within normal limits                       Mood: "I don't know"            Affect: Congruous with content, variable    Thought Process     Rate; concrete vs abstract reasoning: Within normal limits        Logical vs illogical; associations: loose, tangential, circumstantial, intact: Pt notes she is/can be mildly  tangential at times                                    Thought Content    Normal Thought Content (other than safety): Within normal limits     Perceptions (auditory, visual, tactile, etc.): Within normal limits       Impulse Control: Within normal limits         Cognition (Link to [facility])    Orientation (person, place, time): Within normal limits      Recent and remote memory: Within normal limits           Attention span and concentration: Within normal limits         Fund of knowledge, awareness of current events and vocabulary: Not formally assessed; appear intact      Judgment: Within normal limits          Insight: Moderate to good        Suicidality/Homicidality/Aggression (Victimization or Perpetration): denies/denies/denies     ASSESSMENT   Today's Assessment:  Pt is not at risk of harming herself or others, opening up about past recurring SI - denies current ideation    Risk Level Assessment  Risk Level Change (if yes, please describe): No    Suicide: low (1)  Violence: low (1)  Addiction: low (1)          DIAGNOSES ASSESSED TODAY (psychiatric diagnoses and medical diagnoses that factor into management of psychiatric treatment): PTSD, Dissociative disorder NOS; MDD, recurrent, moderate    CLINICAL FORMULATION (Make changes as your understanding changes. 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 Pt's strengths include intelligence, sense of humor,  perseverence.    REVIEWING TODAY'S VISIT  CLINICAL INTERVENTIONS TODAY:   Gathered information  Offered support  Assessed sx and risk  Discussed above topics  Discussed therapy ruptures and repairs  Explored methods/interventions that work and don't feel right for pt  Explored and helped pt express emotions that are difficult to access in order to help pt to metabolize and build connections hampered by early trauma  Discussed uses of both intellectual and emotional working through of  trauma and attachment rupture  Discussed hx of SI and assessed current risk    PATIENT'S RESPONSE TO INTERVENTIONS: Pt was engaged and responsive    PROGRESS TOWARDS GOALS: moderate    TIME SPENT IN PSYCHOTHERAPY: 45 min    PLAN  PLAN FOR MANAGING RISK (Consider risk plan for patients at moderate or high risk for suicide/violence/addiction; medication plan; referrals, etc. Must coincide with treatment plan.): N/A    PLAN FOR ONGOING TREATMENT:    1. Add an additional weekly psychotherapy session until sx severity more manageable for pt or pt can attend day tx program at [place], then return to lower frequency /week   2. Referral to [place] day tx program if appropriate  3. Referral to [facility]feedback tx if available at [facility]  4. Continue Self-Compassion group with [name]  5. Referral to psychopharm made 3/17 -> was referred forward to [facility] psych 3/20  6. Consult with prescriber and PCP as needed     INFORMED CONSENT (for any new treatment): Patient was informed of the potential risks and benefits of the treatment, including the option not to treat, and appeared to understand and agreed to comply. Discussion included the following key points: N/A    [name], PhD, LICSW

## 2015-11-06 ENCOUNTER — Ambulatory Visit (HOSPITAL_BASED_OUTPATIENT_CLINIC_OR_DEPARTMENT_OTHER): Payer: MEDICARE | Admitting: Social Worker

## 2015-11-06 DIAGNOSIS — F331 Major depressive disorder, recurrent, moderate: Secondary | ICD-10-CM

## 2015-11-06 DIAGNOSIS — F449 Dissociative and conversion disorder, unspecified: Principal | ICD-10-CM

## 2015-11-06 NOTE — Progress Notes (Signed)
Marland KitchenOUTPATIENT PSYCHIATRY PROGRESS NOTE    INTERPRETER: No    CONTACT INFO FOR OTHER AGENCIES AND MENTAL HEALTH PROVIDERS (IF APPLICABLE): Outside trauma-specialist therapist [name], Psy.D, [phone]; group therapist [name]    PROBLEM(S) ADDRESSED IN THIS SESSION:   1. Level of functioning, flashbacks, dissociative experiences and behaviors  2. Strategies for working in therapy and past therapy experiences  3. Suicidal ideation in the past  4.       SUBJECTIVE  TODAY'S CHIEF COMPLAINT AND CLINICAL UPDATES IN PATIENT'S WORDS:  1) Chief Complaint (Patient and/or guardian's own words, concerns and expressed thoughts): "With past therapists... Since I could see my only available response was to yell at them, and my yelling was what triggered them to yell at me, I didn't see any way to proceed"    2) New information from patient and/or collateral (Patient's illness: context, course, modifying factors, severity, cultural, family, social, medical history):   Pt was on time to the appointment  A VM left last week about pt's response to therapist's response to talking about pt's hx of suicidal ideation - pt wanted to note that she did not have an emotional response to therapist showing sadness at the thought of pt's SI  Today, focus on progress and reparative experiences pt feels she has had and still needs in therapy  Pt feels compassion from therapist is crucial for her getting out of a "stimulus-response" mode of acting & expressing herself; especially when it comes to tears and crying - pt feels her response is overdetermined by both childhood traumatic experiences and responses of others in adulthood, both inside and outside therapy  When asked about how to avoid entering into the same enactment in this therapy and/or how to work to get out of it, pt feels it has been helpful for her to be able to write emails/leave VM "I can do my 'yelling' in there" so there's less need for pt to 'yell' in the room - a  compassionate, open acceptance of pt's anger is another helpful thing - thirdly, being aware of this dynamic may help to not land in it blindly  Pt also feels meeting three times a week has been helpful in stabilizing her "I feel the past two-three years have been so difficult... They could be seen as a slow suicide"  Is able to verbalize and express feeling sadness (rather than being dissociated from it) when discussing father's suicidality and suffering  Pt also expresses interest in returning to list of experiences from a former therapist and what she could learn from it with help of t/therapist    OBJECTIVE  DATA REVIEWED (Consider medical labs, radiology, other medical tests; screening/outcome measures; psychological testing; discussion of test results with other clinicians; consultation with other clinicians and systems involved with patient, summary of old records): Consulted EMR    CURRENT MEDICATIONS (make clear medications prescribed by psychiatry; include OTC medications):    Current Outpatient Prescriptions:  citalopram (CELEXA) 10 MG tablet Take 1 tablet by mouth daily 2 tabs daily. For: depression and anxiety. Disp: 60 tablet Rfl: 0   propranolol (INDERAL) 10 MG tablet Take 1 tablet by mouth 3 (three) times daily as needed For rapid heart rate or anxiety symptoms. Disp: 90 tablet Rfl: 3   THEANINE PO Take  by mouth. Disp:  Rfl:    Multiple Vitamin (MULTIVITAMINS PO)  Disp:  Rfl:      No current facility-administered medications for this visit.     MEDICATION ADHERENCE (  including barriers and how addressed): yes     MEDICATION SIDE EFFECTS (Prescribers Only): N/A    BIRTH CONTROL (ask females and males): N/A    CURRENT PREGNANCY: N/A      MENTAL STATUS EXAMINATION                     General Appearance: Groomed white woman wearing weather appropriate clothing, graying shoulder-length hair     Interaction with Interviewer (eye contact, attitude, behavior): Fair to good eye contact, open - both non-verbal  state and intellectual state present today, using humor    Physical Signs    Gait and Station (how patient walks and stands): Within normal limits                  Physical Appearance: Within normal limits          Normal Movements: Within normal limits         Speech (rate, volume, articulation): Within normal limits          Language: Within normal limits                       Mood: "sad"            Affect: Congruous with content, variable, tearful at times    Thought Process     Rate; concrete vs abstract reasoning: Within normal limits        Logical vs illogical; associations: loose, tangential, circumstantial, intact: Pt notes she is/can be mildly tangential at times                                    Thought Content    Normal Thought Content (other than safety): Within normal limits     Perceptions (auditory, visual, tactile, etc.): Within normal limits       Impulse Control: Within normal limits         Cognition (Link to [facility])    Orientation (person, place, time): Within normal limits      Recent and remote memory: Within normal limits           Attention span and concentration: Within normal limits         Fund of knowledge, awareness of current events and vocabulary: Not formally assessed; appear intact      Judgment: Within normal limits          Insight: Moderate to good        Suicidality/Homicidality/Aggression (Victimization or Perpetration): denies/denies/denies     ASSESSMENT   Today's Assessment:  Pt is not at risk of harming herself or others, continuing to discuss her past SI and father's suicidality & its effects on her    Risk Level Assessment  Risk Level Change (if yes, please describe): No    Suicide: low (1)  Violence: low (1)  Addiction: low (1)          DIAGNOSES ASSESSED TODAY (psychiatric diagnoses and medical diagnoses that factor into management of psychiatric treatment): PTSD, Dissociative disorder NOS; MDD, recurrent, moderate    CLINICAL FORMULATION (Make changes as your  understanding changes. 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 Pt's strengths include intelligence, sense of humor, perseverence.    REVIEWING TODAY'S VISIT  CLINICAL INTERVENTIONS TODAY:   Gathered information  Offered support  Assessed sx and risk, especially suicidality  Discussed above topics  Discussed therapy ruptures and repairs  Explored methods/interventions that work and don't feel right for pt  Explored and helped pt express emotions that are difficult to access in order to help pt to metabolize and build connections hampered by early trauma  Discussed uses of both intellectual and emotional working through of trauma and attachment rupture    PATIENT'S RESPONSE TO INTERVENTIONS: Pt was engaged and responsive    PROGRESS TOWARDS GOALS: moderate    TIME SPENT IN PSYCHOTHERAPY: 45 min    PLAN  PLAN FOR MANAGING RISK (Consider risk plan for patients at moderate or high risk for suicide/violence/addiction; medication plan; referrals, etc. Must coincide with treatment plan.): N/A    PLAN FOR ONGOING TREATMENT:    1. Add an additional weekly psychotherapy session until sx severity more manageable for pt or pt can attend day tx program at [place], then return to lower frequency /week   2. Referral to [place] day tx program if appropriate  3. Referral to [facility]feedback tx if available at [facility]  4. Continue Self-Compassion group with [name]  5. Referral to  psychopharm made 3/17 -> was referred forward to [facility] 3/20  6. Consult with prescriber and PCP as needed     INFORMED CONSENT (for any new treatment): Patient was informed of the potential risks and benefits of the treatment, including the option not to treat, and appeared to understand and agreed to comply. Discussion included the following key points: N/A    [name], PhD, LICSW

## 2015-11-07 ENCOUNTER — Ambulatory Visit (HOSPITAL_BASED_OUTPATIENT_CLINIC_OR_DEPARTMENT_OTHER): Payer: MEDICARE | Admitting: Clinical

## 2015-11-07 DIAGNOSIS — F449 Dissociative and conversion disorder, unspecified: Secondary | ICD-10-CM

## 2015-11-07 NOTE — Progress Notes (Signed)
..  OUTPATIENT PSYCHIATRY GROUP PROGRESS NOTE    Patient Name: Brianna [name]    Leaders: Analeese Andreatta    Service Type: [phone] Group Psychotherapy     Length of Group: 75 minutes     Number of participants in group today: 2        Purpose of Group (choose all that apply):   Symptom relief  Affect regulation  Interpersonal skill development  Support (psychological, family, community resources)  Insight and behavior change         Group Process:  Group #5  ; Mindfulness/; Kindness    Individual Patient Participation:    Attentive to process  Active participant  Asked appropriate questions  Contributed constructively  Engaged in topic  Identified with peers  Shared personal experience related to subject addressed in group  Supportive of other members    Diagnosis (addressed by this group):  PTSD    Medical Necessity of Session (how treatment is necessary to improve symptoms, functioning, or prevent worsening): symptom reduction    Relevant Changes in Mental Status: No.     Risk Level per Scale:  Suicide: low  Violence: low  Addiction: low    Current risk level represents increase in risk: No.                                                   [name], LICSW                                  11/07/2015

## 2015-11-08 ENCOUNTER — Ambulatory Visit (HOSPITAL_BASED_OUTPATIENT_CLINIC_OR_DEPARTMENT_OTHER): Payer: MEDICARE | Admitting: Social Worker

## 2015-11-08 DIAGNOSIS — F449 Dissociative and conversion disorder, unspecified: Principal | ICD-10-CM

## 2015-11-08 NOTE — Progress Notes (Signed)
Brianna KitchenOUTPATIENT PSYCHIATRY PROGRESS NOTE    INTERPRETER: No    CONTACT INFO FOR OTHER AGENCIES AND MENTAL HEALTH PROVIDERS (IF APPLICABLE): Outside trauma-specialist therapist [name], Psy.D, [phone]; group therapist [name]    PROBLEM(S) ADDRESSED IN THIS SESSION:   1. Level of functioning, flashbacks, dissociative experiences and behaviors  2. Strategies for working in therapy and past therapy experiences  3. Father's suicidal ideation, ECT and it's effects on pt  4.       SUBJECTIVE  TODAY'S CHIEF COMPLAINT AND CLINICAL UPDATES IN PATIENT'S WORDS:  1) Chief Complaint (Patient and/or guardian's own words, concerns and expressed thoughts): "What's positive about you hasn't yet outweighed all the long-term past negative"    2) New information from patient and/or collateral (Patient's illness: context, course, modifying factors, severity, cultural, family, social, medical history):   Pt was on time to the appointment  Sent story she wrote referencing childhood experiences and connections she had made to father's suicidality, depression and ECT tx - didn't get to discussing it on an emotional level, but agreed we will keep the story 'at hand' to return to in future sessions  Expressing appreciation/idealization of current therapy and therapist "I feel if I had had you as a therapist twenty years ago, I'd have a family and a career now" "I value you as both a person and a therapist"  Another topic we agree to try to put on the agenda consciously is the list of therapist responses from the past and this therapist's open response to them  Pt notices how difficult it is for her to bring up topics she feels therapist might have a nonvalidating/defensive/threatened response to  We also discuss frequency of meetings - from frame, utility and felt value points of view - will return to this - pt able to notice she has 'cross-currents' of feeling to this; both a compliant, matter-of-fact response and a sadness she  hasn't felt was an allowed/possible response in the past      OBJECTIVE  DATA REVIEWED (Consider medical labs, radiology, other medical tests; screening/outcome measures; psychological testing; discussion of test results with other clinicians; consultation with other clinicians and systems involved with patient, summary of old records): [place] (make clear medications prescribed by psychiatry; include OTC medications):    Current Outpatient Prescriptions:  citalopram (CELEXA) 10 MG tablet Take 1 tablet by mouth daily 2 tabs daily. For: depression and anxiety. Disp: 60 tablet Rfl: 0   propranolol (INDERAL) 10 MG tablet Take 1 tablet by mouth 3 (three) times daily as needed For rapid heart rate or anxiety symptoms. Disp: 90 tablet Rfl: 3   THEANINE PO Take  by mouth. Disp:  Rfl:    Multiple Vitamin (MULTIVITAMINS PO)  Disp:  Rfl:      No current facility-administered medications for this visit.     MEDICATION ADHERENCE (including barriers and how addressed): yes     MEDICATION SIDE EFFECTS (Prescribers Only): N/A    BIRTH CONTROL (ask females and males): N/A    CURRENT PREGNANCY: N/A      MENTAL STATUS EXAMINATION                     General Appearance: Groomed white woman wearing weather appropriate clothing, graying shoulder-length hair     Interaction with Interviewer (eye contact, attitude, behavior): Fair to good eye contact, open - using humor    Physical Signs    Gait and Station (how patient walks  and stands): Within normal limits                  Physical Appearance: Within normal limits          Normal Movements: Within normal limits         Speech (rate, volume, articulation): Within normal limits          Language: Within normal limits                       Mood: "mixed feelings"            Affect: Congruous with content, variable, not tearful today    Thought Process     Rate; concrete vs abstract reasoning: Within normal limits        Logical vs illogical; associations:  loose, tangential, circumstantial, intact: Pt notes she is/can be mildly tangential at times                                    Thought Content    Normal Thought Content (other than safety): Within normal limits     Perceptions (auditory, visual, tactile, etc.): Within normal limits       Impulse Control: Within normal limits         Cognition (Link to [facility])    Orientation (person, place, time): Within normal limits      Recent and remote memory: Within normal limits           Attention span and concentration: Within normal limits         Fund of knowledge, awareness of current events and vocabulary: Not formally assessed; appear intact      Judgment: Within normal limits          Insight: Moderate to good        Suicidality/Homicidality/Aggression (Victimization or Perpetration): denies/denies/denies     ASSESSMENT   Today's Assessment:  Pt is not at risk of harming herself or others, continuing to allude to father's suicidality & its effects on her, appreciative of current therapy    Risk Level Assessment  Risk Level Change (if yes, please describe): No    Suicide: low (1)  Violence: low (1)  Addiction: low (1)          DIAGNOSES ASSESSED TODAY (psychiatric diagnoses and medical diagnoses that factor into management of psychiatric treatment): PTSD, Dissociative disorder NOS; MDD, recurrent, moderate    CLINICAL FORMULATION (Make changes as your understanding changes. 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 Pt's strengths include intelligence, sense of humor, perseverence.    REVIEWING TODAY'S VISIT  CLINICAL INTERVENTIONS TODAY:   Gathered information  Offered support  Assessed sx and risk, especially suicidality  Discussed above topics  Discussed therapy ruptures and repairs  Explored methods/interventions that work and don't feel right for pt  Explored and helped pt express emotions that are difficult to access in order to help pt to metabolize and build connections hampered by  early trauma  Discussed uses of both intellectual and emotional working through of trauma and attachment rupture    PATIENT'S RESPONSE TO INTERVENTIONS: Pt was engaged and responsive    PROGRESS TOWARDS GOALS: good    TIME SPENT IN PSYCHOTHERAPY: 45 min    [place] (Consider risk plan for patients at moderate or high risk for suicide/violence/addiction; medication plan; referrals, etc. Must coincide with treatment plan.): N/A    PLAN FOR ONGOING TREATMENT:    1. Add an additional weekly psychotherapy session until sx severity more manageable for pt or pt can attend day tx program at [place], then return to lower frequency /week   2. Referral to [facility] day tx program if appropriate  3. Referral to neurofeedback tx if available at [facility]  4. Continue Self-Compassion group with [name]  5. Referral to psychopharm made 3/17 -> was referred forward to [facility] psych 3/20  6. Consult with prescriber and PCP as needed     INFORMED CONSENT (for any new treatment): Patient was informed of the potential risks and benefits of the treatment, including the option not to treat, and appeared to understand and agreed to comply. Discussion included the following key points: N/A    [name], PhD, LICSW

## 2015-11-09 NOTE — Progress Notes (Signed)
Appointment set up for psychopharm intake with Felix Pacini, NP, [facility] on 11/29/15.

## 2015-11-13 ENCOUNTER — Ambulatory Visit (HOSPITAL_BASED_OUTPATIENT_CLINIC_OR_DEPARTMENT_OTHER): Payer: MEDICARE | Admitting: Social Worker

## 2015-11-13 DIAGNOSIS — F449 Dissociative and conversion disorder, unspecified: Principal | ICD-10-CM

## 2015-11-13 NOTE — Progress Notes (Signed)
Marland KitchenOUTPATIENT PSYCHIATRY PROGRESS NOTE    INTERPRETER: No    CONTACT INFO FOR OTHER AGENCIES AND MENTAL HEALTH PROVIDERS (IF APPLICABLE): Outside trauma-specialist therapist [name], Psy.D, [phone]; group therapist [name]    PROBLEM(S) ADDRESSED IN THIS SESSION:   1. Level of functioning, flashbacks, dissociative experiences and behaviors  2. Countertransference experiences in therapy  3. Upcoming presentations on clinical/personal topics  4.       SUBJECTIVE  TODAY'S CHIEF COMPLAINT AND CLINICAL UPDATES IN PATIENT'S WORDS:  1) Chief Complaint (Patient and/or guardian's own words, concerns and expressed thoughts): "I'm about to give these two presentations and doing that has both upsides and downsides"    2) New information from patient and/or collateral (Patient's illness: context, course, modifying factors, severity, cultural, family, social, medical history):   Pt was on time to the appointment  Focus today on questions of therapist countertransference reactions, how they have affected pt, and what she'd like to learn about her own part in these - we have list of countertransference reactions by previous therapist, and pt would like to spend time this week looking at them  This ties into a presentation and a conference poster pt has set up for this year, where she is presenting on the topics most pertinent to her personally but in a more removed, theoretical/intellectual way - presenting is still difficult and can feel "retraumatizing with a small r" to pt  We discuss the ways intellectual understanding such as spending time on these presentations could be in the service of pt's emotional processing of her trauma hx and could ideally provide the 'scaffolding' around which her emotional growth could develop and create the wished for intertwining/knitting together of her separate levels of experience  We also discuss a few interactions in this therapy that pt has experienced complicated  emotions/difficulties with; in what seems like a collaborative, curious way    OBJECTIVE  DATA REVIEWED (Consider medical labs, radiology, other medical tests; screening/outcome measures; psychological testing; discussion of test results with other clinicians; consultation with other clinicians and systems involved with patient, summary of old records): Consulted EMR    CURRENT MEDICATIONS (make clear medications prescribed by psychiatry; include OTC medications):    Current Outpatient Prescriptions:  citalopram (CELEXA) 10 MG tablet Take 1 tablet by mouth daily 2 tabs daily. For: depression and anxiety. Disp: 60 tablet Rfl: 0   propranolol (INDERAL) 10 MG tablet Take 1 tablet by mouth 3 (three) times daily as needed For rapid heart rate or anxiety symptoms. Disp: 90 tablet Rfl: 3   THEANINE PO Take  by mouth. Disp:  Rfl:    Multiple Vitamin (MULTIVITAMINS PO)  Disp:  Rfl:      No current facility-administered medications for this visit.     MEDICATION ADHERENCE (including barriers and how addressed): yes     MEDICATION SIDE EFFECTS (Prescribers Only): N/A    BIRTH CONTROL (ask females and males): N/A    CURRENT PREGNANCY: N/A      MENTAL STATUS EXAMINATION                     General Appearance: Groomed white woman wearing weather appropriate clothing, graying medium-length hair     Interaction with Interviewer (eye contact, attitude, behavior): Fair to good eye contact, open - using humor    Physical Signs    Gait and Station (how patient walks and stands): Within normal limits  Physical Appearance: Within normal limits          Normal Movements: Within normal limits         Speech (rate, volume, articulation): Within normal limits          Language: Within normal limits                       Mood: "okay"            Affect: Congruous with content, variable, not tearful today    Thought Process     Rate; concrete vs abstract reasoning: Within normal limits        Logical vs illogical; associations:  loose, tangential, circumstantial, intact: Pt notes she is/can be mildly tangential at times                                    Thought Content    Normal Thought Content (other than safety): Within normal limits     Perceptions (auditory, visual, tactile, etc.): Within normal limits       Impulse Control: Within normal limits         Cognition (Link to [facility])    Orientation (person, place, time): Within normal limits      Recent and remote memory: Within normal limits           Attention span and concentration: Within normal limits         Fund of knowledge, awareness of current events and vocabulary: Not formally assessed; appear intact      Judgment: Within normal limits          Insight: Moderate to good        Suicidality/Homicidality/Aggression (Victimization or Perpetration): denies/denies/denies     ASSESSMENT   Today's Assessment:  Pt is not at risk of harming herself or others, concerned about ways doing intellectual work on her experiences for presentations could be emotionally triggering    Risk Level Assessment  Risk Level Change (if yes, please describe): No    Suicide: low (1)  Violence: low (1)  Addiction: low (1)          DIAGNOSES ASSESSED TODAY (psychiatric diagnoses and medical diagnoses that factor into management of psychiatric treatment): PTSD, Dissociative disorder NOS; MDD, recurrent, moderate    CLINICAL FORMULATION (Make changes as your understanding changes. 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 Pt's strengths include intelligence, sense of humor, perseverence.    REVIEWING TODAY'S VISIT  CLINICAL INTERVENTIONS TODAY:   Gathered information  Offered support  Assessed sx and risk, especially suicidality  Discussed above topics  Discussed therapy ruptures and repairs  Explored methods/interventions that work and don't feel right for pt  Explored and helped pt express emotions that are difficult to access in order to help pt to metabolize and build connections hampered by early trauma  Discussed uses of both intellectual and emotional working through of trauma and attachment rupture  PATIENT'S RESPONSE TO INTERVENTIONS: Pt was engaged and responsive    PROGRESS TOWARDS GOALS: moderate    TIME SPENT IN PSYCHOTHERAPY: 45 min    PLAN  PLAN FOR MANAGING RISK (Consider risk plan for patients at moderate or high risk for suicide/violence/addiction; medication plan; referrals, etc. Must coincide with treatment plan.): N/A    PLAN FOR ONGOING TREATMENT:    1. Return to lower tx frequency /week after this week, as pt's sx appear to have been stabilized to the extent that she doesn't feel the need to attend a day tx program at this time  2. Referral to [facility] day tx program if/when appropriate  3. Referral to neurofeedback tx if available at [facility]  4. Continue Self-Compassion group with [name]  5. Referral to psychopharm -> pt scheduled for 4/20  6. Consult with prescriber and PCP as needed     INFORMED CONSENT (for any new treatment): Patient was informed of the potential risks and benefits of the treatment, including the option not to treat, and appeared to understand and agreed to comply. Discussion included the following key points: N/A    [name], PhD, LICSW

## 2015-11-14 ENCOUNTER — Ambulatory Visit (HOSPITAL_BASED_OUTPATIENT_CLINIC_OR_DEPARTMENT_OTHER): Payer: MEDICARE | Admitting: Social Worker

## 2015-11-14 ENCOUNTER — Ambulatory Visit (HOSPITAL_BASED_OUTPATIENT_CLINIC_OR_DEPARTMENT_OTHER): Payer: MEDICARE | Admitting: Clinical

## 2015-11-14 DIAGNOSIS — F449 Dissociative and conversion disorder, unspecified: Secondary | ICD-10-CM
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
## 2015-11-15 ENCOUNTER — Ambulatory Visit (HOSPITAL_BASED_OUTPATIENT_CLINIC_OR_DEPARTMENT_OTHER): Payer: MEDICARE | Admitting: Social Worker

## 2015-11-15 DIAGNOSIS — F449 Dissociative and conversion disorder, unspecified: Secondary | ICD-10-CM
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
## 2015-11-20 ENCOUNTER — Ambulatory Visit (HOSPITAL_BASED_OUTPATIENT_CLINIC_OR_DEPARTMENT_OTHER): Payer: MEDICARE | Admitting: Social Worker

## 2015-11-20 DIAGNOSIS — F449 Dissociative and conversion disorder, unspecified: Principal | ICD-10-CM

## 2015-11-20 NOTE — Progress Notes (Signed)
Marland KitchenOUTPATIENT PSYCHIATRY PROGRESS NOTE    INTERPRETER: No    CONTACT INFO FOR OTHER AGENCIES AND MENTAL HEALTH PROVIDERS (IF APPLICABLE): Outside trauma-specialist therapist [name], Psy.D, [phone]; group therapist [name]    PROBLEM(S) ADDRESSED IN THIS SESSION:   1. Level of functioning, flashbacks, dissociative experiences and behaviors  2. Ways of working about discrete childhood trauma experience  3. Processing trauma and building connections btw emotional and intellectual sense-making  4.       SUBJECTIVE  TODAY'S CHIEF COMPLAINT AND CLINICAL UPDATES IN PATIENT'S WORDS:  1) Chief Complaint (Patient and/or guardian's own words, concerns and expressed thoughts): "I'm still undecided about [facility] but interested in it and neurofeedback"    2) New information from patient and/or collateral (Patient's illness: context, course, modifying factors, severity, cultural, family, social, medical history):   Pt was on time to the appointment  Continuing on themes of last session about childhood trauma  After moving down to two sessions/week this week, pt has some reactions - thinking about changing the way she needs to work in tx, wondering about moving away from the [place] area in the future, but also returning to [facility] auxiliary tx she hopes could be helpful in integrating trauma experience [facility] day tx program; neurofeedback); asking about more phone availability  Discussing the importance of therapist disclosing of countertransference/own reactions as an antidote to the "gaslighting" about own needs and emotions that pt feels happened to her in childhood  Also continuing importance of "experiencing through the other" - continuing to pay attention int he moment of pt's emotional (dissociated?) states/responses      OBJECTIVE  DATA REVIEWED (Consider medical labs, radiology, other medical tests; screening/outcome measures; psychological testing; discussion of test results with other clinicians; consultation  with other clinicians and systems involved with patient, summary of old records): Consulted EMR    CURRENT MEDICATIONS (make clear medications prescribed by psychiatry; include OTC medications):    Current Outpatient Prescriptions:  citalopram (CELEXA) 10 MG tablet Take 1 tablet by mouth daily 2 tabs daily. For: depression and anxiety. Disp: 60 tablet Rfl: 0   propranolol (INDERAL) 10 MG tablet Take 1 tablet by mouth 3 (three) times daily as needed For rapid heart rate or anxiety symptoms. Disp: 90 tablet Rfl: 3   THEANINE PO Take  by mouth. Disp:  Rfl:    Multiple Vitamin (MULTIVITAMINS PO)  Disp:  Rfl:      No current facility-administered medications for this visit.     MEDICATION ADHERENCE (including barriers and how addressed): yes     MEDICATION SIDE EFFECTS (Prescribers Only): N/A    BIRTH CONTROL (ask females and males): N/A    CURRENT PREGNANCY: N/A      MENTAL STATUS EXAMINATION                     General Appearance: Groomed white woman wearing weather appropriate clothing, graying medium-length hair     Interaction with Interviewer (eye contact, attitude, behavior): Fair to good eye contact, open - using humor    Physical Signs    Gait and Station (how patient walks and stands): Within normal limits                  Physical Appearance: Within normal limits          Normal Movements: Within normal limits         Speech (rate, volume, articulation): Within normal limits  Language: Within normal limits                       Mood: "sad"            Affect: Congruous with content, variable, briefly tearful today    Thought Process     Rate; concrete vs abstract reasoning: Within normal limits        Logical vs illogical; associations: loose, tangential, circumstantial, intact: Pt notes she is/can be mildly tangential at times                                    Thought Content    Normal Thought Content (other than safety): Within normal limits     Perceptions (auditory, visual, tactile, etc.): Within  normal limits       Impulse Control: Within normal limits         Cognition (Link to [facility])    Orientation (person, place, time): Within normal limits      Recent and remote memory: Within normal limits           Attention span and concentration: Within normal limits         Fund of knowledge, awareness of current events and vocabulary: Not formally assessed; appear intact      Judgment: Within normal limits          Insight: Moderate to good        Suicidality/Homicidality/Aggression (Victimization or Perpetration): denies/denies/denies     ASSESSMENT   Today's Assessment:  Pt is not at risk of harming herself or others, talking of ways to move therapy forward to touching on important topics    Risk Level Assessment  Risk Level Change (if yes, please describe): No    Suicide: low (1)  Violence: low (1)  Addiction: low (1)          DIAGNOSES ASSESSED TODAY (psychiatric diagnoses and medical diagnoses that factor into management of psychiatric treatment): PTSD, Dissociative disorder NOS; MDD, recurrent, moderate    CLINICAL FORMULATION (Make changes as your understanding changes. 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 Pt's strengths include intelligence, sense of humor, perseverence.    REVIEWING TODAY'S VISIT  CLINICAL INTERVENTIONS TODAY:   Gathered information  Offered support  Assessed sx and risk, especially suicidality  Discussed  above topics  Discussed therapy ruptures and repairs  Explored methods/interventions that work and don't feel right for pt  Explored and helped pt express emotions that are difficult to access in order to help pt to metabolize and build connections hampered by early trauma  Discussed uses of both intellectual and emotional working through of trauma and attachment rupture    PATIENT'S RESPONSE TO INTERVENTIONS: Pt was engaged and responsive    PROGRESS TOWARDS GOALS: some    TIME SPENT IN PSYCHOTHERAPY: 45 min    [place] (Consider risk plan for patients at moderate or high risk for suicide/violence/addiction;  medication plan; referrals, etc. Must coincide with treatment plan.): N/A    PLAN FOR ONGOING TREATMENT:    1. Continue tx 2x/week   2. Referral to [facility] day tx program if/when appropriate  3. Referral to neurofeedback tx if available at [facility]  4. Continue Self-Compassion group with [name]  5. Referral to psychopharm -> pt scheduled for 4/20  6. Consult with prescriber and PCP as needed     INFORMED CONSENT (for any new treatment): Patient was informed of the potential risks and benefits of the treatment, including the option not to treat, and appeared to understand and agreed to comply. Discussion included the following key points: N/A    [name], PhD, LICSW

## 2015-11-22 ENCOUNTER — Ambulatory Visit (HOSPITAL_BASED_OUTPATIENT_CLINIC_OR_DEPARTMENT_OTHER): Payer: MEDICARE | Admitting: Social Worker

## 2015-11-22 DIAGNOSIS — F449 Dissociative and conversion disorder, unspecified: Secondary | ICD-10-CM

## 2015-11-22 NOTE — Progress Notes (Signed)
Marland KitchenOUTPATIENT PSYCHIATRY PROGRESS NOTE    INTERPRETER: No    CONTACT INFO FOR OTHER AGENCIES AND MENTAL HEALTH PROVIDERS (IF APPLICABLE): Outside trauma-specialist therapist [name], Psy.D, [phone]; group therapist [name]    PROBLEM(S) ADDRESSED IN THIS SESSION:   1. Level of functioning, flashbacks, dissociative experiences and behaviors  2. Ways of working about discrete childhood trauma experience  3. Processing trauma and building connections btw emotional and intellectual sense-making  4.       SUBJECTIVE  TODAY'S CHIEF COMPLAINT AND CLINICAL UPDATES IN PATIENT'S WORDS:  1) Chief Complaint (Patient and/or guardian's own words, concerns and expressed thoughts): "I'm thinking of moving away because weather affects me so much"    2) New information from patient and/or collateral (Patient's illness: context, course, modifying factors, severity, cultural, family, social, medical history):   Pt was on time to the appointment  Continuing to work on ways emotional/dissociative experience could be integrated with pt's thinking/symbolic functioning and the ways in which experiencing through the other/therapist who could provide metabolizing/mentalizing functions could help pt develop those in herself  Discuss in more length pt's desire to move away, and what that would do to her sense of connectedness/disconnectedness  Importance of attending to tears/emotion concretely when it is in the room      OBJECTIVE  DATA REVIEWED (Consider medical labs, radiology, other medical tests; screening/outcome measures; psychological testing; discussion of test results with other clinicians; consultation with other clinicians and systems involved with patient, summary of old records): Consulted EMR    CURRENT MEDICATIONS (make clear medications prescribed by psychiatry; include OTC medications):    Current Outpatient Prescriptions:  citalopram (CELEXA) 10 MG tablet Take 1 tablet by mouth daily 2 tabs daily. For: depression  and anxiety. Disp: 60 tablet Rfl: 0   propranolol (INDERAL) 10 MG tablet Take 1 tablet by mouth 3 (three) times daily as needed For rapid heart rate or anxiety symptoms. Disp: 90 tablet Rfl: 3   THEANINE PO Take  by mouth. Disp:  Rfl:    Multiple Vitamin (MULTIVITAMINS PO)  Disp:  Rfl:      No current facility-administered medications for this visit.     MEDICATION ADHERENCE (including barriers and how addressed): yes     MEDICATION SIDE EFFECTS (Prescribers Only): N/A    BIRTH CONTROL (ask females and males): N/A    CURRENT PREGNANCY: N/A      MENTAL STATUS EXAMINATION                     General Appearance: Groomed white woman wearing weather appropriate clothing, graying medium-length hair     Interaction with Interviewer (eye contact, attitude, behavior): Fair to good eye contact, open - using humor    Physical Signs    Gait and Station (how patient walks and stands): Within normal limits                  Physical Appearance: Within normal limits          Normal Movements: Within normal limits         Speech (rate, volume, articulation): Within normal limits          Language: Within normal limits                       Mood: "distracted"            Affect: Congruous with content, variable, tearful at times    Thought Process  Rate; concrete vs abstract reasoning: Within normal limits        Logical vs illogical; associations: loose, tangential, circumstantial, intact: Pt notes she is/can be mildly tangential at times                                    Thought Content    Normal Thought Content (other than safety): Within normal limits     Perceptions (auditory, visual, tactile, etc.): Within normal limits       Impulse Control: Within normal limits         Cognition (Link to [facility])    Orientation (person, place, time): Within normal limits      Recent and remote memory: Within normal limits           Attention span and concentration: Within normal limits         Fund of knowledge, awareness of current events  and vocabulary: Not formally assessed; appear intact      Judgment: Within normal limits          Insight: Moderate to good        Suicidality/Homicidality/Aggression (Victimization or Perpetration): denies/denies/denies     ASSESSMENT   Today's Assessment:  Pt is not at risk of harming herself or others, does not appear to be feeling as much of a need to explore further/auxiliary tx as a few days ago    Risk Level Assessment  Risk Level Change (if yes, please describe): No    Suicide: low (1)  Violence: low (1)  Addiction: low (1)          DIAGNOSES ASSESSED TODAY (psychiatric diagnoses and medical diagnoses that factor into management of psychiatric treatment): PTSD, Dissociative disorder NOS; MDD, recurrent, moderate    CLINICAL FORMULATION (Make changes as your understanding changes. 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 Pt's strengths include intelligence, sense of humor, perseverence.    REVIEWING TODAY'S VISIT  CLINICAL INTERVENTIONS TODAY:   Gathered information  Offered support  Assessed sx and risk, especially suicidality  Discussed above topics  Discussed therapy ruptures and repairs  Explored methods/interventions that work and don't feel right for pt  Explored and helped pt express emotions that are difficult to access in order to help pt to metabolize and build connections hampered by early trauma  Discussed uses of both  intellectual and emotional working through of trauma and attachment rupture    PATIENT'S RESPONSE TO INTERVENTIONS: Pt was engaged and responsive    PROGRESS TOWARDS GOALS: some    TIME SPENT IN PSYCHOTHERAPY: 45 min    [place] (Consider risk plan for patients at moderate or high risk for suicide/violence/addiction; medication plan; referrals, etc. Must coincide with treatment plan.): N/A    PLAN FOR ONGOING TREATMENT:    1. Continue tx 2x/week   2. Referral to [facility] day tx program if/when appropriate  3. Referral to [facility]feedback tx if available at [facility]  4. Continue  Self-Compassion group with [name]  5. Referral to psychopharm -> pt scheduled for 4/20  6. Consult with prescriber and PCP as needed     INFORMED CONSENT (for any new treatment): Patient was informed of the potential risks and benefits of the treatment, including the option not to treat, and appeared to understand and agreed to comply. Discussion included the following key points: N/A    [name], PhD, LICSW

## 2015-11-27 ENCOUNTER — Ambulatory Visit (HOSPITAL_BASED_OUTPATIENT_CLINIC_OR_DEPARTMENT_OTHER): Payer: MEDICARE | Admitting: Social Worker

## 2015-11-27 DIAGNOSIS — F449 Dissociative and conversion disorder, unspecified: Principal | ICD-10-CM

## 2015-11-27 NOTE — Progress Notes (Signed)
..OUTPATIENT PSYCHIATRY PROGRESS NOTE    INTERPRETER: No    CONTACT INFO FOR OTHER AGENCIES AND MENTAL HEALTH PROVIDERS (IF APPLICABLE): Outside trauma-specialist therapist [name], Psy.D, [phone]; group therapist [name]    PROBLEM(S) ADDRESSED IN THIS SESSION:   1. Level of functioning, flashbacks, dissociative experiences and behaviors  2. Wish to move somewhere warmer / possible effects of move  3. Processing trauma and building connections btw emotional and intellectual sense-making  4.       SUBJECTIVE  TODAY'S CHIEF COMPLAINT AND CLINICAL UPDATES IN PATIENT'S WORDS:  1) Chief Complaint (Patient and/or guardian's own words, concerns and expressed thoughts): "The kind of help I think I most need is kindness in terms of rhythmic third ... 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 as well as misattunements in the room  Pt able to express her ambivalence to wanting/needing help and having to ask for it      OBJECTIVE  DATA REVIEWED (Consider medical labs, radiology, other medical tests; screening/outcome measures; psychological testing; discussion of test results with other clinicians; consultation with other clinicians and systems involved with patient, summary of old records): Consulted EMR    CURRENT MEDICATIONS (make clear medications prescribed by psychiatry; include OTC medications):    Current Outpatient Prescriptions:  citalopram (CELEXA) 10 MG tablet Take 1 tablet by mouth daily 2 tabs daily. For: depression and anxiety. Disp: 60 tablet Rfl: 0    propranolol (INDERAL) 10 MG tablet Take 1 tablet by mouth 3 (three) times daily as needed For rapid heart rate or anxiety symptoms. Disp: 90 tablet Rfl: 3   THEANINE PO Take  by mouth. Disp:  Rfl:    Multiple Vitamin (MULTIVITAMINS PO)  Disp:  Rfl:      No current facility-administered medications for this visit.     MEDICATION ADHERENCE (including barriers and how addressed): yes     MEDICATION SIDE EFFECTS (Prescribers Only): N/A    BIRTH CONTROL (ask females and males): N/A    CURRENT PREGNANCY: N/A      MENTAL STATUS EXAMINATION                     General Appearance: Groomed white woman wearing weather appropriate clothing, graying medium-length hair     Interaction with Interviewer (eye contact, attitude, behavior): Fair to good eye contact, open - using some humor    Physical Signs    Gait and Station (how patient walks and stands): Within normal limits                  Physical Appearance: Within normal limits          Normal Movements: Within normal limits         Speech (rate, volume, articulation): Within normal limits          Language: Within normal limits                       Mood: "tired"            Affect: Congruous with content, variable, tearful at  times    Thought Process     Rate; concrete vs abstract reasoning: Within normal limits        Logical vs illogical; associations: loose, tangential, circumstantial, intact: Pt notes she is/can be mildly tangential at times                                    Thought Content    Normal Thought Content (other than safety): Within normal limits     Perceptions (auditory, visual, tactile, etc.): Within normal limits       Impulse Control: Within normal limits         Cognition (Link to [facility])    Orientation (person, place, time): Within normal limits      Recent and remote memory: Within normal limits           Attention span and concentration: Within normal limits         Fund of knowledge, awareness of current events and vocabulary: Not formally assessed;  appear intact      Judgment: Within normal limits          Insight: Moderate to good        Suicidality/Homicidality/Aggression (Victimization or Perpetration): denies/denies/denies     ASSESSMENT   Today's Assessment:  Pt is not at risk of harming herself or others, continuing to discuss ways in which she is hoping she could make progress so she wouldn't need to be in tx "for the rest of her life"    Risk Level Assessment  Risk Level Change (if yes, please describe): No    Suicide: low (1)  Violence: low (1)  Addiction: low (1)          DIAGNOSES ASSESSED TODAY (psychiatric diagnoses and medical diagnoses that factor into management of psychiatric treatment): PTSD, Dissociative disorder NOS; MDD, recurrent, moderate    CLINICAL FORMULATION (Make changes as your understanding changes. 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 Pt's strengths include intelligence, sense of humor, perseverence.    REVIEWING TODAY'S VISIT  CLINICAL INTERVENTIONS TODAY:   Gathered information  Offered support  Assessed sx and risk, especially suicidality  Discussed above topics  Discussed therapy ruptures and repairs  Explored methods/interventions that work and don't feel right for pt  Explored and helped pt express emotions that are difficult to access in order to help pt to metabolize and build connections hampered by early trauma  Discussed uses of both  intellectual and emotional working through of trauma and attachment rupture    PATIENT'S RESPONSE TO INTERVENTIONS: Pt was engaged and responsive    PROGRESS TOWARDS GOALS: moderate    TIME SPENT IN PSYCHOTHERAPY: 45 min    PLAN  PLAN FOR MANAGING RISK (Consider risk plan for patients at moderate or high risk for suicide/violence/addiction; medication plan; referrals, etc. Must coincide with treatment plan.): N/A    PLAN FOR ONGOING TREATMENT:    1. Continue tx 2x/week   2. Referral to [facility] day  tx program if/when appropriate  3. Referral to [facility]feedback tx if available at [facility]  4. Continue Self-Compassion group with [name]  5. Referral to psychopharm -> pt scheduled for 4/20  6. Consult with prescriber and PCP as needed     INFORMED CONSENT (for any new treatment): Patient was informed of the potential risks and benefits of the treatment, including the option not to treat, and appeared to understand and agreed to comply. Discussion included the following key points: N/A    [name], PhD, LICSW

## 2015-11-29 ENCOUNTER — Ambulatory Visit (HOSPITAL_BASED_OUTPATIENT_CLINIC_OR_DEPARTMENT_OTHER): Payer: MEDICARE | Admitting: "Psychiatric/Mental Health

## 2015-11-29 ENCOUNTER — Ambulatory Visit (HOSPITAL_BASED_OUTPATIENT_CLINIC_OR_DEPARTMENT_OTHER): Payer: MEDICARE | Admitting: Social Worker

## 2015-11-29 DIAGNOSIS — F331 Major depressive disorder, recurrent, moderate: Secondary | ICD-10-CM

## 2015-11-29 DIAGNOSIS — F449 Dissociative and conversion disorder, unspecified: Principal | ICD-10-CM

## 2015-11-29 NOTE — Progress Notes (Signed)
...OUTPATIENT PSYCHIATRY PROGRESS NOTE    INTERPRETER: No    CONTACT INFO FOR OTHER AGENCIES AND MENTAL HEALTH PROVIDERS (IF APPLICABLE): Outside trauma-specialist therapist [name], Psy.D, [phone]; group therapist [name]; psychopharm TBD    PROBLEM(S) ADDRESSED IN THIS SESSION:   1. Level of functioning, flashbacks, dissociative experiences and behaviors  2. Processing trauma and building connections btw emotional and intellectual sense-making  3. Using therapy relationship in ways   4.       SUBJECTIVE  TODAY'S CHIEF COMPLAINT AND CLINICAL UPDATES IN PATIENT'S WORDS:  1) Chief Complaint (Patient and/or guardian's own words, concerns and expressed thoughts): "I have to decide whether to do this presentation or not"    2) New information from patient and/or collateral (Patient's illness: context, course, modifying factors, severity, cultural, family, social, medical history):   Pt was on time to the appointment  Discussion of issues around pt's responses to therapist saying 'no' to some request and the relative haste with which pt's "gets it" and wants to move away from the topic "I'm sensitive in some things but when I'm not sensitive, I don't want to take the time and focus away from the real things I'm sensitive about"   Discussion of pt's upcoming presentation on clinical topic and concerns about this - pt expresses some confidence that with proper scaffolding/support, she wouldn't experience conference as difficult    OBJECTIVE  DATA REVIEWED (Consider medical labs, radiology, other medical tests; screening/outcome measures; psychological testing; discussion of test results with other clinicians; consultation with other clinicians and systems involved with patient, summary of old records): Consulted EMR    CURRENT MEDICATIONS (make clear medications prescribed by psychiatry; include OTC medications):    Current Outpatient Prescriptions:  citalopram (CELEXA) 10 MG tablet Take 1 tablet by mouth  daily 2 tabs daily. For: depression and anxiety. Disp: 60 tablet Rfl: 0   propranolol (INDERAL) 10 MG tablet Take 1 tablet by mouth 3 (three) times daily as needed For rapid heart rate or anxiety symptoms. Disp: 90 tablet Rfl: 3   THEANINE PO Take  by mouth. Disp:  Rfl:    Multiple Vitamin (MULTIVITAMINS PO)  Disp:  Rfl:      No current facility-administered medications for this visit.     MEDICATION ADHERENCE (including barriers and how addressed): yes     MEDICATION SIDE EFFECTS (Prescribers Only): N/A    BIRTH CONTROL (ask females and males): N/A    CURRENT PREGNANCY: N/A      MENTAL STATUS EXAMINATION                     General Appearance: Groomed white woman wearing weather appropriate clothing, graying medium-length hair     Interaction with Interviewer (eye contact, attitude, behavior): Fair to good eye contact, open - using some humor    Physical Signs    Gait and Station (how patient walks and stands): Within normal limits                  Physical Appearance: Within normal limits          Normal Movements: Within normal limits         Speech (rate, volume, articulation): Within normal limits          Language: Within normal limits                       Mood: "hesitant"  Affect: Congruous with content, variable, not tearful     Thought Process     Rate; concrete vs abstract reasoning: Within normal limits        Logical vs illogical; associations: loose, tangential, circumstantial, intact: Pt notes she is/can be mildly tangential at times                                    Thought Content    Normal Thought Content (other than safety): Within normal limits     Perceptions (auditory, visual, tactile, etc.): Within normal limits       Impulse Control: Within normal limits         Cognition (Link to [facility])    Orientation (person, place, time): Within normal limits      Recent and remote memory: Within normal limits           Attention span and concentration: Within normal limits         Fund of  knowledge, awareness of current events and vocabulary: Not formally assessed; appear intact      Judgment: Within normal limits          Insight: Moderate to good        Suicidality/Homicidality/Aggression (Victimization or Perpetration): denies/denies/denies     ASSESSMENT   Today's Assessment:  Pt is not at risk of harming herself or others, more intellectual, focused on making best use of tx possible    Risk Level Assessment  Risk Level Change (if yes, please describe): No    Suicide: low (1)  Violence: low (1)  Addiction: low (1)          DIAGNOSES ASSESSED TODAY (psychiatric diagnoses and medical diagnoses that factor into management of psychiatric treatment): Dissociative disorder NOS; MDD, recurrent, moderate; PTSD by hx    CLINICAL FORMULATION (Make changes as your understanding changes. 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 Pt's strengths include intelligence, sense of humor, perseverence.    REVIEWING TODAY'S VISIT  CLINICAL INTERVENTIONS TODAY:   Gathered information  Offered support  Assessed sx and risk, especially suicidality  Discussed above topics  Discussed therapy ruptures and repairs  Explored methods/interventions that work and don't feel right for pt  Explored and helped pt express emotions that are difficult to access in order to help pt to metabolize and build connections hampered by early trauma  Discussed uses  of both intellectual and emotional working through of trauma and attachment rupture    PATIENT'S RESPONSE TO INTERVENTIONS: Pt was engaged and responsive    PROGRESS TOWARDS GOALS: moderate    TIME SPENT IN PSYCHOTHERAPY: 45 min    PLAN  PLAN FOR MANAGING RISK (Consider risk plan for patients at moderate or high risk for suicide/violence/addiction; medication plan; referrals, etc. Must coincide with treatment plan.): N/A    PLAN FOR ONGOING TREATMENT:    1. Continue tx 2x/week   2. Referral to [facility] day tx program if/when appropriate  3. Referral to  [facility]feedback tx if available at [facility]  4. Consider further group tx as appropriate  5. Referral to psychopharm -> pt scheduled for 4/20  6. Consult with prescriber and PCP as needed     INFORMED CONSENT (for any new treatment): Patient was informed of the potential risks and benefits of the treatment, including the option not to treat, and appeared to understand and agreed to comply. Discussion included the following key points: N/A    [name], PhD, LICSW
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
## 2015-12-04 ENCOUNTER — Ambulatory Visit (HOSPITAL_BASED_OUTPATIENT_CLINIC_OR_DEPARTMENT_OTHER): Payer: MEDICARE | Admitting: Social Worker

## 2015-12-04 DIAGNOSIS — F331 Major depressive disorder, recurrent, moderate: Secondary | ICD-10-CM

## 2015-12-04 DIAGNOSIS — F449 Dissociative and conversion disorder, unspecified: Principal | ICD-10-CM

## 2015-12-04 NOTE — Progress Notes (Signed)
Marland KitchenOUTPATIENT PSYCHIATRY PROGRESS NOTE    INTERPRETER: No    CONTACT INFO FOR OTHER AGENCIES AND MENTAL HEALTH PROVIDERS (IF APPLICABLE): Outside trauma-specialist therapist [name], Psy.D, [phone]; group therapist [name]; psychopharm TBD    PROBLEM(S) ADDRESSED IN THIS SESSION:   1. Level of functioning, flashbacks, dissociative experiences and behaviors  2. Psychopharm consultation and medication issues  3. Importance of honesty in tx relationship  4.       SUBJECTIVE  TODAY'S CHIEF COMPLAINT AND CLINICAL UPDATES IN PATIENT'S WORDS:  1) Chief Complaint (Patient and/or guardian's own words, concerns and expressed thoughts): "It was helpful in learning how to get off celexa; I don't feel in need of antianxiety medication at this point... 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"    2) New information from patient and/or collateral (Patient's illness: context, course, modifying factors, severity, cultural, family, social, medical history):   Pt was on time to the appointment  Discussed recent psychopharm consultation; pt feels she has just only realized the effect seasonal change has had on her "I found an old email saying it's as if I get a brain transplant every spring" - also feels growing trust in therapy together with end of winter has ameliorated sx - is trying some natural supplement, specifically curcumin (from turmeric) for anxiety and depression - says tapering off of celexa as per psychopharm provider's instructions appears to be going well, but agrees we should keep an eye on sx and possible deterioration after going off antidepressant  Enjoying new class on countertransference; feels instructor is sensitive to her; not having as much anxiety about presentation next week as she was expecting     OBJECTIVE  DATA REVIEWED (Consider medical labs, radiology, other medical tests; screening/outcome measures; psychological testing; discussion of test results with other clinicians; consultation  with other clinicians and systems involved with patient, summary of old records): Consulted EMR    CURRENT MEDICATIONS (make clear medications prescribed by psychiatry; include OTC medications):    Current Outpatient Prescriptions:  citalopram (CELEXA) 10 MG tablet Take 1 tablet by mouth daily 2 tabs daily. For: depression and anxiety. Disp: 60 tablet Rfl: 0   propranolol (INDERAL) 10 MG tablet Take 1 tablet by mouth 3 (three) times daily as needed For rapid heart rate or anxiety symptoms. Disp: 90 tablet Rfl: 3   THEANINE PO Take  by mouth. Disp:  Rfl:    Multiple Vitamin (MULTIVITAMINS PO)  Disp:  Rfl:      No current facility-administered medications for this visit.     MEDICATION ADHERENCE (including barriers and how addressed): yes     MEDICATION SIDE EFFECTS (Prescribers Only): N/A    BIRTH CONTROL (ask females and males): N/A    CURRENT PREGNANCY: N/A      MENTAL STATUS EXAMINATION                     General Appearance: Groomed white woman wearing weather appropriate clothing, graying medium-length hair     Interaction with Interviewer (eye contact, attitude, behavior): Fair to good eye contact, open - using some humor    Physical Signs    Gait and Station (how patient walks and stands): Within normal limits                  Physical Appearance: Within normal limits          Normal Movements: Within normal limits         Speech (rate, volume, articulation): Within  normal limits          Language: Within normal limits                       Mood: "not as anxious"            Affect: Congruous with content, variable, not tearful     Thought Process     Rate; concrete vs abstract reasoning: Within normal limits        Logical vs illogical; associations: loose, tangential, circumstantial, intact: Pt notes she is/can be mildly tangential at times                                    Thought Content    Normal Thought Content (other than safety): Within normal limits     Perceptions (auditory, visual, tactile, etc.):  Within normal limits       Impulse Control: Within normal limits         Cognition (Link to [facility])    Orientation (person, place, time): Within normal limits      Recent and remote memory: Within normal limits           Attention span and concentration: Within normal limits         Fund of knowledge, awareness of current events and vocabulary: Not formally assessed; appear intact      Judgment: Within normal limits          Insight: Moderate to good        Suicidality/Homicidality/Aggression (Victimization or Perpetration): denies/denies/denies     ASSESSMENT   Today's Assessment:  Pt is not at risk of harming herself or others, feels taper off antidepressant is going well this far    Risk Level Assessment  Risk Level Change (if yes, please describe): No    Suicide: low (1)  Violence: low (1)  Addiction: low (1)          DIAGNOSES ASSESSED TODAY (psychiatric diagnoses and medical diagnoses that factor into management of psychiatric treatment): Dissociative disorder NOS; MDD, recurrent, moderate; PTSD by hx    CLINICAL FORMULATION (Make changes as your understanding changes. Should coincide with treatment plan): 69-yo white domiciled woman on disability for dissociative disorder, seeking continuation of psychotherapy after several briefer therapies (six providers in eight years.) Pt identifies herself as suffering from insecure/disorganized attachment based on early childhood events as well as a lack of "self/observing ego" or alexithymia/severe deficits in connecting to her feelings, and is looking for a somewhat relational, transference/countertransference- based therapy where therapist could model connecting emotions and words, and help pt connect her 'adult' self to her affective states. Pt's strengths include intelligence, sense of humor, perseverence.    REVIEWING TODAY'S VISIT  CLINICAL INTERVENTIONS TODAY:   Gathered information  Offered support  Assessed sx and risk, especially suicidality  Discussed above  topics  Discussed therapy ruptures and repairs  Explored methods/interventions that work and don't feel right for pt  Explored and helped pt express emotions that are difficult to access in order to help pt to metabolize and build connections hampered by early trauma  Discussed uses of both intellectual and emotional working through of trauma and attachment rupture  Discussed psychopharmacology consultation and best ways to proceed with pt's [place] medication    PATIENT'S RESPONSE TO INTERVENTIONS: Pt was engaged and responsive    PROGRESS TOWARDS GOALS: moderate    TIME SPENT  IN PSYCHOTHERAPY: 45 min    PLAN  PLAN FOR MANAGING RISK (Consider risk plan for patients at moderate or high risk for suicide/violence/addiction; medication plan; referrals, etc. Must coincide with treatment plan.): N/A    PLAN FOR ONGOING TREATMENT:    1. Continue tx 2x/week   2. Referral to [facility] day tx program if/when appropriate  3. Referral to neurofeedback tx if available at [facility]  4. Consider further group tx as appropriate  5. Referral to psychopharm -> pt scheduled for 4/20  6. Consult with prescriber and PCP as needed     INFORMED CONSENT (for any new treatment): Patient was informed of the potential risks and benefits of the treatment, including the option not to treat, and appeared to understand and agreed to comply. Discussion included the following key points: N/A    [name], PhD, LICSW

## 2015-12-06 ENCOUNTER — Ambulatory Visit (HOSPITAL_BASED_OUTPATIENT_CLINIC_OR_DEPARTMENT_OTHER): Payer: MEDICARE | Admitting: Social Worker

## 2015-12-06 DIAGNOSIS — F449 Dissociative and conversion disorder, unspecified: Secondary | ICD-10-CM

## 2015-12-06 DIAGNOSIS — F331 Major depressive disorder, recurrent, moderate: Secondary | ICD-10-CM

## 2015-12-06 NOTE — Progress Notes (Signed)
..OUTPATIENT PSYCHIATRY PROGRESS NOTE    INTERPRETER: No    CONTACT INFO FOR OTHER AGENCIES AND MENTAL HEALTH PROVIDERS (IF APPLICABLE): psychopharm [name]; outside trauma-specialist therapist [name], Psy.D, [phone]; former group therapist [name];     PROBLEM(S) ADDRESSED IN THIS SESSION:   1. Level of functioning, flashbacks, dissociative experiences and behaviors  2. Use of medication and supplements in tx pt's sx  3. Integrating a) an increasing ability to self-comfort and b) the development of a more psychological mind as tx goals  4.       SUBJECTIVE  TODAY'S CHIEF COMPLAINT AND CLINICAL UPDATES IN PATIENT'S WORDS:  1) Chief Complaint (Patient and/or guardian's own words, concerns and expressed thoughts): "I agree about circling back to something, like crying, if it's not something we can address in a way that works for me right now"    2) New information from patient and/or collateral (Patient's illness: context, course, modifying factors, severity, cultural, family, social, medical history):   Pt was on time to the appointment  Agrees on importance of paying attention to her mood and diss sx following tapering off celexa and use of anti-inflammatory supplement curcumin - says had "lost track" of her thought she might need anxiolytics or "tranquilizers" prescribed to deal with her psychic pain in February/March (around the same time she felt she might need partial hospitalization or even an inpatient stay) - noticing improvement in mood and sx management in the past two months - pt still feeling loneliness acutely; but also expressing more optimism about possibility of tx to create psychophysiological change in her       OBJECTIVE  DATA REVIEWED (Consider medical labs, radiology, other medical tests; screening/outcome measures; psychological testing; discussion of test results with other clinicians; consultation with other clinicians and systems involved with patient,  summary of old records): Consulted EMR    CURRENT MEDICATIONS (make clear medications prescribed by psychiatry; include OTC medications):    Current Outpatient Prescriptions:  citalopram (CELEXA) 10 MG tablet Take 1 tablet by mouth daily 2 tabs daily. For: depression and anxiety. Disp: 60 tablet Rfl: 0   propranolol (INDERAL) 10 MG tablet Take 1 tablet by mouth 3 (three) times daily as needed For rapid heart rate or anxiety symptoms. Disp: 90 tablet Rfl: 3   THEANINE PO Take  by mouth. Disp:  Rfl:    Multiple Vitamin (MULTIVITAMINS PO)  Disp:  Rfl:      No current facility-administered medications for this visit.     MEDICATION ADHERENCE (including barriers and how addressed): yes     MEDICATION SIDE EFFECTS (Prescribers Only): N/A    BIRTH CONTROL (ask females and males): N/A    CURRENT PREGNANCY: N/A      MENTAL STATUS EXAMINATION                     General Appearance: Groomed white woman wearing weather appropriate clothing, graying medium-length hair     Interaction with Interviewer (eye contact, attitude, behavior): Fair to good eye contact, open - using some humor    Physical Signs    Gait and Station (how patient walks and stands): Within normal limits                  Physical Appearance: Within normal limits          Normal Movements: Within normal limits         Speech (rate, volume, articulation): Within normal limits  Language: Within normal limits                       Mood: "sad"            Affect: Congruous with content, variable, briefly/mildly tearful     Thought Process     Rate; concrete vs abstract reasoning: Within normal limits        Logical vs illogical; associations: loose, tangential, circumstantial, intact: Pt notes she is/can be mildly tangential at times                                    Thought Content    Normal Thought Content (other than safety): Within normal limits     Perceptions (auditory, visual, tactile, etc.): Within normal limits       Impulse Control: Within normal  limits         Cognition (Link to [facility])    Orientation (person, place, time): Within normal limits      Recent and remote memory: Within normal limits           Attention span and concentration: Within normal limits         Fund of knowledge, awareness of current events and vocabulary: Not formally assessed; appear intact      Judgment: Within normal limits          Insight: Moderate to good        Suicidality/Homicidality/Aggression (Victimization or Perpetration): denies/denies/denies     ASSESSMENT   Today's Assessment:  Pt is not at risk of harming herself or others, expressing hope/confidence about tx    Risk Level Assessment  Risk Level Change (if yes, please describe): No    Suicide: low (1)  Violence: low (1)  Addiction: low (1)          DIAGNOSES ASSESSED TODAY (psychiatric diagnoses and medical diagnoses that factor into management of psychiatric treatment): Dissociative disorder NOS; MDD, recurrent, moderate; PTSD by hx    CLINICAL FORMULATION (Make changes as your understanding changes. Should coincide with treatment plan): 70-yo white domiciled woman on disability for dissociative disorder, seeking continuation of psychotherapy after several briefer therapies (six providers in eight years.) Pt identifies herself as suffering from insecure/disorganized attachment based on early childhood events as well as a lack of "self/observing ego" or alexithymia/severe deficits in connecting to her feelings, and is looking for a somewhat relational, transference/countertransference- based therapy where therapist could model connecting emotions and words, and help pt connect her 'adult' self to her affective states. Pt's strengths include intelligence, sense of humor, perseverence.    REVIEWING TODAY'S VISIT  CLINICAL INTERVENTIONS TODAY:   Gathered information  Offered support  Assessed sx and risk, especially suicidality  Discussed above topics  Discussed therapy ruptures and repairs  Explored methods/interventions  that work and don't feel right for pt  Explored and helped pt express emotions that are difficult to access in order to help pt to metabolize and build connections hampered by early trauma  Discussed uses of both intellectual and emotional working through of trauma and attachment rupture  Discussed pt's use of curcumin supplement and its possible effect on pt's mood    PATIENT'S RESPONSE TO INTERVENTIONS: Pt was engaged and responsive    PROGRESS TOWARDS GOALS: moderate    TIME SPENT IN PSYCHOTHERAPY: 45 min    PLAN  PLAN FOR MANAGING RISK (Consider risk plan for  patients at moderate or high risk for suicide/violence/addiction; medication plan; referrals, etc. Must coincide with treatment plan.): N/A    PLAN FOR ONGOING TREATMENT:    1. Continue tx 2x/week to stabilize pt's improvement  2. Referral to [facility] day tx program if sx increase/if appropriate  3. Referral to neurofeedback tx if available at [facility] if/when appropriate  4. Consider further group tx as appropriate  5. Encourage pt to re-engage in psychopharm consultation as needed  6. Consult with prescriber and PCP as needed     INFORMED CONSENT (for any new treatment): Patient was informed of the potential risks and benefits of the treatment, including the option not to treat, and appeared to understand and agreed to comply. Discussion included the following key points: N/A    [name], PhD, LICSW

## 2015-12-11 ENCOUNTER — Ambulatory Visit (HOSPITAL_BASED_OUTPATIENT_CLINIC_OR_DEPARTMENT_OTHER): Payer: MEDICARE | Admitting: Social Worker

## 2015-12-11 DIAGNOSIS — F331 Major depressive disorder, recurrent, moderate: Secondary | ICD-10-CM

## 2015-12-11 DIAGNOSIS — F449 Dissociative and conversion disorder, unspecified: Secondary | ICD-10-CM

## 2015-12-11 NOTE — Progress Notes (Signed)
Brianna KitchenOUTPATIENT PSYCHIATRY PROGRESS NOTE    INTERPRETER: No    CONTACT INFO FOR OTHER AGENCIES AND MENTAL HEALTH PROVIDERS (IF APPLICABLE): [name]; outside trauma-specialist therapist [name], Psy.D, [phone]; former group therapist [name]     PROBLEM(S) ADDRESSED IN THIS SESSION:   1. Level of functioning, flashbacks, dissociative experiences and behaviors  2. Distrust of "feeling better"  3.   4.       SUBJECTIVE  TODAY'S CHIEF COMPLAINT AND CLINICAL UPDATES IN PATIENT'S WORDS:  1) Chief Complaint (Patient and/or guardian's own words, concerns and expressed thoughts): "It was about twenty years ago when I lost my job and my mother died; I had ended my relationship and 'fallen into the abyss' of dissociation... I don't think I ever really did get through that"    2) New information from patient and/or collateral (Patient's illness: context, course, modifying factors, severity, cultural, family, social, medical history):   Pt was on time to the appointment  Experiencing feeling better after a successful presentation and a good response at class pt is taking - but some wariness about a pattern pt has experienced before of "compensation and collapse" - we discuss ways in which being aware of a pattern might help dissolve its power, and they way the possible suspicion of feeling better might play a role  Pt giving more detail about her past experiences, especially towards the end of her work career  Specific concern about getting back in touch with pt's most important friend since [place] (Consider medical labs, radiology, other medical tests; screening/outcome measures; psychological testing; discussion of test results with other clinicians; consultation with other clinicians and systems involved with patient, summary of old records): Consulted EMR    CURRENT MEDICATIONS (make clear medications prescribed by psychiatry; include OTC  medications):    Current Outpatient Prescriptions:  citalopram (CELEXA) 10 MG tablet Take 1 tablet by mouth daily 2 tabs daily. For: depression and anxiety. Disp: 60 tablet Rfl: 0   propranolol (INDERAL) 10 MG tablet Take 1 tablet by mouth 3 (three) times daily as needed For rapid heart rate or anxiety symptoms. Disp: 90 tablet Rfl: 3   THEANINE PO Take  by mouth. Disp:  Rfl:    Multiple Vitamin (MULTIVITAMINS PO)  Disp:  Rfl:      No current facility-administered medications for this visit.     MEDICATION ADHERENCE (including barriers and how addressed): yes     MEDICATION SIDE EFFECTS (Prescribers Only): N/A    BIRTH CONTROL (ask females and males): N/A    CURRENT PREGNANCY: N/A      MENTAL STATUS EXAMINATION                     General Appearance: Groomed white woman wearing weather appropriate clothing, graying medium-length hair     Interaction with Interviewer (eye contact, attitude, behavior): Fair to good eye contact, open - using some humor    Physical Signs    Gait and Station (how patient walks and stands): Within normal limits                  Physical Appearance: Within normal limits          Normal Movements: Within normal limits         Speech (rate, volume, articulation): Within normal limits          Language: Within normal limits  Mood: "tearful"            Affect: Congruous with content, variable, tearful when discussing being moved by music and memories     Thought Process     Rate; concrete vs abstract reasoning: Within normal limits        Logical vs illogical; associations: loose, tangential, circumstantial, intact: Pt notes she is/can be mildly tangential at times                                    Thought Content    Normal Thought Content (other than safety): Within normal limits     Perceptions (auditory, visual, tactile, etc.): Within normal limits       Impulse Control: Within normal limits         Cognition (Link to [facility])    Orientation (person, place, time): Within  normal limits      Recent and remote memory: Within normal limits           Attention span and concentration: Within normal limits         Fund of knowledge, awareness of current events and vocabulary: Not formally assessed; appear intact      Judgment: Within normal limits          Insight: Moderate to good        Suicidality/Homicidality/Aggression (Victimization or Perpetration): denies/denies/denies     ASSESSMENT   Today's Assessment:  Pt is not at risk of harming herself or others, reporting some improvement in mood    Risk Level Assessment  Risk Level Change (if yes, please describe): No    Suicide: low (1)  Violence: low (1)  Addiction: low (1)          DIAGNOSES ASSESSED TODAY (psychiatric diagnoses and medical diagnoses that factor into management of psychiatric treatment): Dissociative disorder NOS; MDD, recurrent, moderate; PTSD by hx    CLINICAL FORMULATION (Make changes as your understanding changes. 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 Pt's strengths include intelligence, sense of humor, perseverence.    REVIEWING TODAY'S VISIT  CLINICAL INTERVENTIONS TODAY:   Gathered information  Offered support  Assessed sx and risk, especially suicidality  Discussed above topics  Discussed therapy ruptures and repairs  Explored methods/interventions that work and don't feel right for pt  Explored and helped pt express emotions that  are difficult to access in order to help pt to metabolize and build connections hampered by early trauma  Discussed uses of both intellectual and emotional working through of trauma and attachment rupture    PATIENT'S RESPONSE TO INTERVENTIONS: Pt was engaged and responsive    PROGRESS TOWARDS GOALS: good    TIME SPENT IN PSYCHOTHERAPY: 45 min    [place] (Consider risk plan for patients at moderate or high risk for suicide/violence/addiction; medication plan; referrals, etc. Must coincide with treatment plan.): N/A    PLAN FOR ONGOING TREATMENT:    1. Continue tx 2x/week  to stabilize pt's improvement  2. Referral to [facility] day tx program if sx increase/if appropriate  3. Referral to neurofeedback tx if available at [facility] if/when appropriate  4. Consider further group tx as appropriate  5. Encourage pt to re-engage in psychopharm consultation as needed  6. Consult with prescriber and PCP as needed     INFORMED CONSENT (for any new treatment): Patient was informed of the potential risks and benefits of the treatment, including the option not to treat, and appeared to understand and agreed to comply. Discussion included the following key points: N/A    [name], PhD, LICSW

## 2015-12-13 ENCOUNTER — Ambulatory Visit (HOSPITAL_BASED_OUTPATIENT_CLINIC_OR_DEPARTMENT_OTHER): Payer: MEDICARE | Admitting: Social Worker

## 2015-12-13 DIAGNOSIS — F449 Dissociative and conversion disorder, unspecified: Principal | ICD-10-CM

## 2015-12-13 DIAGNOSIS — F331 Major depressive disorder, recurrent, moderate: Secondary | ICD-10-CM

## 2015-12-13 NOTE — Progress Notes (Signed)
Marland KitchenOUTPATIENT PSYCHIATRY PROGRESS NOTE    INTERPRETER: No    CONTACT INFO FOR OTHER AGENCIES AND MENTAL HEALTH PROVIDERS (IF APPLICABLE): [name]; outside trauma-specialist therapist [name], Psy.D, [phone]; former group therapist [name]     PROBLEM(S) ADDRESSED IN THIS SESSION:   1. Level of functioning, flashbacks, dissociative experiences and behaviors  2. relational  3.   4.       SUBJECTIVE  TODAY'S CHIEF COMPLAINT AND CLINICAL UPDATES IN PATIENT'S WORDS:  1) Chief Complaint (Patient and/or guardian's own words, concerns and expressed thoughts): "I want to manage my possible anger at my friend"    2) New information from patient and/or collateral (Patient's illness: context, course, modifying factors, severity, cultural, family, social, medical history):   Pt was on time to the appointment  Focus today on pt's relationship w/her oldest friend (since 66); specifically pattern of feeling friend can be unreliable, and upcoming planned phone call with her after not being in touch much for the past five years  Also discuss possible usefulness and fit of biofeedback and meditation workshop at [facility]; pt decides to email the instructor and ask about suitability for ppl with dissociation  Also discuss pt's experience of usefulness of beta-blockers    OBJECTIVE  DATA REVIEWED (Consider medical labs, radiology, other medical tests; screening/outcome measures; psychological testing; discussion of test results with other clinicians; consultation with other clinicians and systems involved with patient, summary of old records): [place] (make clear medications prescribed by psychiatry; include OTC medications):    Current Outpatient Prescriptions:  citalopram (CELEXA) 10 MG tablet Take 1 tablet by mouth daily 2 tabs daily. For: depression and anxiety. Disp: 60 tablet Rfl: 0   propranolol (INDERAL) 10 MG tablet Take 1 tablet by mouth 3 (three) times daily  as needed For rapid heart rate or anxiety symptoms. Disp: 90 tablet Rfl: 3   THEANINE PO Take  by mouth. Disp:  Rfl:    Multiple Vitamin (MULTIVITAMINS PO)  Disp:  Rfl:      No current facility-administered medications for this visit.     MEDICATION ADHERENCE (including barriers and how addressed): yes     MEDICATION SIDE EFFECTS (Prescribers Only): N/A    BIRTH CONTROL (ask females and males): N/A    CURRENT PREGNANCY: N/A      MENTAL STATUS EXAMINATION                     General Appearance: Groomed white woman wearing weather appropriate clothing, graying medium-length hair     Interaction with Interviewer (eye contact, attitude, behavior): Fair to good eye contact, open - using some humor    Physical Signs    Gait and Station (how patient walks and stands): Within normal limits                  Physical Appearance: Within normal limits          Normal Movements: Within normal limits         Speech (rate, volume, articulation): Within normal limits          Language: Within normal limits                       Mood: "again tearful"            Affect: Congruous with content, variable, mildly tearful at times    Thought Process     Rate; concrete vs  abstract reasoning: Within normal limits        Logical vs illogical; associations: loose, tangential, circumstantial, intact: Pt notes she is/can be mildly tangential at times                                    Thought Content    Normal Thought Content (other than safety): Within normal limits     Perceptions (auditory, visual, tactile, etc.): Within normal limits       Impulse Control: Within normal limits         Cognition (Link to [facility])    Orientation (person, place, time): Within normal limits      Recent and remote memory: Within normal limits           Attention span and concentration: Within normal limits         Fund of knowledge, awareness of current events and vocabulary: Not formally assessed; appear intact      Judgment: Within normal  limits          Insight: Moderate to good        Suicidality/Homicidality/Aggression (Victimization or Perpetration): denies/denies/denies     ASSESSMENT   Today's Assessment:  Pt is not at risk of harming herself or others, showing some optimist about possibility of getting better    Risk Level Assessment  Risk Level Change (if yes, please describe): No    Suicide: low (1)  Violence: low (1)  Addiction: low (1)          DIAGNOSES ASSESSED TODAY (psychiatric diagnoses and medical diagnoses that factor into management of psychiatric treatment): Dissociative disorder NOS; MDD, recurrent, moderate; PTSD by hx    CLINICAL FORMULATION (Make changes as your understanding changes. 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 Pt's strengths include intelligence, sense of humor, perseverence.    REVIEWING TODAY'S VISIT  CLINICAL INTERVENTIONS TODAY:   Gathered information  Offered support  Assessed sx and risk, especially suicidality  Discussed above topics  Discussed therapy ruptures and repairs  Explored methods/interventions that work and don't feel right for pt  Explored and helped pt express emotions that are difficult to access in order to help pt to metabolize and build connections hampered by early trauma  Discussed uses of both intellectual and emotional working through of trauma and attachment rupture      PATIENT'S RESPONSE TO INTERVENTIONS:  Pt was engaged and responsive    PROGRESS TOWARDS GOALS: moderate    TIME SPENT IN PSYCHOTHERAPY: 45 min    PLAN  PLAN FOR MANAGING RISK (Consider risk plan for patients at moderate or high risk for suicide/violence/addiction; medication plan; referrals, etc. Must coincide with treatment plan.): N/A    PLAN FOR ONGOING TREATMENT:    1. Continue tx 2x/week to stabilize pt's improvement  2. Referral to [facility] day tx program if sx increase/if appropriate  3. Referral to neurofeedback tx if available at [facility] if/when appropriate  4. Consider further group tx or  auxiliary tx as appropriate  5. Encourage pt to re-engage in psychopharm consultation as needed  6. Consult with prescriber and PCP as needed     INFORMED CONSENT (for any new treatment): Patient was informed of the potential risks and benefits of the treatment, including the option not to treat, and appeared to understand and agreed to comply. Discussion included the following key points: N/A    [name], PhD, LICSW

## 2015-12-18 ENCOUNTER — Ambulatory Visit (HOSPITAL_BASED_OUTPATIENT_CLINIC_OR_DEPARTMENT_OTHER): Payer: MEDICARE | Admitting: Social Worker

## 2015-12-18 DIAGNOSIS — F331 Major depressive disorder, recurrent, moderate: Secondary | ICD-10-CM

## 2015-12-18 DIAGNOSIS — F449 Dissociative and conversion disorder, unspecified: Principal | ICD-10-CM

## 2015-12-18 NOTE — Progress Notes (Signed)
..OUTPATIENT PSYCHIATRY PROGRESS NOTE    INTERPRETER: No    CONTACT INFO FOR OTHER AGENCIES AND MENTAL HEALTH PROVIDERS (IF APPLICABLE): [name]; outside trauma-specialist therapist [name], Psy.D, [phone]; former group therapist [name]     PROBLEM(S) ADDRESSED IN THIS SESSION:   1. Level of functioning, flashbacks, dissociative experiences and behaviors  2. Relational (friendship)  3. Possible attending of a meditation and biofeedback workshop  4.       SUBJECTIVE  TODAY'S CHIEF COMPLAINT AND CLINICAL UPDATES IN PATIENT'S WORDS:  1) Chief Complaint (Patient and/or guardian's own words, concerns and expressed thoughts): "I think I'm not good at I-statements because I have difficulty knowing what I feel"    2) New information from patient and/or collateral (Patient's illness: context, course, modifying factors, severity, cultural, family, social, medical history):   Pt was on time to the appointment  Discuss pt's application to biofeedback workshop and writing strategies  Call w/pt's oldest friend with whom she hasn't been much in touch in the past few years still hasn't happened; we discuss pt's experiences with friend's relative unreliability and what ways there might be of handling/working through/expressing that  Positive experience of support at class pt is currently taking  A noticeable change in pt's activity level: "a few months ago I was barely leaving the house, now I'm attending something almost every night of the week for the past week and this week"      OBJECTIVE  DATA REVIEWED (Consider medical labs, radiology, other medical tests; screening/outcome measures; psychological testing; discussion of test results with other clinicians; consultation with other clinicians and systems involved with patient, summary of old records): Consulted EMR    CURRENT MEDICATIONS (make clear medications prescribed by psychiatry; include OTC medications):    Current Outpatient  Prescriptions:  citalopram (CELEXA) 10 MG tablet Take 1 tablet by mouth daily 2 tabs daily. For: depression and anxiety. Disp: 60 tablet Rfl: 0   propranolol (INDERAL) 10 MG tablet Take 1 tablet by mouth 3 (three) times daily as needed For rapid heart rate or anxiety symptoms. Disp: 90 tablet Rfl: 3   THEANINE PO Take  by mouth. Disp:  Rfl:    Multiple Vitamin (MULTIVITAMINS PO)  Disp:  Rfl:      No current facility-administered medications for this visit.     MEDICATION ADHERENCE (including barriers and how addressed): yes     MEDICATION SIDE EFFECTS (Prescribers Only): N/A    BIRTH CONTROL (ask females and males): N/A    CURRENT PREGNANCY: N/A      MENTAL STATUS EXAMINATION                     General Appearance: Groomed white woman wearing weather appropriate clothing, graying medium-length hair     Interaction with Interviewer (eye contact, attitude, behavior): Fair to good eye contact, open - using some humor    Physical Signs    Gait and Station (how patient walks and stands): Within normal limits                  Physical Appearance: Within normal limits          Normal Movements: Within normal limits         Speech (rate, volume, articulation): Within normal limits          Language: Within normal limits  Mood: "not close to tears"            Affect: Congruous with content, appropriately variable    Thought Process     Rate; concrete vs abstract reasoning: Within normal limits        Logical vs illogical; associations: loose, tangential, circumstantial, intact: Pt notes she is/can be mildly tangential at times                                    Thought Content    Normal Thought Content (other than safety): Within normal limits     Perceptions (auditory, visual, tactile, etc.): Within normal limits       Impulse Control: Within normal limits         Cognition (Link to [facility])    Orientation (person, place, time): Within normal limits      Recent and remote memory: Within normal limits            Attention span and concentration: Within normal limits         Fund of knowledge, awareness of current events and vocabulary: Not formally assessed; appear intact      Judgment: Within normal limits          Insight: Moderate to good        Suicidality/Homicidality/Aggression (Victimization or Perpetration): denies/denies/denies     ASSESSMENT   Today's Assessment:  Pt is not at risk of harming herself or others, increased meaningful activities and connections    Risk Level Assessment  Risk Level Change (if yes, please describe): No    Suicide: low (1)  Violence: low (1)  Addiction: low (1)          DIAGNOSES ASSESSED TODAY (psychiatric diagnoses and medical diagnoses that factor into management of psychiatric treatment): Dissociative disorder NOS; MDD, recurrent, moderate; PTSD by hx    CLINICAL FORMULATION (Make changes as your understanding changes. 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 Pt's strengths include intelligence, sense of humor, perseverence.    REVIEWING TODAY'S VISIT  CLINICAL INTERVENTIONS TODAY:   Gathered information  Offered support  Assessed sx and risk, especially suicidality  Discussed above topics  Discussed therapy ruptures and repairs  Explored methods/interventions that work and don't feel right for pt  Explored and helped pt express emotions that are difficult to access in order to help pt to  metabolize and build connections hampered by early trauma  Discussed uses of both intellectual and emotional working through of trauma and attachment rupture      PATIENT'S RESPONSE TO INTERVENTIONS: Pt was engaged and responsive    PROGRESS TOWARDS GOALS: good    TIME SPENT IN PSYCHOTHERAPY: 45 min    [place] (Consider risk plan for patients at moderate or high risk for suicide/violence/addiction; medication plan; referrals, etc. Must coincide with treatment plan.): N/A    PLAN FOR ONGOING TREATMENT:    1. Continue tx 2x/week to stabilize pt's improvement  2. Referral to [facility] day tx program if sx increase/if appropriate  3. Referral to neurofeedback tx if available at [facility] if/when appropriate  4. Consider further group tx or auxiliary tx as appropriate  5. Encourage pt to re-engage in psychopharm consultation as needed  6. Consult with prescriber and PCP as needed     INFORMED CONSENT (for any new treatment): Patient was informed of the potential risks and benefits of the treatment, including the option not to treat, and appeared to understand and agreed to comply. Discussion included the following key points: N/A    [name], PhD, LICSW

## 2015-12-20 ENCOUNTER — Ambulatory Visit (HOSPITAL_BASED_OUTPATIENT_CLINIC_OR_DEPARTMENT_OTHER): Payer: MEDICARE | Admitting: Social Worker

## 2015-12-20 DIAGNOSIS — F449 Dissociative and conversion disorder, unspecified: Principal | ICD-10-CM

## 2015-12-20 DIAGNOSIS — F331 Major depressive disorder, recurrent, moderate: Secondary | ICD-10-CM

## 2015-12-20 NOTE — Progress Notes (Signed)
Brianna KitchenOUTPATIENT PSYCHIATRY PROGRESS NOTE    INTERPRETER: No    CONTACT INFO FOR OTHER AGENCIES AND MENTAL HEALTH PROVIDERS (IF APPLICABLE): [name]; outside trauma-specialist therapist [name], Psy.D, [phone]; former group therapist [name]     PROBLEM(S) ADDRESSED IN THIS SESSION:   1. Level of functioning, flashbacks, dissociative experiences and behaviors  2. 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 Planning for summer/provider's absence  4.       SUBJECTIVE  TODAY'S CHIEF COMPLAINT AND CLINICAL UPDATES IN PATIENT'S WORDS:  1) Chief Complaint (Patient and/or guardian's own words, concerns and expressed thoughts): "I'd like to plan my being away this summer for when you're away"    2) New information from patient and/or collateral (Patient's illness: context, course, modifying factors, severity, cultural, family, social, medical history):   Pt was on time to the appointment  Continue to discuss ways in which pt feels some optimism about strengthening connections between thinking and feeling / lessening threatening experiences of dissociated feeling  Possible plans of attending a workshop by expert in trauma and the body this summer  Discuss pt's preferences for when t/provider will be away this summer and possibility of her working with trauma therapist Cristal Ford during the summer, which she has often done during past summers      [place] (Consider medical labs, radiology, other medical tests; screening/outcome measures; psychological testing; discussion of test results with other clinicians; consultation with other clinicians and systems involved with patient, summary of old records): Consulted EMR    CURRENT MEDICATIONS (make clear medications prescribed by psychiatry; include OTC medications):    Current Outpatient Prescriptions:  citalopram (CELEXA) 10 MG tablet Take 1 tablet by mouth daily 2 tabs daily. For:  depression and anxiety. Disp: 60 tablet Rfl: 0   propranolol (INDERAL) 10 MG tablet Take 1 tablet by mouth 3 (three) times daily as needed For rapid heart rate or anxiety symptoms. Disp: 90 tablet Rfl: 3   THEANINE PO Take  by mouth. Disp:  Rfl:    Multiple Vitamin (MULTIVITAMINS PO)  Disp:  Rfl:      No current facility-administered medications for this visit.     MEDICATION ADHERENCE (including barriers and how addressed): yes     MEDICATION SIDE EFFECTS (Prescribers Only): N/A    BIRTH CONTROL (ask females and males): N/A    CURRENT PREGNANCY: N/A      MENTAL STATUS EXAMINATION                     General Appearance: Groomed white woman wearing weather appropriate clothing, graying medium-length hair     Interaction with Interviewer (eye contact, attitude, behavior): Fair to good eye contact, open - using some humor    Physical Signs    Gait and Station (how patient walks and stands): Within normal limits                  Physical Appearance: Within normal limits          Normal Movements: Within normal limits         Speech (rate, volume, articulation): Within normal limits          Language: Within normal limits                       Mood: "close to tears"            Affect: Congruous with content, appropriately variable  Thought Process     Rate; concrete vs abstract reasoning: Within normal limits        Logical vs illogical; associations: loose, tangential, circumstantial, intact: Pt notes she is/can be mildly tangential at times                                    Thought Content    Normal Thought Content (other than safety): Within normal limits     Perceptions (auditory, visual, tactile, etc.): Within normal limits       Impulse Control: Within normal limits         Cognition (Link to [facility])    Orientation (person, place, time): Within normal limits      Recent and remote memory: Within normal limits           Attention span and concentration: Within normal limits         Fund of knowledge, awareness of  current events and vocabulary: Not formally assessed; appear intact      Judgment: Within normal limits          Insight: Moderate to good        Suicidality/Homicidality/Aggression (Victimization or Perpetration): denies/denies/denies     ASSESSMENT   Today's Assessment:  Pt is not at risk of harming herself or others, continues to report future orientation and planning for meaningful activities and connections    Risk Level Assessment  Risk Level Change (if yes, please describe): No    Suicide: low (1)  Violence: low (1)  Addiction: low (1)          DIAGNOSES ASSESSED TODAY (psychiatric diagnoses and medical diagnoses that factor into management of psychiatric treatment): Dissociative disorder NOS; MDD, recurrent, moderate; PTSD by hx    CLINICAL FORMULATION (Make changes as your understanding changes. 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 Pt's strengths include intelligence, sense of humor, perseverence.    REVIEWING TODAY'S VISIT  CLINICAL INTERVENTIONS TODAY:   Gathered information  Offered support  Assessed sx and risk, especially suicidality  Discussed above topics  Discussed therapy ruptures and repairs  Explored methods/interventions that work and don't feel right for pt  Explored and helped pt express emotions that are difficult to access in order to help pt to metabolize and build  connections hampered by early trauma  Discussed uses of both intellectual and emotional working through of trauma and attachment rupture  Discussed summer schedule and possible parallel tx with trauma therapist during the summer    PATIENT'S RESPONSE TO INTERVENTIONS: Pt was engaged and responsive    PROGRESS TOWARDS GOALS: good    TIME SPENT IN PSYCHOTHERAPY: 45 min    PLAN  PLAN FOR MANAGING RISK (Consider risk plan for patients at moderate or high risk for suicide/violence/addiction; medication plan; referrals, etc. Must coincide with treatment plan.): N/A    PLAN FOR ONGOING TREATMENT:    1. Continue tx 2x/week to stabilize pt's improvement  2. Referral to [facility] day tx program if sx increase/if appropriate  3. Referral to neurofeedback tx if available at [facility] if/when appropriate  4. Consider further group tx or auxiliary tx as appropriate  5. Encourage pt to re-engage in psychopharm consultation as needed  6. Consult with prescriber and PCP as needed     INFORMED CONSENT (for any new treatment): Patient was informed of the potential risks and benefits of the treatment, including the option not to treat, and appeared to understand and agreed to comply. Discussion included the following key points: N/A    [name], PhD, LICSW

## 2015-12-25 ENCOUNTER — Ambulatory Visit (HOSPITAL_BASED_OUTPATIENT_CLINIC_OR_DEPARTMENT_OTHER): Payer: MEDICARE | Admitting: Social Worker

## 2015-12-25 DIAGNOSIS — F449 Dissociative and conversion disorder, unspecified: Secondary | ICD-10-CM

## 2015-12-25 DIAGNOSIS — F331 Major depressive disorder, recurrent, moderate: Secondary | ICD-10-CM

## 2015-12-25 NOTE — Progress Notes (Signed)
Marland KitchenOUTPATIENT PSYCHIATRY PROGRESS NOTE    INTERPRETER: No    CONTACT INFO FOR OTHER AGENCIES AND MENTAL HEALTH PROVIDERS (IF APPLICABLE): [name]; outside trauma-specialist therapist [name], Psy.D, [phone]; former group therapist [name]     PROBLEM(S) ADDRESSED IN THIS SESSION:   1. Level of functioning, flashbacks, dissociative experiences and behaviors  2. 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 Strategies for dealing with anger and expressing it  4.       SUBJECTIVE  TODAY'S CHIEF COMPLAINT AND CLINICAL UPDATES IN PATIENT'S WORDS:  1) Chief Complaint (Patient and/or guardian's own words, concerns and expressed thoughts): "I'd like to get your feedback on this email I wrote"    2) New information from patient and/or collateral (Patient's illness: context, course, modifying factors, severity, cultural, family, social, medical history):   Pt was on time to the appointment  Has bought some summer clothing for herself, notices difficulty in reporting pleasurable activity  Focus of session on assertiveness / anger and ways in which pt feels blocked / thwarted / retraumatized by experiences where her anger has been judged and forbidden  Specifically, discussing pt's feeling of some progress in moving anger from dissociated / young state to more expressive language and the developmental steps that she might need to be able to express herself "maturely" and as calmly as possible  Leaves a MH-themed poster of a previous presentation in the office to be looked at in next session    OBJECTIVE  DATA REVIEWED (Consider medical labs, radiology, other medical tests; screening/outcome measures; psychological testing; discussion of test results with other clinicians; consultation with other clinicians and systems involved with patient, summary of old records): [place] (make clear medications prescribed by psychiatry;  include OTC medications):    Current Outpatient Prescriptions:  citalopram (CELEXA) 10 MG tablet Take 1 tablet by mouth daily 2 tabs daily. For: depression and anxiety. Disp: 60 tablet Rfl: 0   propranolol (INDERAL) 10 MG tablet Take 1 tablet by mouth 3 (three) times daily as needed For rapid heart rate or anxiety symptoms. Disp: 90 tablet Rfl: 3   THEANINE PO Take  by mouth. Disp:  Rfl:    Multiple Vitamin (MULTIVITAMINS PO)  Disp:  Rfl:      No current facility-administered medications for this visit.     MEDICATION ADHERENCE (including barriers and how addressed): yes     MEDICATION SIDE EFFECTS (Prescribers Only): N/A    BIRTH CONTROL (ask females and males): N/A    CURRENT PREGNANCY: N/A      MENTAL STATUS EXAMINATION                     General Appearance: Groomed white woman wearing weather appropriate clothing, graying medium-length hair     Interaction with Interviewer (eye contact, attitude, behavior): Fair to good eye contact, open - using some humor    Physical Signs    Gait and Station (how patient walks and stands): Within normal limits                  Physical Appearance: Within normal limits          Normal Movements: Within normal limits         Speech (rate, volume, articulation): Within normal limits          Language: Within normal limits  Mood: "questioning"            Affect: Congruous with content, appropriately variable    Thought Process     Rate; concrete vs abstract reasoning: Within normal limits        Logical vs illogical; associations: loose, tangential, circumstantial, intact: Pt notes she is/can be mildly tangential at times                                    Thought Content    Normal Thought Content (other than safety): Within normal limits     Perceptions (auditory, visual, tactile, etc.): Within normal limits       Impulse Control: Within normal limits         Cognition (Link to [facility])    Orientation (person, place, time): Within normal limits      Recent and  remote memory: Within normal limits           Attention span and concentration: Within normal limits         Fund of knowledge, awareness of current events and vocabulary: Not formally assessed; appear intact      Judgment: Within normal limits          Insight: Moderate to good        Suicidality/Homicidality/Aggression (Victimization or Perpetration): denies/denies/denies     ASSESSMENT   Today's Assessment:  Pt is not at risk of harming herself or others, continues to report future orientation and planning for meaningful activities and connections    Risk Level Assessment  Risk Level Change (if yes, please describe): No    Suicide: low (1)  Violence: low (1)  Addiction: low (1)          DIAGNOSES ASSESSED TODAY (psychiatric diagnoses and medical diagnoses that factor into management of psychiatric treatment): Dissociative disorder NOS; MDD, recurrent, moderate; PTSD by hx    CLINICAL FORMULATION (Make changes as your understanding changes. 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 Pt's strengths include intelligence, sense of humor, perseverence.    REVIEWING TODAY'S VISIT  CLINICAL INTERVENTIONS TODAY:   Gathered information  Offered support  Assessed sx and risk, especially suicidality  Discussed above topics  Discussed therapy ruptures and repairs  Explored methods/interventions that work  and don't feel right for pt  Explored and helped pt express emotions that are difficult to access in order to help pt to metabolize and build connections hampered by early trauma  Discussed uses of both intellectual and emotional working through of trauma and attachment rupture  Validated progress pt has made    PATIENT'S RESPONSE TO INTERVENTIONS: Pt was engaged and responsive    PROGRESS TOWARDS GOALS: good    TIME SPENT IN PSYCHOTHERAPY: 45 min    [place] (Consider risk plan for patients at moderate or high risk for suicide/violence/addiction; medication plan; referrals, etc. Must coincide with treatment plan.): N/A    PLAN FOR  ONGOING TREATMENT:    1. Continue tx 2x/week to stabilize pt's improvement  2. Referral to [facility] day tx program if sx increase/if appropriate  3. Referral to neurofeedback tx if available at [facility] if/when appropriate  4. Consider further group tx or auxiliary tx as appropriate  5. Encourage pt to re-engage in psychopharm consultation as needed  6. Consult with prescriber and PCP as needed     INFORMED CONSENT (for any new treatment): Patient was informed of the potential risks and benefits of the treatment, including the option not to treat, and appeared to understand and agreed to comply. Discussion included the following key points: N/A    [name], PhD, LICSW

## 2015-12-27 ENCOUNTER — Ambulatory Visit (HOSPITAL_BASED_OUTPATIENT_CLINIC_OR_DEPARTMENT_OTHER): Payer: MEDICARE | Admitting: Social Worker

## 2015-12-27 DIAGNOSIS — F449 Dissociative and conversion disorder, unspecified: Secondary | ICD-10-CM

## 2015-12-27 DIAGNOSIS — F331 Major depressive disorder, recurrent, moderate: Secondary | ICD-10-CM

## 2015-12-27 NOTE — Progress Notes (Signed)
Marland KitchenOUTPATIENT PSYCHIATRY PROGRESS NOTE    INTERPRETER: No    CONTACT INFO FOR OTHER AGENCIES AND MENTAL HEALTH PROVIDERS (IF APPLICABLE): [name]; outside trauma-specialist therapist [name], Psy.D, [phone]; former group therapist [name]     PROBLEM(S) ADDRESSED IN THIS SESSION:   1. Level of functioning, flashbacks, dissociative experiences and behaviors  2. 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 Upcoming death anniversary of father  62.       SUBJECTIVE  TODAY'S CHIEF COMPLAINT AND CLINICAL UPDATES IN PATIENT'S WORDS:  1) Chief Complaint (Patient and/or guardian's own words, concerns and expressed thoughts): "My father died Memorial Day weekend 38... This time of the year has been tricky for me"    2) New information from patient and/or collateral (Patient's illness: context, course, modifying factors, severity, cultural, family, social, medical history):   Pt was on time to the appointment  Focus of session on a) father's suicide threats in pt's childhood and pt's response to them b) possibility of an upcoming anniversary reaction around time of father's death 31 years ago c) pt's need to have a response that combines a comforting tone with compassionate words/"a label" for what pt might be experiencing (this in contrast to mother's ability to respond to pt's possible distress)      OBJECTIVE  DATA REVIEWED (Consider medical labs, radiology, other medical tests; screening/outcome measures; psychological testing; discussion of test results with other clinicians; consultation with other clinicians and systems involved with patient, summary of old records): Consulted EMR    CURRENT MEDICATIONS (make clear medications prescribed by psychiatry; include OTC medications):    Current Outpatient Prescriptions:  citalopram (CELEXA) 10 MG tablet Take 1 tablet by mouth daily 2 tabs daily. For: depression and anxiety. Disp: 60 tablet Rfl: 0    propranolol (INDERAL) 10 MG tablet Take 1 tablet by mouth 3 (three) times daily as needed For rapid heart rate or anxiety symptoms. Disp: 90 tablet Rfl: 3   THEANINE PO Take  by mouth. Disp:  Rfl:    Multiple Vitamin (MULTIVITAMINS PO)  Disp:  Rfl:      No current facility-administered medications for this visit.     MEDICATION ADHERENCE (including barriers and how addressed): yes     MEDICATION SIDE EFFECTS (Prescribers Only): N/A    BIRTH CONTROL (ask females and males): N/A    CURRENT PREGNANCY: N/A      MENTAL STATUS EXAMINATION                     General Appearance: Groomed white woman wearing weather appropriate clothing, graying medium-length hair     Interaction with Interviewer (eye contact, attitude, behavior): Fair to good eye contact, open - using some humor    Physical Signs    Gait and Station (how patient walks and stands): Within normal limits                  Physical Appearance: Within normal limits          Normal Movements: Within normal limits         Speech (rate, volume, articulation): Within normal limits          Language: Within normal limits                       Mood: "confused"            Affect: Congruous with content, appropriately variable    Thought Process  Rate; concrete vs abstract reasoning: Within normal limits        Logical vs illogical; associations: loose, tangential, circumstantial, intact: Pt notes she is/can be mildly tangential at times                                    Thought Content    Normal Thought Content (other than safety): Within normal limits     Perceptions (auditory, visual, tactile, etc.): Within normal limits       Impulse Control: Within normal limits         Cognition (Link to [facility])    Orientation (person, place, time): Within normal limits      Recent and remote memory: Within normal limits           Attention span and concentration: Within normal limits         Fund of knowledge, awareness of current events and vocabulary: Not formally assessed;  appear intact      Judgment: Within normal limits          Insight: Moderate to good        Suicidality/Homicidality/Aggression (Victimization or Perpetration): denies/denies/denies     ASSESSMENT   Today's Assessment:  Pt stable, noticing possible pattern of deterioration around father's death anniversary and a difficult experience starting a new job a few days later     Risk Level Assessment  Risk Level Change (if yes, please describe): No    Suicide: low (1)  Violence: low (1)  Addiction: low (1)          DIAGNOSES ASSESSED TODAY (psychiatric diagnoses and medical diagnoses that factor into management of psychiatric treatment): Dissociative disorder NOS; MDD, recurrent, moderate; PTSD by hx    CLINICAL FORMULATION (Make changes as your understanding changes. 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 Pt's strengths include intelligence, sense of humor, perseverence.    REVIEWING TODAY'S VISIT  CLINICAL INTERVENTIONS TODAY:   Gathered information  Offered support  Assessed sx and risk, especially suicidality  Discussed above topics  Discussed therapy ruptures and repairs  Explored methods/interventions that work and don't feel right for pt  Explored and helped pt express emotions that are difficult to access in order to help pt to metabolize and build connections hampered by early  trauma  Discussed uses of both intellectual and emotional working through of trauma and attachment rupture      PATIENT'S RESPONSE TO INTERVENTIONS: Pt was engaged and responsive    PROGRESS TOWARDS GOALS: moderate    TIME SPENT IN PSYCHOTHERAPY: 45 min    PLAN  PLAN FOR MANAGING RISK (Consider risk plan for patients at moderate or high risk for suicide/violence/addiction; medication plan; referrals, etc. Must coincide with treatment plan.): N/A    PLAN FOR ONGOING TREATMENT:    1. Continue tx 2x/week to stabilize pt's improvement  2. Referral to [facility] day tx program if sx increase/if appropriate  3. Referral to neurofeedback  tx if available at [facility] if/when appropriate  4. Consider further group tx or auxiliary tx as appropriate  5. Encourage pt to re-engage in psychopharm consultation as needed  6. Consult with prescriber and PCP as needed     INFORMED CONSENT (for any new treatment): Patient was informed of the potential risks and benefits of the treatment, including the option not to treat, and appeared to understand and agreed to comply. Discussion included the following key points: N/A    [name], PhD, LICSW

## 2016-01-01 ENCOUNTER — Ambulatory Visit (HOSPITAL_BASED_OUTPATIENT_CLINIC_OR_DEPARTMENT_OTHER): Payer: MEDICARE | Admitting: Social Worker

## 2016-01-01 DIAGNOSIS — F331 Major depressive disorder, recurrent, moderate: Secondary | ICD-10-CM

## 2016-01-01 DIAGNOSIS — F449 Dissociative and conversion disorder, unspecified: Secondary | ICD-10-CM

## 2016-01-01 NOTE — Progress Notes (Signed)
Brianna [name] KitchenOUTPATIENT PSYCHIATRY PROGRESS NOTE    INTERPRETER: No    CONTACT INFO FOR OTHER AGENCIES AND MENTAL HEALTH PROVIDERS (IF APPLICABLE): [name]; outside trauma-specialist therapist [name], Psy.D, [phone]; former group therapist [name]     PROBLEM(S) ADDRESSED IN THIS SESSION:   1. Level of functioning, flashbacks, dissociative experiences and behaviors  2. Upcoming death anniversary of father  3. 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       SUBJECTIVE  TODAY'S CHIEF COMPLAINT AND CLINICAL UPDATES IN PATIENT'S WORDS:  1) Chief Complaint (Patient and/or guardian's own words, concerns and expressed thoughts): "The only way i can go into emotion is by what you call "turning away", unable to speak"    2) New information from patient and/or collateral (Patient's illness: context, course, modifying factors, severity, cultural, family, social, medical history):   Pt was on time to the appointment  Father's upcoming death anniversary / pt wanting to continue to process her father's death   Discuss difficulty for pt to experience grief because for her this has meant a dissociated state where she doesn't feel therapist can reach her - 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 to bring together words of emotional support with physical work, and bodyworker wasn't able to do this  Talk of progress still uncomfortable to pt who feels this glosses over her experience of huge deficiencies in being in the world/with herself/with people      OBJECTIVE  DATA REVIEWED (Consider medical labs, radiology, other medical tests; screening/outcome measures; psychological testing; discussion of test results with other clinicians; consultation with other clinicians and systems involved with patient, summary of old records): [place] (make clear medications prescribed by psychiatry; include OTC  medications):    Current Outpatient Prescriptions:  citalopram (CELEXA) 10 MG tablet Take 1 tablet by mouth daily 2 tabs daily. For: depression and anxiety. Disp: 60 tablet Rfl: 0   propranolol (INDERAL) 10 MG tablet Take 1 tablet by mouth 3 (three) times daily as needed For rapid heart rate or anxiety symptoms. Disp: 90 tablet Rfl: 3   THEANINE PO Take  by mouth. Disp:  Rfl:    Multiple Vitamin (MULTIVITAMINS PO)  Disp:  Rfl:      No current facility-administered medications for this visit.     MEDICATION ADHERENCE (including barriers and how addressed): yes     MEDICATION SIDE EFFECTS (Prescribers Only): N/A    BIRTH CONTROL (ask females and males): N/A    CURRENT PREGNANCY: N/A      MENTAL STATUS EXAMINATION                     General Appearance: Groomed white woman wearing weather appropriate clothing, graying medium-length hair     Interaction with Interviewer (eye contact, attitude, behavior): Fair to good eye contact, open - using some humor    Physical Signs    Gait and Station (how patient walks and stands): Within normal limits                  Physical Appearance: Within normal limits          Normal Movements: Within normal limits         Speech (rate, volume, articulation): Within normal limits          Language: Within normal limits  Mood: "close to tears"            Affect: Congruous with content, appropriately variable    Thought Process     Rate; concrete vs abstract reasoning: Within normal limits        Logical vs illogical; associations: loose, tangential, circumstantial, intact: Pt notes she is/can be mildly tangential at times                                    Thought Content    Normal Thought Content (other than safety): Within normal limits     Perceptions (auditory, visual, tactile, etc.): Within normal limits       Impulse Control: Within normal limits         Cognition (Link to [facility])    Orientation (person, place, time): Within normal limits      Recent and remote  memory: Within normal limits           Attention span and concentration: Within normal limits         Fund of knowledge, awareness of current events and vocabulary: Not formally assessed; appear intact      Judgment: Within normal limits          Insight: Moderate to good        Suicidality/Homicidality/Aggression (Victimization or Perpetration): denies/denies/denies     ASSESSMENT   Today's Assessment:  Pt stable, continuing around topic of father's death and own inability to grieve in a non-dissociated way    Risk Level Assessment  Risk Level Change (if yes, please describe): No    Suicide: low (1)  Violence: low (1)  Addiction: low (1)          DIAGNOSES ASSESSED TODAY (psychiatric diagnoses and medical diagnoses that factor into management of psychiatric treatment): Dissociative disorder NOS; MDD, recurrent, moderate; PTSD by hx    CLINICAL FORMULATION (Make changes as your understanding changes. 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 Pt's strengths include intelligence, sense of humor, perseverence.    REVIEWING TODAY'S VISIT  CLINICAL INTERVENTIONS TODAY:   Gathered information  Offered support  Assessed sx and risk, especially suicidality  Discussed above topics  Discussed therapy ruptures and repairs  Explored methods/interventions that work and don't feel right for pt  Explored and  helped pt express emotions that are difficult to access in order to help pt to metabolize and build connections hampered by early trauma  Discussed uses of both intellectual and emotional working through of trauma and attachment rupture      PATIENT'S RESPONSE TO INTERVENTIONS: Pt was engaged and responsive    PROGRESS TOWARDS GOALS: moderate    TIME SPENT IN PSYCHOTHERAPY: 45 min    PLAN  PLAN FOR MANAGING RISK (Consider risk plan for patients at moderate or high risk for suicide/violence/addiction; medication plan; referrals, etc. Must coincide with treatment plan.): N/A    PLAN FOR ONGOING TREATMENT:    1. Continue  tx 2x/week to stabilize pt's improvement  2. Referral to [facility] day tx program if sx increase/if appropriate  3. Referral to neurofeedback tx if available at [facility] if/when appropriate  4. Consider further group tx or auxiliary tx as appropriate  5. Encourage pt to re-engage in psychopharm consultation as needed  6. Consult with prescriber and PCP as needed     INFORMED CONSENT (for any new treatment): Patient was informed of the potential risks and benefits of the treatment, including the option not to treat, and appeared to understand and agreed to comply. Discussion included the following key points: N/A    [name], PhD, LICSW

## 2016-01-03 ENCOUNTER — Ambulatory Visit (HOSPITAL_BASED_OUTPATIENT_CLINIC_OR_DEPARTMENT_OTHER): Payer: MEDICARE | Admitting: Social Worker

## 2016-01-03 DIAGNOSIS — F331 Major depressive disorder, recurrent, moderate: Secondary | ICD-10-CM

## 2016-01-03 DIAGNOSIS — F449 Dissociative and conversion disorder, unspecified: Secondary | ICD-10-CM

## 2016-01-03 NOTE — Progress Notes (Signed)
Marland KitchenOUTPATIENT PSYCHIATRY PROGRESS NOTE    INTERPRETER: No    CONTACT INFO FOR OTHER AGENCIES AND MENTAL HEALTH PROVIDERS (IF APPLICABLE): [name]; outside trauma-specialist therapist [name], Psy.D, [phone]; former group therapist [name]     PROBLEM(S) ADDRESSED IN THIS SESSION:   1. Level of functioning, flashbacks, dissociative experiences and behaviors  2. Events around father's death  3. 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       SUBJECTIVE  TODAY'S CHIEF COMPLAINT AND CLINICAL UPDATES IN PATIENT'S WORDS:  1) Chief Complaint (Patient and/or guardian's own words, concerns and expressed thoughts): "I think someone needs to say 'it was complicated' because it was"    2) New information from patient and/or collateral (Patient's illness: context, course, modifying factors, severity, cultural, family, social, medical history):   Pt was on time to the appointment  Continue on events around father's stroke at 89 (pt will be 9 this year) and further decline with mini-strokes before his death - pt's responses to "first loss" of father with strokes and feeling of relief when he ultimately died - difference in responses between pt "Oh that's such a relief" and her sister's response "Oh, that's so sad"      OBJECTIVE  DATA REVIEWED (Consider medical labs, radiology, other medical tests; screening/outcome measures; psychological testing; discussion of test results with other clinicians; consultation with other clinicians and systems involved with patient, summary of old records): Consulted EMR    CURRENT MEDICATIONS (make clear medications prescribed by psychiatry; include OTC medications):    Current Outpatient Prescriptions:  citalopram (CELEXA) 10 MG tablet Take 1 tablet by mouth daily 2 tabs daily. For: depression and anxiety. Disp: 60 tablet Rfl: 0   propranolol (INDERAL) 10 MG tablet Take 1 tablet by mouth 3 (three) times daily as  needed For rapid heart rate or anxiety symptoms. Disp: 90 tablet Rfl: 3   THEANINE PO Take  by mouth. Disp:  Rfl:    Multiple Vitamin (MULTIVITAMINS PO)  Disp:  Rfl:      No current facility-administered medications for this visit.     MEDICATION ADHERENCE (including barriers and how addressed): yes     MEDICATION SIDE EFFECTS (Prescribers Only): N/A    BIRTH CONTROL (ask females and males): N/A    CURRENT PREGNANCY: N/A      MENTAL STATUS EXAMINATION                     General Appearance: Groomed white woman wearing weather appropriate clothing, graying medium-length hair     Interaction with Interviewer (eye contact, attitude, behavior): Fair to good eye contact, open - using some humor    Physical Signs    Gait and Station (how patient walks and stands): Within normal limits                  Physical Appearance: Within normal limits          Normal Movements: Within normal limits         Speech (rate, volume, articulation): Within normal limits          Language: Within normal limits                       Mood: "mixed up"            Affect: Congruous with content, appropriately variable    Thought Process     Rate; concrete vs abstract reasoning: Within normal limits  Logical vs illogical; associations: loose, tangential, circumstantial, intact: Pt notes she is/can be mildly tangential at times                                    Thought Content    Normal Thought Content (other than safety): Within normal limits     Perceptions (auditory, visual, tactile, etc.): Within normal limits       Impulse Control: Within normal limits         Cognition (Link to [facility])    Orientation (person, place, time): Within normal limits      Recent and remote memory: Within normal limits           Attention span and concentration: Within normal limits         Fund of knowledge, awareness of current events and vocabulary: Not formally assessed; appear intact      Judgment: Within normal limits          Insight: Moderate to  good        Suicidality/Homicidality/Aggression (Victimization or Perpetration): denies/denies/denies     ASSESSMENT   Today's Assessment:  Pt stable, continuing around topic of father's death and own inability to grieve in a non-dissociated way    Risk Level Assessment  Risk Level Change (if yes, please describe): No    Suicide: low (1)  Violence: low (1)  Addiction: low (1)          DIAGNOSES ASSESSED TODAY (psychiatric diagnoses and medical diagnoses that factor into management of psychiatric treatment): Dissociative disorder NOS; MDD, recurrent, moderate; PTSD by hx    CLINICAL FORMULATION (Make changes as your understanding changes. 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 Pt's strengths include intelligence, sense of humor, perseverence.    REVIEWING TODAY'S VISIT  CLINICAL INTERVENTIONS TODAY:   Gathered information  Offered support  Assessed sx and risk, especially suicidality  Discussed above topics  Discussed therapy ruptures and repairs  Explored methods/interventions that work and don't feel right for pt  Explored and helped pt express emotions that are difficult to access in order to help pt to metabolize and build connections hampered by early trauma  Discussed uses of both intellectual and emotional working through of trauma and attachment rupture      PATIENT'S RESPONSE TO  INTERVENTIONS: Pt was engaged and responsive    PROGRESS TOWARDS GOALS: moderate    TIME SPENT IN PSYCHOTHERAPY: 45 min    PLAN  PLAN FOR MANAGING RISK (Consider risk plan for patients at moderate or high risk for suicide/violence/addiction; medication plan; referrals, etc. Must coincide with treatment plan.): N/A    PLAN FOR ONGOING TREATMENT:    1. Continue tx 2x/week to stabilize pt's improvement  2. Referral to [facility] day tx program if sx increase/if appropriate  3. Referral to neurofeedback tx if available at [facility] if/when appropriate  4. Consider further group tx or auxiliary tx as appropriate  5. Encourage pt  to re-engage in psychopharm consultation as needed  6. Consult with prescriber and PCP as needed     INFORMED CONSENT (for any new treatment): Patient was informed of the potential risks and benefits of the treatment, including the option not to treat, and appeared to understand and agreed to comply. Discussion included the following key points: N/A    [name], PhD, LICSW

## 2016-01-08 ENCOUNTER — Ambulatory Visit (HOSPITAL_BASED_OUTPATIENT_CLINIC_OR_DEPARTMENT_OTHER): Payer: MEDICARE | Admitting: Social Worker

## 2016-01-08 DIAGNOSIS — F331 Major depressive disorder, recurrent, moderate: Secondary | ICD-10-CM

## 2016-01-08 DIAGNOSIS — F449 Dissociative and conversion disorder, unspecified: Principal | ICD-10-CM

## 2016-01-08 NOTE — Progress Notes (Signed)
..OUTPATIENT PSYCHIATRY PROGRESS NOTE    INTERPRETER: No    CONTACT INFO FOR OTHER AGENCIES AND MENTAL HEALTH PROVIDERS (IF APPLICABLE): [name]; outside trauma-specialist therapist [name], Psy.D, [phone]; former group therapist [name]     PROBLEM(S) ADDRESSED IN THIS SESSION:   1. Level of functioning, flashbacks, dissociative experiences and behaviors  2. 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 Working with transference/countertransference re: recurring relational patterns/enactments  4.       SUBJECTIVE  TODAY'S CHIEF COMPLAINT AND CLINICAL UPDATES IN PATIENT'S WORDS:  1) Chief Complaint (Patient and/or guardian's own words, concerns and expressed thoughts): "I don't think I've told you I've made great strides lately such than when I verbalize about my father, I say to myself, yes, that's horrible, but that kind of thing has happened to a lot of people, and, bad as it is, it's better to know than just be stuck."    2) New information from patient and/or collateral (Patient's illness: context, course, modifying factors, severity, cultural, family, social, medical history):   Pt was on time to the appointment  Topic today pt's hesitation to bring up emotionally the most threatening/frightening material in therapy and concern about how therapist would be able to take it  Also discuss the recurring relational "scene" or enactment of pt feeling others/therapists are failing her, and then confronting them about their failure after which either pt or therapist gets fired/friendships don't take hold - pt able to take in on an intellectual level the way the "doer" and "done to" keep switching so that she can occupy and has occupied both places - however, it needs further work and processing to really work on these enactments - pt indicates she values the situation stopped and being looked at in a non-aggressive/non-blaming way        OBJECTIVE  DATA REVIEWED (Consider medical labs, radiology, other medical tests; screening/outcome measures; psychological testing; discussion of test results with other clinicians; consultation with other clinicians and systems involved with patient, summary of old records): [place] (make clear medications prescribed by psychiatry; include OTC medications):    Current Outpatient Prescriptions:  citalopram (CELEXA) 10 MG tablet Take 1 tablet by mouth daily 2 tabs daily. For: depression and anxiety. Disp: 60 tablet Rfl: 0   propranolol (INDERAL) 10 MG tablet Take 1 tablet by mouth 3 (three) times daily as needed For rapid heart rate or anxiety symptoms. Disp: 90 tablet Rfl: 3   THEANINE PO Take  by mouth. Disp:  Rfl:    Multiple Vitamin (MULTIVITAMINS PO)  Disp:  Rfl:      No current facility-administered medications for this visit.     MEDICATION ADHERENCE (including barriers and how addressed): yes     MEDICATION SIDE EFFECTS (Prescribers Only): N/A    BIRTH CONTROL (ask females and males): N/A    CURRENT PREGNANCY: N/A      MENTAL STATUS EXAMINATION                     General Appearance: Groomed white woman wearing weather appropriate clothing, graying medium-length hair     Interaction with Interviewer (eye contact, attitude, behavior): Fair to good eye contact, open - using some humor    Physical Signs    Gait and Station (how patient walks and stands): Within normal limits                  Physical Appearance: Within normal  limits          Normal Movements: Within normal limits         Speech (rate, volume, articulation): Within normal limits          Language: Within normal limits                       Mood: "I don't really want to access sadness as I have to be together in 1.5 hrs"            Affect: Congruous with content, appropriately variable    Thought Process     Rate; concrete vs abstract reasoning: Within normal limits        Logical vs illogical; associations:  loose, tangential, circumstantial, intact: Pt notes she is/can be mildly tangential at times                                    Thought Content    Normal Thought Content (other than safety): Within normal limits     Perceptions (auditory, visual, tactile, etc.): Within normal limits       Impulse Control: Within normal limits         Cognition (Link to [facility])    Orientation (person, place, time): Within normal limits      Recent and remote memory: Within normal limits           Attention span and concentration: Within normal limits         Fund of knowledge, awareness of current events and vocabulary: Not formally assessed; appear intact      Judgment: Within normal limits          Insight: Moderate to good        Suicidality/Homicidality/Aggression (Victimization or Perpetration): denies/denies/denies     ASSESSMENT   Today's Assessment:  Pt stable, nearing the topic of specific childhood trauma but not ready to process yet     Risk Level Assessment  Risk Level Change (if yes, please describe): No    Suicide: low (1)  Violence: low (1)  Addiction: low (1)          DIAGNOSES ASSESSED TODAY (psychiatric diagnoses and medical diagnoses that factor into management of psychiatric treatment): Dissociative disorder NOS; MDD, recurrent, moderate; PTSD by hx    CLINICAL FORMULATION (Make changes as your understanding changes. 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 Pt's strengths include intelligence, sense of humor, perseverence.    REVIEWING TODAY'S VISIT  CLINICAL INTERVENTIONS TODAY:   Gathered information  Offered support  Assessed sx and risk, especially suicidality  Discussed above topics  Discussed therapy ruptures and repairs  Explored methods/interventions that work and don't feel right for pt  Explored and helped pt express emotions that are difficult to access in order to help pt to metabolize and build connections hampered by early trauma  Discussed uses of both intellectual and emotional working through of  trauma and attachment rupture  Used interactions in therapy to examine past and present interpersonal difficulties    PATIENT'S RESPONSE TO INTERVENTIONS: Pt was engaged and responsive    PROGRESS TOWARDS GOALS: good    TIME SPENT IN PSYCHOTHERAPY: 45 min    PLAN  PLAN FOR MANAGING RISK (Consider risk plan for patients at moderate or high risk for suicide/violence/addiction; medication plan; referrals, etc. Must coincide with treatment plan.): N/A    PLAN FOR ONGOING TREATMENT:    1. Continue tx 2x/week to stabilize pt's improvement  2. Referral to [facility] day tx program if sx increase/if appropriate  3. Referral to neurofeedback tx if available at [facility] if/when appropriate  4. Consider further group tx or auxiliary tx as appropriate  5. Encourage pt to re-engage in psychopharm consultation as needed  6. Consult with prescriber and PCP as needed     INFORMED CONSENT (for any new treatment): Patient was informed of the potential risks and benefits of the treatment, including the option not to treat, and appeared to understand and agreed to comply. Discussion included the following key points: N/A    [name], PhD, LICSW

## 2016-01-10 ENCOUNTER — Ambulatory Visit (HOSPITAL_BASED_OUTPATIENT_CLINIC_OR_DEPARTMENT_OTHER): Payer: MEDICARE | Admitting: Social Worker

## 2016-01-10 DIAGNOSIS — F331 Major depressive disorder, recurrent, moderate: Secondary | ICD-10-CM

## 2016-01-10 DIAGNOSIS — F449 Dissociative and conversion disorder, unspecified: Principal | ICD-10-CM

## 2016-01-10 NOTE — Progress Notes (Signed)
Marland KitchenOUTPATIENT PSYCHIATRY PROGRESS NOTE    INTERPRETER: No    CONTACT INFO FOR OTHER AGENCIES AND MENTAL HEALTH PROVIDERS (IF APPLICABLE): [name]; outside trauma-specialist therapist [name], Psy.D, [phone]; former group therapist [name]     PROBLEM(S) ADDRESSED IN THIS SESSION:   1. Level of functioning, flashbacks, dissociative experiences and behaviors  2. 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 Working with transference/countertransference re: recurring relational patterns/enactments  4.       SUBJECTIVE  TODAY'S CHIEF COMPLAINT AND CLINICAL UPDATES IN PATIENT'S WORDS:  1) Chief Complaint (Patient and/or guardian's own words, concerns and expressed thoughts): "I'm confused about a few things that happened Tuesday"    2) New information from patient and/or collateral (Patient's illness: context, course, modifying factors, severity, cultural, family, social, medical history):   Pt was on time to the appointment  Continue to work in transference/countertransference on the recurring enactment of pt's dissatisfaction about the other not able to give her what she wants/needs, expressing her dissatisfaction in various ways, and the relationship not surviving this  "Even if we never get to all the things that happened to me as a child, if we were able to work through my past experiences in therapy, that would be a really big thing"       OBJECTIVE  DATA REVIEWED (Consider medical labs, radiology, other medical tests; screening/outcome measures; psychological testing; discussion of test results with other clinicians; consultation with other clinicians and systems involved with patient, summary of old records): [place] (make clear medications prescribed by psychiatry; include OTC medications):    Current Outpatient Prescriptions:  citalopram (CELEXA) 10 MG tablet Take 1 tablet by mouth daily 2 tabs daily. For:  depression and anxiety. Disp: 60 tablet Rfl: 0   propranolol (INDERAL) 10 MG tablet Take 1 tablet by mouth 3 (three) times daily as needed For rapid heart rate or anxiety symptoms. Disp: 90 tablet Rfl: 3   THEANINE PO Take  by mouth. Disp:  Rfl:    Multiple Vitamin (MULTIVITAMINS PO)  Disp:  Rfl:      No current facility-administered medications for this visit.     MEDICATION ADHERENCE (including barriers and how addressed): yes     MEDICATION SIDE EFFECTS (Prescribers Only): N/A    BIRTH CONTROL (ask females and males): N/A    CURRENT PREGNANCY: N/A      MENTAL STATUS EXAMINATION                     General Appearance: Groomed white woman wearing weather appropriate clothing, graying medium-length hair     Interaction with Interviewer (eye contact, attitude, behavior): Fair to good eye contact, open - using some humor    Physical Signs    Gait and Station (how patient walks and stands): Within normal limits                  Physical Appearance: Within normal limits          Normal Movements: Within normal limits         Speech (rate, volume, articulation): Within normal limits          Language: Within normal limits                       Mood: "nervous"            Affect: Congruous with content, appropriately variable    Thought Process  Rate; concrete vs abstract reasoning: Within normal limits        Logical vs illogical; associations: loose, tangential, circumstantial, intact: Pt notes she is/can be mildly tangential at times                                    Thought Content    Normal Thought Content (other than safety): Within normal limits     Perceptions (auditory, visual, tactile, etc.): Within normal limits       Impulse Control: Within normal limits         Cognition (Link to [facility])    Orientation (person, place, time): Within normal limits      Recent and remote memory: Within normal limits           Attention span and concentration: Within normal limits         Fund of knowledge, awareness of current  events and vocabulary: Not formally assessed; appear intact      Judgment: Within normal limits          Insight: Moderate to good        Suicidality/Homicidality/Aggression (Victimization or Perpetration): denies/denies/denies     ASSESSMENT   Today's Assessment:  Pt stable, working on repeating enactment in therapy & outside therapy     Risk Level Assessment  Risk Level Change (if yes, please describe): No    Suicide: low (1)  Violence: low (1)  Addiction: low (1)          DIAGNOSES ASSESSED TODAY (psychiatric diagnoses and medical diagnoses that factor into management of psychiatric treatment): Dissociative disorder NOS; MDD, recurrent, moderate; PTSD by hx    CLINICAL FORMULATION (Make changes as your understanding changes. 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 Pt's strengths include intelligence, sense of humor, perseverence.    REVIEWING TODAY'S VISIT  CLINICAL INTERVENTIONS TODAY:   Gathered information  Offered support  Assessed sx and risk, especially suicidality  Discussed above topics  Discussed therapy ruptures and repairs  Explored methods/interventions that work and don't feel right for pt  Explored and helped pt express emotions that are difficult to access in order to help pt to metabolize and build connections hampered by early trauma  Discussed uses of both intellectual and  emotional working through of trauma and attachment rupture  Used interactions in therapy to examine past and present interpersonal difficulties    PATIENT'S RESPONSE TO INTERVENTIONS: Pt was engaged and responsive    PROGRESS TOWARDS GOALS: good    TIME SPENT IN PSYCHOTHERAPY: 45 min    [place] (Consider risk plan for patients at moderate or high risk for suicide/violence/addiction; medication plan; referrals, etc. Must coincide with treatment plan.): N/A    PLAN FOR ONGOING TREATMENT:    1. Continue tx 2x/week to stabilize pt's improvement  2. Referral to [facility] day tx program if sx increase/if appropriate  3. Referral to neurofeedback tx if  available at [facility] if/when appropriate  4. Consider further group tx or auxiliary tx as appropriate  5. Encourage pt to re-engage in psychopharm consultation as needed  6. Consult with prescriber and PCP as needed     INFORMED CONSENT (for any new treatment): Patient was informed of the potential risks and benefits of the treatment, including the option not to treat, and appeared to understand and agreed to comply. Discussion included the following key points: N/A    [name], PhD, LICSW

## 2016-01-15 ENCOUNTER — Ambulatory Visit (HOSPITAL_BASED_OUTPATIENT_CLINIC_OR_DEPARTMENT_OTHER): Payer: MEDICARE | Admitting: Social Worker

## 2016-01-15 DIAGNOSIS — F331 Major depressive disorder, recurrent, moderate: Secondary | ICD-10-CM

## 2016-01-15 DIAGNOSIS — F449 Dissociative and conversion disorder, unspecified: Principal | ICD-10-CM

## 2016-01-15 NOTE — Progress Notes (Signed)
Marland KitchenOUTPATIENT PSYCHIATRY PROGRESS NOTE    INTERPRETER: No    CONTACT INFO FOR OTHER AGENCIES AND MENTAL HEALTH PROVIDERS (IF APPLICABLE): [name]; outside trauma-specialist therapist [name], Psy.D, [phone]; former group therapist [name]     PROBLEM(S) ADDRESSED IN THIS SESSION:   1. Level of functioning, flashbacks, dissociative experiences and behaviors  2. 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 Working with transference/countertransference re: recurring relational patterns/enactments  4.       SUBJECTIVE  TODAY'S CHIEF COMPLAINT AND CLINICAL UPDATES IN PATIENT'S WORDS:  1) Chief Complaint (Patient and/or guardian's own words, concerns and expressed thoughts): "I think I need you to label things for me"    2) New information from patient and/or collateral (Patient's illness: context, course, modifying factors, severity, cultural, family, social, medical history):   Pt was on time to the appointment  Continue to work through transference/countertransference on the recurring enactment of pt's dissatisfaction about the other not able to give her what she wants/needs, expressing her dissatisfaction in various ways, and the relationship not surviving this  "I feel like we've never been at a 'crunch' yet"  Open to idea of conceptualizing this fundamental repeating situation/enactment and trying to change the 'how' of responding to it (loss of empathy, loss of benevolence, not trying to imaginatively inhabit and empathize with the other's experience) on a less acute level as what has ended up breaking tx relationships in the past        OBJECTIVE  DATA REVIEWED (Consider medical labs, radiology, other medical tests; screening/outcome measures; psychological testing; discussion of test results with other clinicians; consultation with other clinicians and systems involved with patient, summary of old records): Consulted EMR    [place] (make clear medications prescribed by psychiatry; include OTC medications):    Current Outpatient Prescriptions:  citalopram (CELEXA) 10 MG tablet Take 1 tablet by mouth daily 2 tabs daily. For: depression and anxiety. Disp: 60 tablet Rfl: 0   propranolol (INDERAL) 10 MG tablet Take 1 tablet by mouth 3 (three) times daily as needed For rapid heart rate or anxiety symptoms. Disp: 90 tablet Rfl: 3   THEANINE PO Take  by mouth. Disp:  Rfl:    Multiple Vitamin (MULTIVITAMINS PO)  Disp:  Rfl:      No current facility-administered medications for this visit.     MEDICATION ADHERENCE (including barriers and how addressed): yes     MEDICATION SIDE EFFECTS (Prescribers Only): N/A    BIRTH CONTROL (ask females and males): N/A    CURRENT PREGNANCY: N/A      MENTAL STATUS EXAMINATION                     General Appearance: Groomed white woman wearing weather appropriate clothing, graying medium-length hair     Interaction with Interviewer (eye contact, attitude, behavior): Fair to good eye contact, open - using some humor    Physical Signs    Gait and Station (how patient walks and stands): Within normal limits                  Physical Appearance: Within normal limits          Normal Movements: Within normal limits         Speech (rate, volume, articulation): Within normal limits          Language: Within normal limits  Mood: "it sounds like my voice is constricted"           Affect: Congruous with content, appropriately variable    Thought Process     Rate; concrete vs abstract reasoning: Within normal limits        Logical vs illogical; associations: loose, tangential, circumstantial, intact: Pt notes she is/can be mildly tangential at times                                    Thought Content    Normal Thought Content (other than safety): Within normal limits     Perceptions (auditory, visual, tactile, etc.): Within normal limits       Impulse Control: Within normal limits         Cognition  (Link to [facility])    Orientation (person, place, time): Within normal limits      Recent and remote memory: Within normal limits           Attention span and concentration: Within normal limits         Fund of knowledge, awareness of current events and vocabulary: Not formally assessed; appear intact      Judgment: Within normal limits          Insight: Moderate to good        Suicidality/Homicidality/Aggression (Victimization or Perpetration): denies/denies/denies     ASSESSMENT   Today's Assessment:  Pt stable, continuing to focus on repetitive enactment in therapy & outside therapy     Risk Level Assessment  Risk Level Change (if yes, please describe): No    Suicide: low (1)  Violence: low (1)  Addiction: low (1)          DIAGNOSES ASSESSED TODAY (psychiatric diagnoses and medical diagnoses that factor into management of psychiatric treatment): Dissociative disorder NOS; MDD, recurrent, moderate; PTSD by hx    CLINICAL FORMULATION (Make changes as your understanding changes. 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 Pt's strengths include intelligence, sense of humor, perseverence.    REVIEWING TODAY'S VISIT  CLINICAL INTERVENTIONS TODAY:   Gathered information  Offered support  Assessed sx and risk, especially suicidality  Discussed above topics  Discussed therapy ruptures and repairs  Explored  methods/interventions that work and don't feel right for pt  Explored and helped pt express emotions that are difficult to access in order to help pt to metabolize and build connections hampered by early trauma  Discussed uses of both intellectual and emotional working through of trauma and attachment rupture  Used slowing down and breaking down interactions in therapy to examine past and present interpersonal difficulties    PATIENT'S RESPONSE TO INTERVENTIONS: Pt was engaged and responsive    PROGRESS TOWARDS GOALS: moderate    TIME SPENT IN PSYCHOTHERAPY: 45 min    PLAN  PLAN FOR MANAGING RISK (Consider risk plan for patients at moderate or high risk for suicide/violence/addiction; medication plan; referrals, etc. Must coincide with  treatment plan.): N/A    PLAN FOR ONGOING TREATMENT:    1. Continue tx 2x/week to stabilize pt's improvement  2. Referral to [facility] day tx program if sx increase/if appropriate  3. Referral to neurofeedback tx if available at [facility] if/when appropriate  4. Consider further group tx or auxiliary tx as appropriate  5. Encourage pt to re-engage in psychopharm consultation as needed  6. Consult with prescriber and PCP as needed     INFORMED CONSENT (for any new treatment): Patient was informed of the potential risks and benefits of the treatment, including the option not to treat, and appeared to understand and agreed to comply. Discussion included the following key points: N/A    [name], PhD, LICSW

## 2016-01-17 ENCOUNTER — Ambulatory Visit (HOSPITAL_BASED_OUTPATIENT_CLINIC_OR_DEPARTMENT_OTHER): Payer: MEDICARE | Admitting: Social Worker

## 2016-01-17 DIAGNOSIS — F449 Dissociative and conversion disorder, unspecified: Secondary | ICD-10-CM

## 2016-01-17 DIAGNOSIS — F331 Major depressive disorder, recurrent, moderate: Secondary | ICD-10-CM

## 2016-01-17 NOTE — Progress Notes (Signed)
Marland KitchenOUTPATIENT PSYCHIATRY PROGRESS NOTE    INTERPRETER: No    CONTACT INFO FOR OTHER AGENCIES AND MENTAL HEALTH PROVIDERS (IF APPLICABLE): [name]; outside trauma-specialist therapist [name], Psy.D, [phone]; former group therapist [name]     PROBLEM(S) ADDRESSED IN THIS SESSION:   1. Level of functioning, flashbacks, dissociative experiences and behaviors  2. Use of writing as adjunct to tx  3. Working with transference/countertransference re: recurring relational patterns/enactments  4. Working on Runner, broadcasting/film/video between thinking/feeling/experiencing       [place] PATIENT'S WORDS:  1) Chief Complaint (Patient and/or guardian's own words, concerns and expressed thoughts): "I think there have been at least three times this session you're not responding to me but just repeating your own viewpoint"    2) New information from patient and/or collateral (Patient's illness: context, course, modifying factors, severity, cultural, family, social, medical history):   Pt was on time to the appointment  Discussed pt's use of email as adjunct to tx/way of organizing her thoughts for an "agenda" for a tx session; agreed pt will continue to send them but with the understanding provider won't be able to read/respond to them between sessions  Pt apprehensive about bringing her concerns directly to session "because I've been retraumatized so many times in therapy by the therapist's response" - needs to protect herself first and foremost if not this tx relationship  Pt is feeling that it's impossible to expect her to have empathy for others' point of view before she has experienced empathy for her suffering; and that the expectation of this by others is an example of a misattunement/misunderstanding of the neurophysiological level of which she feels "not human" or not developed normally        OBJECTIVE  DATA REVIEWED (Consider  medical labs, radiology, other medical tests; screening/outcome measures; psychological testing; discussion of test results with other clinicians; consultation with other clinicians and systems involved with patient, summary of old records): Consulted EMR    [place] (make clear medications prescribed by psychiatry; include OTC medications):    Current Outpatient Prescriptions:  citalopram (CELEXA) 10 MG tablet Take 1 tablet by mouth daily 2 tabs daily. For: depression and anxiety. Disp: 60 tablet Rfl: 0   propranolol (INDERAL) 10 MG tablet Take 1 tablet by mouth 3 (three) times daily as needed For rapid heart rate or anxiety symptoms. Disp: 90 tablet Rfl: 3   THEANINE PO Take  by mouth. Disp:  Rfl:    Multiple Vitamin (MULTIVITAMINS PO)  Disp:  Rfl:      No current facility-administered medications for this visit.     MEDICATION ADHERENCE (including barriers and how addressed): yes     MEDICATION SIDE EFFECTS (Prescribers Only): N/A    BIRTH CONTROL (ask females and males): N/A    CURRENT PREGNANCY: N/A      MENTAL STATUS EXAMINATION                     General Appearance: Groomed white woman wearing weather appropriate clothing, graying medium-length hair     Interaction with Interviewer (eye contact, attitude, behavior): Fair to good eye contact, more anger and dissatisfaction shown    Physical Signs    Gait and Station (how patient walks and stands): Within normal limits                  Physical Appearance: Within normal limits  Normal Movements: Within normal limits         Speech (rate, volume, articulation): Within normal limits          Language: Within normal limits                       Mood: "I feel not heard"           Affect: Congruous with content, variable    Thought Process     Rate; concrete vs abstract reasoning: Within normal limits        Logical vs illogical; associations: loose, tangential, circumstantial, intact: Pt notes she is/can be mildly tangential at times                                     Thought Content    Normal Thought Content (other than safety): Within normal limits     Perceptions (auditory, visual, tactile, etc.): Within normal limits       Impulse Control: Within normal limits         Cognition (Link to [facility])    Orientation (person, place, time): Within normal limits      Recent and remote memory: Within normal limits           Attention span and concentration: Within normal limits         Fund of knowledge, awareness of current events and vocabulary: Not formally assessed; appear intact      Judgment: Within normal limits          Insight: Moderate to good        Suicidality/Homicidality/Aggression (Victimization or Perpetration): denies/denies/denies     ASSESSMENT   Today's Assessment:  Pt stable, continues to grapple with how to express her dissatisfaction and disappointment to others in ways that would not leave her cut off and lonely     Risk Level Assessment  Risk Level Change (if yes, please describe): No    Suicide: low (1)  Violence: low (1)  Addiction: low (1)          DIAGNOSES ASSESSED TODAY (psychiatric diagnoses and medical diagnoses that factor into management of psychiatric treatment): Dissociative disorder NOS; MDD, recurrent, moderate; PTSD by hx    CLINICAL FORMULATION (Make changes as your understanding changes. 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 Pt's strengths include intelligence, sense of humor, perseverence.    REVIEWING TODAY'S VISIT  CLINICAL INTERVENTIONS TODAY:   Gathered information  Offered support  Assessed sx and risk, especially suicidality  Discussed above topics  Discussed therapy ruptures and repairs  Explored methods/interventions that work and don't feel right for pt  Explored and helped pt express emotions that are difficult to access in order to help pt to metabolize and build connections hampered by early trauma  Discussed uses of both intellectual and emotional working through of trauma and attachment rupture    PATIENT'S RESPONSE TO INTERVENTIONS: Pt was  engaged and responsive    PROGRESS TOWARDS GOALS: moderate    TIME SPENT IN PSYCHOTHERAPY: 45 min    PLAN  PLAN FOR MANAGING RISK (Consider risk plan for patients at moderate or high risk for suicide/violence/addiction; medication plan; referrals, etc. Must coincide with treatment plan.): N/A    PLAN FOR ONGOING TREATMENT:    1. Continue tx 2x/week to stabilize pt's improvement  2. Referral to [facility] day tx program if sx increase/if appropriate  3. Referral to [facility]feedback tx if available at [facility] if/when appropriate  4. Consider further group tx or auxiliary tx as appropriate  5. Encourage pt to re-engage in psychopharm consultation as needed  6. Consult with prescriber and PCP as needed     INFORMED CONSENT (for any new treatment): Patient was informed of the potential risks and benefits of the treatment, including the option not to treat, and appeared to understand and agreed to comply. Discussion included the following key points: N/A    [name], PhD, LICSW

## 2016-01-22 ENCOUNTER — Ambulatory Visit (HOSPITAL_BASED_OUTPATIENT_CLINIC_OR_DEPARTMENT_OTHER): Payer: MEDICARE | Admitting: Social Worker

## 2016-01-22 DIAGNOSIS — F331 Major depressive disorder, recurrent, moderate: Secondary | ICD-10-CM

## 2016-01-22 DIAGNOSIS — F449 Dissociative and conversion disorder, unspecified: Secondary | ICD-10-CM

## 2016-01-22 NOTE — Progress Notes (Signed)
Brianna [name] KitchenOUTPATIENT PSYCHIATRY PROGRESS NOTE    INTERPRETER: No    CONTACT INFO FOR OTHER AGENCIES AND MENTAL HEALTH PROVIDERS (IF APPLICABLE): [name]; outside trauma-specialist therapist [name], Psy.D, [phone]; former group therapist [name]     PROBLEM(S) ADDRESSED IN THIS SESSION:   1. Level of functioning, flashbacks, dissociative experiences and behaviors  2. Use of writing as adjunct to tx  3. Working with transference/countertransference re: recurring relational patterns/enactments  4. Working on Runner, broadcasting/film/video between thinking/feeling/experiencing       [place] PATIENT'S WORDS:  1) Chief Complaint (Patient and/or guardian's own words, concerns and expressed thoughts): "I don't have a well of empathy to draw from"    2) New information from patient and/or collateral (Patient's illness: context, course, modifying factors, severity, cultural, family, social, medical history):   Pt was on time to the appointment  Pt continues to process provider's decision to not be able to read (& process) emails pt sends between sessions; is feeling some distrust and concern towards provider and "not knowing what's going on"  "Two stories come to mind: that a supervisor intervened or that you're having second thoughts about continuing to work with me"      OBJECTIVE  DATA REVIEWED (Consider medical labs, radiology, other medical tests; screening/outcome measures; psychological testing; discussion of test results with other clinicians; consultation with other clinicians and systems involved with patient, summary of old records): Consulted EMR    CURRENT MEDICATIONS (make clear medications prescribed by psychiatry; include OTC medications):    Current Outpatient Prescriptions:  citalopram (CELEXA) 10 MG tablet Take 1 tablet by mouth daily 2 tabs daily. For: depression and anxiety. Disp: 60 tablet Rfl: 0   propranolol (INDERAL)  10 MG tablet Take 1 tablet by mouth 3 (three) times daily as needed For rapid heart rate or anxiety symptoms. Disp: 90 tablet Rfl: 3   THEANINE PO Take  by mouth. Disp:  Rfl:    Multiple Vitamin (MULTIVITAMINS PO)  Disp:  Rfl:      No current facility-administered medications for this visit.     MEDICATION ADHERENCE (including barriers and how addressed): yes     MEDICATION SIDE EFFECTS (Prescribers Only): N/A    BIRTH CONTROL (ask females and males): N/A    CURRENT PREGNANCY: N/A      MENTAL STATUS EXAMINATION                     General Appearance: Groomed white woman wearing weather appropriate clothing, graying medium-length hair     Interaction with Interviewer (eye contact, attitude, behavior): Fair to good eye contact, more anger and dissatisfaction shown    Physical Signs    Gait and Station (how patient walks and stands): Within normal limits                  Physical Appearance: Within normal limits          Normal Movements: Within normal limits         Speech (rate, volume, articulation): Within normal limits          Language: Within normal limits                       Mood: "my voice sounds more vehement"           Affect: Congruous with content, variable    Thought Process     Rate; concrete vs  abstract reasoning: Within normal limits        Logical vs illogical; associations: loose, tangential, circumstantial, intact: Pt notes she is/can be mildly tangential at times                                    Thought Content    Normal Thought Content (other than safety): Within normal limits     Perceptions (auditory, visual, tactile, etc.): Within normal limits       Impulse Control: Within normal limits         Cognition (Link to [facility])    Orientation (person, place, time): Within normal limits      Recent and remote memory: Within normal limits           Attention span and concentration: Within normal limits         Fund of knowledge, awareness of current events and vocabulary: Not formally assessed;  appear intact      Judgment: Within normal limits          Insight: Moderate to good        Suicidality/Homicidality/Aggression (Victimization or Perpetration): denies/denies/denies     ASSESSMENT   Today's Assessment:  Pt stable, processing disappointment in and anger at provider    Risk Level Assessment  Risk Level Change (if yes, please describe): No    Suicide: low (1)  Violence: low (1)  Addiction: low (1)          DIAGNOSES ASSESSED TODAY (psychiatric diagnoses and medical diagnoses that factor into management of psychiatric treatment): Dissociative disorder NOS; MDD, recurrent, moderate; PTSD by hx    CLINICAL FORMULATION (Make changes as your understanding changes. 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 Pt's strengths include intelligence, sense of humor, perseverence.    REVIEWING TODAY'S VISIT  CLINICAL INTERVENTIONS TODAY:   Gathered information  Offered support  Assessed sx and risk, especially suicidality  Discussed above topics  Discussed therapy ruptures and repairs  Explored methods/interventions that work and don't feel right for pt  Explored and helped pt express emotions that are difficult to access in order to help pt to metabolize and build connections hampered by early trauma  Discussed uses of both intellectual and emotional working through of trauma and attachment  rupture    PATIENT'S RESPONSE TO INTERVENTIONS: Pt was engaged and responsive    PROGRESS TOWARDS GOALS: moderate    TIME SPENT IN PSYCHOTHERAPY: 45 min    PLAN  PLAN FOR MANAGING RISK (Consider risk plan for patients at moderate or high risk for suicide/violence/addiction; medication plan; referrals, etc. Must coincide with treatment plan.): N/A    PLAN FOR ONGOING TREATMENT:    1. Continue tx 2x/week to stabilize pt's improvement  2. Referral to [facility] day tx program if sx increase/if appropriate  3. Referral to neurofeedback tx if available at [facility] if/when appropriate  4. Consider further group tx or auxiliary tx as appropriate  5.  Encourage pt to re-engage in psychopharm consultation as needed  6. Consult with prescriber and PCP as needed     INFORMED CONSENT (for any new treatment): Patient was informed of the potential risks and benefits of the treatment, including the option not to treat, and appeared to understand and agreed to comply. Discussion included the following key points: N/A    [name], PhD, LICSW

## 2016-01-24 ENCOUNTER — Ambulatory Visit (HOSPITAL_BASED_OUTPATIENT_CLINIC_OR_DEPARTMENT_OTHER): Payer: MEDICARE | Admitting: Social Worker

## 2016-01-24 DIAGNOSIS — F449 Dissociative and conversion disorder, unspecified: Principal | ICD-10-CM

## 2016-01-24 DIAGNOSIS — F331 Major depressive disorder, recurrent, moderate: Secondary | ICD-10-CM

## 2016-01-24 NOTE — Progress Notes (Signed)
Brianna KitchenOUTPATIENT PSYCHIATRY PROGRESS NOTE    INTERPRETER: No    CONTACT INFO FOR OTHER AGENCIES AND MENTAL HEALTH PROVIDERS (IF APPLICABLE): [name]; outside trauma-specialist therapist [name], Psy.D, [phone]; former group therapist [name]     PROBLEM(S) ADDRESSED IN THIS SESSION:   1. Level of functioning, flashbacks, dissociative experiences and behaviors  2. Use of writing as adjunct to tx  3. Working with transference/countertransference re: recurring relational patterns/enactments  4. Working on Runner, broadcasting/film/video between thinking/feeling/experiencing       [name]:  1) Chief Complaint (Patient and/or guardian's own words, concerns and expressed thoughts): "It worries me that I see more than you appear to see"    2) New information from patient and/or collateral (Patient's illness: context, course, modifying factors, severity, cultural, family, social, medical history):   Pt was on time to the appointment  Continuing to process distrust and concern about therapy & therapist and ways of working; able to communicate in more depth ways pt felt triggered and also where she didn't feel triggered  "Like Brianna [name] kept saying 'the therapist needs to change'"  Possability of meeting with auxiliary trauma therapist in the summer    [place] (Consider medical labs, radiology, other medical tests; screening/outcome measures; psychological testing; discussion of test results with other clinicians; consultation with other clinicians and systems involved with patient, summary of old records): [place] (make clear medications prescribed by psychiatry; include OTC medications):    Current Outpatient Prescriptions:  citalopram (CELEXA) 10 MG tablet Take 1 tablet by mouth daily 2 tabs daily. For: depression and anxiety. Disp: 60 tablet Rfl: 0   propranolol  (INDERAL) 10 MG tablet Take 1 tablet by mouth 3 (three) times daily as needed For rapid heart rate or anxiety symptoms. Disp: 90 tablet Rfl: 3   THEANINE PO Take  by mouth. Disp:  Rfl:    Multiple Vitamin (MULTIVITAMINS PO)  Disp:  Rfl:      No current facility-administered medications for this visit.     MEDICATION ADHERENCE (including barriers and how addressed): yes     MEDICATION SIDE EFFECTS (Prescribers Only): N/A    BIRTH CONTROL (ask females and males): N/A    CURRENT PREGNANCY: N/A      MENTAL STATUS EXAMINATION                     General Appearance: Groomed white woman wearing weather appropriate clothing, graying medium-length hair     Interaction with Interviewer (eye contact, attitude, behavior): Fair to good eye contact, more dissatisfaction shown    Physical Signs    Gait and Station (how patient walks and stands): Within normal limits                  Physical Appearance: Within normal limits          Normal Movements: Within normal limits         Speech (rate, volume, articulation): Within normal limits          Language: Within normal limits                       Mood: "I don't even know where to start"           Affect: Congruous with content, variable    Thought Process     Rate; concrete vs abstract reasoning:  Within normal limits        Logical vs illogical; associations: loose, tangential, circumstantial, intact: Pt notes she is/can be mildly tangential at times                                    Thought Content    Normal Thought Content (other than safety): Within normal limits     Perceptions (auditory, visual, tactile, etc.): Within normal limits       Impulse Control: Within normal limits         Cognition (Link to [facility])    Orientation (person, place, time): Within normal limits      Recent and remote memory: Within normal limits           Attention span and concentration: Within normal limits         Fund of knowledge, awareness of current events and vocabulary: Not formally assessed;  appear intact      Judgment: Within normal limits          Insight: Moderate to good        Suicidality/Homicidality/Aggression (Victimization or Perpetration): denies/denies/denies     ASSESSMENT   Today's Assessment:  Pt stable, processing disappointment in and anger at provider but appears to be doing this successfully    Risk Level Assessment  Risk Level Change (if yes, please describe): No    Suicide: low (1)  Violence: low (1)  Addiction: low (1)          DIAGNOSES ASSESSED TODAY (psychiatric diagnoses and medical diagnoses that factor into management of psychiatric treatment): Dissociative disorder NOS; MDD, recurrent, moderate; PTSD by hx    CLINICAL FORMULATION (Make changes as your understanding changes. 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 Pt's strengths include intelligence, sense of humor, perseverence.    REVIEWING TODAY'S VISIT  CLINICAL INTERVENTIONS TODAY:   Gathered information  Offered support  Assessed sx and risk, especially suicidality  Discussed above topics  Discussed therapy ruptures and repairs  Explored methods/interventions that work and don't feel right for pt  Explored and helped pt express emotions that are difficult to access in order to help pt to metabolize and build connections hampered by early trauma  Discussed uses of both intellectual and emotional  working through of trauma and attachment rupture  Discussed auxiliary tx w/ pt's outside trauma therapist    PATIENT'S RESPONSE TO INTERVENTIONS: Pt was engaged and responsive    PROGRESS TOWARDS GOALS: moderate    TIME SPENT IN PSYCHOTHERAPY: 45 min    PLAN  PLAN FOR MANAGING RISK (Consider risk plan for patients at moderate or high risk for suicide/violence/addiction; medication plan; referrals, etc. Must coincide with treatment plan.): N/A    PLAN FOR ONGOING TREATMENT:    1. Continue tx 2x/week to stabilize pt's improvement  2. Referral to [facility] day tx program if sx increase/if appropriate  3. Referral to neurofeedback tx if available at [place]  appropriate  4. Consider further group tx or auxiliary tx as appropriate  5. Encourage pt to re-engage in psychopharm consultation as needed  6. Consult with prescriber and PCP as needed     INFORMED CONSENT (for any new treatment): Patient was informed of the potential risks and benefits of the treatment, including the option not to treat, and appeared to understand and agreed to comply. Discussion included the following key points: N/A    [name], PhD, LICSW

## 2016-01-29 ENCOUNTER — Ambulatory Visit (HOSPITAL_BASED_OUTPATIENT_CLINIC_OR_DEPARTMENT_OTHER): Payer: MEDICARE | Admitting: Social Worker

## 2016-01-29 DIAGNOSIS — F331 Major depressive disorder, recurrent, moderate: Secondary | ICD-10-CM

## 2016-01-29 DIAGNOSIS — F449 Dissociative and conversion disorder, unspecified: Secondary | ICD-10-CM

## 2016-01-29 NOTE — Progress Notes (Signed)
[place] KitchenOUTPATIENT PSYCHIATRY PROGRESS NOTE    INTERPRETER: No    CONTACT INFO FOR OTHER AGENCIES AND MENTAL HEALTH PROVIDERS (IF APPLICABLE): [name]; outside trauma-specialist therapist [name], Psy.D, [phone]; former group therapist [name]     PROBLEM(S) ADDRESSED IN THIS SESSION:   1. Level of functioning, flashbacks, dissociative experiences and behaviors  2. Difficulty brining certain topics to therapy  3. Working with transference/countertransference re: recurring relational patterns/enactments  4. Working on Runner, broadcasting/film/video between thinking/feeling/experiencing       [place] PATIENT'S WORDS:  1) Chief Complaint (Patient and/or guardian's own words, concerns and expressed thoughts): "I can't believe you thought that I thought that"    2) New information from patient and/or collateral (Patient's illness: context, course, modifying factors, severity, cultural, family, social, medical history):   Pt was on time to the appointment  Continuing to process distrust and concern about therapy & therapist and ways of working; mentioning that is unable to raise a topic w/therapist because feel there's no way the therapist could bring her physiological arousal down and thus raising difficult topic would be too triggering  Explaining more about her hopes for working with auxiliary trauma therapist in the summer; hoping this work could attend to the physiological arousal    OBJECTIVE  DATA REVIEWED (Consider medical labs, radiology, other medical tests; screening/outcome measures; psychological testing; discussion of test results with other clinicians; consultation with other clinicians and systems involved with patient, summary of old records): [place] (make clear medications prescribed by psychiatry; include OTC medications):    Current Outpatient Prescriptions:  citalopram (CELEXA) 10 MG  tablet Take 1 tablet by mouth daily 2 tabs daily. For: depression and anxiety. Disp: 60 tablet Rfl: 0   propranolol (INDERAL) 10 MG tablet Take 1 tablet by mouth 3 (three) times daily as needed For rapid heart rate or anxiety symptoms. Disp: 90 tablet Rfl: 3   THEANINE PO Take  by mouth. Disp:  Rfl:    Multiple Vitamin (MULTIVITAMINS PO)  Disp:  Rfl:      No current facility-administered medications for this visit.     MEDICATION ADHERENCE (including barriers and how addressed): yes     MEDICATION SIDE EFFECTS (Prescribers Only): N/A    BIRTH CONTROL (ask females and males): N/A    CURRENT PREGNANCY: N/A      MENTAL STATUS EXAMINATION                     General Appearance: Groomed white woman wearing weather appropriate clothing, graying medium-length hair     Interaction with Interviewer (eye contact, attitude, behavior): Fair to good eye contact, more dissatisfaction shown    Physical Signs    Gait and Station (how patient walks and stands): Within normal limits                  Physical Appearance: Within normal limits          Normal Movements: Within normal limits         Speech (rate, volume, articulation): Within normal limits          Language: Within normal limits                       Mood: "my heart is hammering"           Affect: Congruous with content, variable    Thought Process  Rate; concrete vs abstract reasoning: Within normal limits        Logical vs illogical; associations: loose, tangential, circumstantial, intact: Pt notes she is/can be mildly tangential at times                                    Thought Content    Normal Thought Content (other than safety): Within normal limits     Perceptions (auditory, visual, tactile, etc.): Within normal limits       Impulse Control: Within normal limits         Cognition (Link to [facility])    Orientation (person, place, time): Within normal limits      Recent and remote memory: Within normal limits           Attention span and concentration: Within  normal limits         Fund of knowledge, awareness of current events and vocabulary: Not formally assessed; appear intact      Judgment: Within normal limits          Insight: Moderate to good        Suicidality/Homicidality/Aggression (Victimization or Perpetration): denies/denies/denies     ASSESSMENT   Today's Assessment:  Pt stable, continuing to process disappointment in and anger at provider     Risk Level Assessment  Risk Level Change (if yes, please describe): No    Suicide: low (1)  Violence: low (1)  Addiction: low (1)          DIAGNOSES ASSESSED TODAY (psychiatric diagnoses and medical diagnoses that factor into management of psychiatric treatment): Dissociative disorder NOS; MDD, recurrent, moderate; PTSD by hx    CLINICAL FORMULATION (Make changes as your understanding changes. 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 Pt's strengths include intelligence, sense of humor, perseverence.    REVIEWING TODAY'S VISIT  CLINICAL INTERVENTIONS TODAY:   Gathered information  Offered support  Assessed sx and risk, especially suicidality  Discussed above topics  Discussed therapy ruptures and repairs  Explored methods/interventions that work and don't feel right for pt  Explored and helped pt express emotions that are difficult to access in order to help pt to metabolize and build connections  hampered by early trauma  Discussed uses of both intellectual and emotional working through of trauma and attachment rupture  Discussed auxiliary tx w/ pt's outside trauma therapist    PATIENT'S RESPONSE TO INTERVENTIONS: Pt was engaged and responsive    PROGRESS TOWARDS GOALS: some    TIME SPENT IN PSYCHOTHERAPY: 45 min    [place] (Consider risk plan for patients at moderate or high risk for suicide/violence/addiction; medication plan; referrals, etc. Must coincide with treatment plan.): N/A    PLAN FOR ONGOING TREATMENT:    1. Continue tx 2x/week to stabilize pt's improvement  2. Referral to [facility] day tx program if sx increase/if appropriate  3. Referral to neurofeedback tx if available at [facility]  if/when appropriate  4. Consider further group tx or auxiliary tx as appropriate  5. Encourage pt to re-engage in psychopharm consultation as needed  6. Consult with prescriber and PCP as needed     INFORMED CONSENT (for any new treatment): Patient was informed of the potential risks and benefits of the treatment, including the option not to treat, and appeared to understand and agreed to comply. Discussion included the following key points: N/A    [name], PhD, LICSW

## 2016-01-31 ENCOUNTER — Ambulatory Visit (HOSPITAL_BASED_OUTPATIENT_CLINIC_OR_DEPARTMENT_OTHER): Payer: MEDICARE | Admitting: Social Worker

## 2016-01-31 DIAGNOSIS — F331 Major depressive disorder, recurrent, moderate: Secondary | ICD-10-CM

## 2016-01-31 DIAGNOSIS — F449 Dissociative and conversion disorder, unspecified: Principal | ICD-10-CM

## 2016-01-31 NOTE — Progress Notes (Signed)
Marland KitchenOUTPATIENT PSYCHIATRY PROGRESS NOTE    INTERPRETER: No    CONTACT INFO FOR OTHER AGENCIES AND MENTAL HEALTH PROVIDERS (IF APPLICABLE): [name]; outside trauma-specialist therapist [name], Psy.D, [phone]; former group therapist [name]     PROBLEM(S) ADDRESSED IN THIS SESSION:   1. Level of functioning, flashbacks, dissociative experiences and behaviors  2. Difficulty brining certain topics to therapy  3. Working with transference/countertransference re: recurring relational patterns/enactments  4. Working on Runner, broadcasting/film/video between thinking/feeling/experiencing       [place] PATIENT'S WORDS:  1) Chief Complaint (Patient and/or guardian's own words, concerns and expressed thoughts): "I think disorganized attachment explains a lot about what I've been going through"    2) New information from patient and/or collateral (Patient's illness: context, course, modifying factors, severity, cultural, family, social, medical history):   Pt was on time to the appointment  Continuing on topic of recent rupture & repairing; talking about ways in which communication has broken down /gone awry lately in the therapy room  Identifying ways in which disorganized attachment as well as not understanding the "bizarre' behavior of adults around when growing up has affected pt    OBJECTIVE  DATA REVIEWED (Consider medical labs, radiology, other medical tests; screening/outcome measures; psychological testing; discussion of test results with other clinicians; consultation with other clinicians and systems involved with patient, summary of old records): Consulted EMR    CURRENT MEDICATIONS (make clear medications prescribed by psychiatry; include OTC medications):    Current Outpatient Prescriptions:  citalopram (CELEXA) 10 MG tablet Take 1 tablet by mouth daily 2 tabs daily. For: depression and anxiety. Disp: 60 tablet Rfl: 0    propranolol (INDERAL) 10 MG tablet Take 1 tablet by mouth 3 (three) times daily as needed For rapid heart rate or anxiety symptoms. Disp: 90 tablet Rfl: 3   THEANINE PO Take  by mouth. Disp:  Rfl:    Multiple Vitamin (MULTIVITAMINS PO)  Disp:  Rfl:      No current facility-administered medications for this visit.     MEDICATION ADHERENCE (including barriers and how addressed): yes     MEDICATION SIDE EFFECTS (Prescribers Only): N/A    BIRTH CONTROL (ask females and males): N/A    CURRENT PREGNANCY: N/A      MENTAL STATUS EXAMINATION                     General Appearance: Groomed white woman wearing weather appropriate clothing, graying medium-length hair     Interaction with Interviewer (eye contact, attitude, behavior): Fair to good eye contact, both critical and appreciative    Physical Signs    Gait and Station (how patient walks and stands): Within normal limits                  Physical Appearance: Within normal limits          Normal Movements: Within normal limits         Speech (rate, volume, articulation): Within normal limits          Language: Within normal limits                       Mood: "confused"           Affect: Congruous with content, variable    Thought Process     Rate; concrete vs abstract reasoning: Within normal limits  Logical vs illogical; associations: loose, tangential, circumstantial, intact: Pt notes she is/can be mildly tangential at times                                    Thought Content    Normal Thought Content (other than safety): Within normal limits     Perceptions (auditory, visual, tactile, etc.): Within normal limits       Impulse Control: Within normal limits         Cognition (Link to [facility])    Orientation (person, place, time): Within normal limits      Recent and remote memory: Within normal limits           Attention span and concentration: Within normal limits         Fund of knowledge, awareness of current events and vocabulary: Not formally assessed; appear  intact      Judgment: Within normal limits          Insight: Moderate to good        Suicidality/Homicidality/Aggression (Victimization or Perpetration): denies/denies/denies     ASSESSMENT   Today's Assessment:  Pt stable, moving towards repair after recent rupture     Risk Level Assessment  Risk Level Change (if yes, please describe): No    Suicide: low (1)  Violence: low (1)  Addiction: low (1)          DIAGNOSES ASSESSED TODAY (psychiatric diagnoses and medical diagnoses that factor into management of psychiatric treatment): Dissociative disorder NOS; MDD, recurrent, moderate; PTSD by hx    CLINICAL FORMULATION (Make changes as your understanding changes. 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 Pt's strengths include intelligence, sense of humor, perseverence.    REVIEWING TODAY'S VISIT  CLINICAL INTERVENTIONS TODAY:   Gathered information  Offered support  Assessed sx and risk, especially suicidality  Discussed above topics  Discussed therapy ruptures and repairs  Explored methods/interventions that work and don't feel right for pt  Explored and helped pt express emotions that are difficult to access in order to help pt to metabolize and build connections hampered by early trauma  Discussed uses of both intellectual and emotional working through of trauma and attachment rupture  Discussed  auxiliary tx w/ pt's outside trauma therapist    PATIENT'S RESPONSE TO INTERVENTIONS: Pt was engaged and responsive    PROGRESS TOWARDS GOALS: moderate    TIME SPENT IN PSYCHOTHERAPY: 45 min    PLAN  PLAN FOR MANAGING RISK (Consider risk plan for patients at moderate or high risk for suicide/violence/addiction; medication plan; referrals, etc. Must coincide with treatment plan.): N/A    PLAN FOR ONGOING TREATMENT:    1. Continue tx 2x/week to stabilize pt's improvement  2. Referral to [facility] day tx program if sx increase/if appropriate  3. Referral to neurofeedback tx if available at [facility] if/when appropriate  4. Consider further group tx or auxiliary tx as appropriate  5. Encourage pt to  re-engage in psychopharm consultation as needed  6. Consult with prescriber and PCP as needed     INFORMED CONSENT (for any new treatment): Patient was informed of the potential risks and benefits of the treatment, including the option not to treat, and appeared to understand and agreed to comply. Discussion included the following key points: N/A    [name], PhD, LICSW

## 2016-02-05 ENCOUNTER — Ambulatory Visit (HOSPITAL_BASED_OUTPATIENT_CLINIC_OR_DEPARTMENT_OTHER): Payer: MEDICARE | Admitting: Social Worker

## 2016-02-05 DIAGNOSIS — F331 Major depressive disorder, recurrent, moderate: Secondary | ICD-10-CM

## 2016-02-05 DIAGNOSIS — F449 Dissociative and conversion disorder, unspecified: Principal | ICD-10-CM

## 2016-02-05 NOTE — Progress Notes (Signed)
..UTPATIENT PSYCHIATRY PROGRESS NOTE    INTERPRETER: No    CONTACT INFO FOR OTHER AGENCIES AND MENTAL HEALTH PROVIDERS (IF APPLICABLE): [name]; outside trauma-specialist therapist [name], Psy.D, [phone]; former group therapist [name]     PROBLEM(S) ADDRESSED IN THIS SESSION:   1. Level of functioning, flashbacks, dissociative experiences and behaviors  2. Recent rupture/repair in therapy  3. Working with transference/countertransference re: recurring relational patterns/enactments  4. Working on Runner, broadcasting/film/video between thinking/feeling/experiencing       [place] PATIENT'S WORDS:  1) Chief Complaint (Patient and/or guardian's own words, concerns and expressed thoughts): "I felt encouraged after last Thursday"    2) New information from patient and/or collateral (Patient's illness: context, course, modifying factors, severity, cultural, family, social, medical history):   Pt was on time to the appointment  Continuing on topic of recent rupture & repairing; discussing pt's difficulty expressing agency/irritation in some ways openly because what feels like a physiological (pre-thought/pre/verbal) shutdown  Pt's needs are to have a safe enough space in the room / an invitation to express herself; a soothing (but not too soon/dismissing) response from the therapist, and finally some form of mind/work from the therapist in trying to understand/metabolize pt's frustration/anger    OBJECTIVE  DATA REVIEWED (Consider medical labs, radiology, other medical tests; screening/outcome measures; psychological testing; discussion of test results with other clinicians; consultation with other clinicians and systems involved with patient, summary of old records): Consulted EMR    CURRENT MEDICATIONS (make clear medications prescribed by psychiatry; include OTC medications):    Current Outpatient Prescriptions:  citalopram  (CELEXA) 10 MG tablet Take 1 tablet by mouth daily 2 tabs daily. For: depression and anxiety. Disp: 60 tablet Rfl: 0   propranolol (INDERAL) 10 MG tablet Take 1 tablet by mouth 3 (three) times daily as needed For rapid heart rate or anxiety symptoms. Disp: 90 tablet Rfl: 3   THEANINE PO Take  by mouth. Disp:  Rfl:    Multiple Vitamin (MULTIVITAMINS PO)  Disp:  Rfl:      No current facility-administered medications for this visit.     MEDICATION ADHERENCE (including barriers and how addressed): yes     MEDICATION SIDE EFFECTS (Prescribers Only): N/A    BIRTH CONTROL (ask females and males): N/A    CURRENT PREGNANCY: N/A      MENTAL STATUS EXAMINATION                     General Appearance: Groomed white woman wearing weather appropriate clothing, graying medium-length hair     Interaction with Interviewer (eye contact, attitude, behavior): Fair to good eye contact, both critical and appreciative    Physical Signs    Gait and Station (how patient walks and stands): Within normal limits                  Physical Appearance: Within normal limits          Normal Movements: Within normal limits         Speech (rate, volume, articulation): Within normal limits          Language: Within normal limits                       Mood: "more encouraged"           Affect: Congruous with content, variable    Thought Process     Rate; concrete  vs abstract reasoning: Within normal limits        Logical vs illogical; associations: loose, tangential, circumstantial, intact: Pt notes she is/can be mildly tangential at times                                    Thought Content    Normal Thought Content (other than safety): Within normal limits     Perceptions (auditory, visual, tactile, etc.): Within normal limits       Impulse Control: Within normal limits         Cognition (Link to [facility])    Orientation (person, place, time): Within normal limits      Recent and remote memory: Within normal limits           Attention span and concentration:  Within normal limits         Fund of knowledge, awareness of current events and vocabulary: Not formally assessed; appear intact      Judgment: Within normal limits          Insight: Moderate to good        Suicidality/Homicidality/Aggression (Victimization or Perpetration): denies/denies/denies     ASSESSMENT   Today's Assessment:  Pt stable, continuing to work on Office manager after recent rupture in tx    Risk Level Assessment  Risk Level Change (if yes, please describe): No    Suicide: low (1)  Violence: low (1)  Addiction: low (1)          DIAGNOSES ASSESSED TODAY (psychiatric diagnoses and medical diagnoses that factor into management of psychiatric treatment): Dissociative disorder NOS; MDD, recurrent, moderate; PTSD by hx    CLINICAL FORMULATION (Make changes as your understanding changes. 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 Pt's strengths include intelligence, sense of humor, perseverence.    REVIEWING TODAY'S VISIT  CLINICAL INTERVENTIONS TODAY:   Gathered information  Offered support  Assessed sx and risk, especially suicidality  Discussed above topics  Discussed therapy ruptures and repairs  Explored methods/interventions that work and don't feel right for pt  Explored and helped pt express emotions that are difficult to access in order to help pt to metabolize and build connections  hampered by early trauma  Discussed uses of both intellectual and emotional working through of trauma and attachment rupture  Discussed auxiliary tx w/ pt's outside trauma therapist    PATIENT'S RESPONSE TO INTERVENTIONS: Pt was engaged and responsive    PROGRESS TOWARDS GOALS: moderate    TIME SPENT IN PSYCHOTHERAPY: 45 min    PLAN  PLAN FOR MANAGING RISK (Consider risk plan for patients at moderate or high risk for suicide/violence/addiction; medication plan; referrals, etc. Must coincide with treatment plan.): N/A    PLAN FOR ONGOING TREATMENT:    1. Continue tx 2x/week to stabilize pt's improvement  2. Referral to [facility] day tx program if sx increase/if appropriate  3. Referral to neurofeedback tx if available at [place] appropriate  4. Consider further group tx or auxiliary tx as appropriate  5. Encourage pt to re-engage in psychopharm consultation as needed  6. Consult with prescriber and PCP as needed     INFORMED CONSENT (for any new treatment): Patient was informed of the potential risks and benefits of the treatment, including the option not to treat, and appeared to understand and agreed to comply. Discussion included the following key points: N/A    [name], PhD, LICSW

## 2016-02-07 ENCOUNTER — Ambulatory Visit (HOSPITAL_BASED_OUTPATIENT_CLINIC_OR_DEPARTMENT_OTHER): Payer: MEDICARE | Admitting: Social Worker

## 2016-02-07 DIAGNOSIS — F331 Major depressive disorder, recurrent, moderate: Secondary | ICD-10-CM

## 2016-02-07 DIAGNOSIS — F449 Dissociative and conversion disorder, unspecified: Principal | ICD-10-CM

## 2016-02-07 NOTE — Progress Notes (Signed)
Marland KitchenOUTPATIENT PSYCHIATRY PROGRESS NOTE    INTERPRETER: No    CONTACT INFO FOR OTHER AGENCIES AND MENTAL HEALTH PROVIDERS (IF APPLICABLE): [name]; outside trauma-specialist therapist [name], Psy.D, [phone]; former group therapist [name]     PROBLEM(S) ADDRESSED IN THIS SESSION:   1. Level of functioning, flashbacks, dissociative experiences and behaviors  2. Recent rupture/repair in therapy  3. Working with transference/countertransference re: recurring relational patterns/enactments  4. Working on Runner, broadcasting/film/video between thinking/feeling/experiencing       [place]:  1) Chief Complaint (Patient and/or guardian's own words, concerns and expressed thoughts): "I think I need things spelled out for me, I am concrete in that way"    2) New information from patient and/or collateral (Patient's illness: context, course, modifying factors, severity, cultural, family, social, medical history):   Pt was a few minutes late to the appointment  Continuing to examine recent interactions that have baffled ct and slow down and notice/look at the points where communication with others/interpreting messages from other goes awry  Discussed therapist's communication w/outside trauma specialist Cristal Ford and adjunctive tx this summer (2-3 times, according to dr. Chestine Spore availability    OBJECTIVE  DATA REVIEWED (Consider medical labs, radiology, other medical tests; screening/outcome measures; psychological testing; discussion of test results with other clinicians; consultation with other clinicians and systems involved with patient, summary of old records): March ARB (make clear medications prescribed by psychiatry; include OTC medications):    Current Outpatient Prescriptions:  citalopram (CELEXA) 10 MG tablet Take 1 tablet by mouth daily 2 tabs daily. For: depression and anxiety. Disp:  60 tablet Rfl: 0   propranolol (INDERAL) 10 MG tablet Take 1 tablet by mouth 3 (three) times daily as needed For rapid heart rate or anxiety symptoms. Disp: 90 tablet Rfl: 3   THEANINE PO Take  by mouth. Disp:  Rfl:    Multiple Vitamin (MULTIVITAMINS PO)  Disp:  Rfl:      No current facility-administered medications for this visit.     MEDICATION ADHERENCE (including barriers and how addressed): yes     MEDICATION SIDE EFFECTS (Prescribers Only): N/A    BIRTH CONTROL (ask females and males): N/A    CURRENT PREGNANCY: N/A      MENTAL STATUS EXAMINATION                     General Appearance: Groomed white woman wearing weather appropriate clothing, graying medium-length hair     Interaction with Interviewer (eye contact, attitude, behavior): Fair to good eye contact, more collaborative/less critical than in the past weeks    Physical Signs    Gait and Station (how patient walks and stands): Within normal limits                  Physical Appearance: Within normal limits          Normal Movements: Within normal limits         Speech (rate, volume, articulation): Within normal limits          Language: Within normal limits                       Mood: "confused"           Affect: Congruous with content, variable    Thought Process     Rate; concrete vs abstract reasoning: Within normal limits  Logical vs illogical; associations: loose, tangential, circumstantial, intact: Pt notes she is/can be mildly tangential at times                                    Thought Content    Normal Thought Content (other than safety): Within normal limits     Perceptions (auditory, visual, tactile, etc.): Within normal limits       Impulse Control: Within normal limits         Cognition (Link to [facility])    Orientation (person, place, time): Within normal limits      Recent and remote memory: Within normal limits           Attention span and concentration: Within normal limits         Fund of knowledge, awareness of current events and  vocabulary: Not formally assessed; appear intact      Judgment: Within normal limits          Insight: Moderate to good        Suicidality/Homicidality/Aggression (Victimization or Perpetration): denies/denies/denies     ASSESSMENT   Today's Assessment:  Pt stable, regaining trust in tx and discussing adjunctive more trauma and the body-focused tx    Risk Level Assessment  Risk Level Change (if yes, please describe): No    Suicide: low (1)  Violence: low (1)  Addiction: low (1)          DIAGNOSES ASSESSED TODAY (psychiatric diagnoses and medical diagnoses that factor into management of psychiatric treatment): Dissociative disorder NOS; MDD, recurrent, moderate; PTSD by hx    CLINICAL FORMULATION (Make changes as your understanding changes. 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 Pt's strengths include intelligence, sense of humor, perseverence.    REVIEWING TODAY'S VISIT  CLINICAL INTERVENTIONS TODAY:   Gathered information  Offered support  Assessed sx and risk, especially suicidality  Discussed above topics  Discussed therapy ruptures and repairs  Explored methods/interventions that work and don't feel right for pt  Explored and helped pt express emotions that are difficult to access in order to help pt to metabolize and build connections hampered by early trauma  Discussed uses of both  intellectual and emotional working through of trauma and attachment rupture  Discussed auxiliary tx w/ pt's outside trauma therapist    PATIENT'S RESPONSE TO INTERVENTIONS: Pt was engaged and responsive    PROGRESS TOWARDS GOALS: moderate    TIME SPENT IN PSYCHOTHERAPY: 45 min    PLAN  PLAN FOR MANAGING RISK (Consider risk plan for patients at moderate or high risk for suicide/violence/addiction; medication plan; referrals, etc. Must coincide with treatment plan.): N/A    PLAN FOR ONGOING TREATMENT:    1. Continue tx 2x/week to stabilize pt's improvement  2. Referral to [facility] day tx program if sx increase/if appropriate  3. Referral to [facility]feedback tx if available at [facility] if/when appropriate  4. Consider further group tx or auxiliary tx as  appropriate  5. Encourage pt to re-engage in psychopharm consultation as needed  6. Consult with prescriber and PCP as needed     INFORMED CONSENT (for any new treatment): Patient was informed of the potential risks and benefits of the treatment, including the option not to treat, and appeared to understand and agreed to comply. Discussion included the following key points: N/A    [name], PhD, LICSW

## 2016-02-14 ENCOUNTER — Ambulatory Visit (HOSPITAL_BASED_OUTPATIENT_CLINIC_OR_DEPARTMENT_OTHER): Payer: MEDICARE | Admitting: Social Worker

## 2016-02-14 DIAGNOSIS — F331 Major depressive disorder, recurrent, moderate: Secondary | ICD-10-CM

## 2016-02-14 DIAGNOSIS — F449 Dissociative and conversion disorder, unspecified: Principal | ICD-10-CM

## 2016-02-14 NOTE — Progress Notes (Signed)
Marland KitchenOUTPATIENT PSYCHIATRY PROGRESS NOTE    INTERPRETER: No    CONTACT INFO FOR OTHER AGENCIES AND MENTAL HEALTH PROVIDERS (IF APPLICABLE): [name]; outside trauma-specialist therapist [name], Psy.D, [phone]; former group therapist [name]     PROBLEM(S) ADDRESSED IN THIS SESSION:   1. Level of functioning, flashbacks, dissociative experiences and behaviors  2. Daily routines and self-care  3. Working with transference/countertransference re: recurring relational patterns/enactments  4. Working on Runner, broadcasting/film/video between thinking/feeling/experiencing       [place] PATIENT'S WORDS:  1) Chief Complaint (Patient and/or guardian's own words, concerns and expressed thoughts): "There was a period in my life earlier when i didn't really eat"    2) New information from patient and/or collateral (Patient's illness: context, course, modifying factors, severity, cultural, family, social, medical history):   Pt was on time to the appointment  Will meet with outside trauma-focused therapist Dr. Alver Fisher 3 times in July; we discuss scheduling and possibility of Medicare not reimbursing 3x/week tx  More focus and information on everyday functioning and self-care; pt is cleaning and tidying her home and preparing for a presentation; cooks basic things for herself; goes to [employer]; takes walks when weather is nice  "it's not enough to fill your days, you also have to make connections and this is what my struggle is"    OBJECTIVE  DATA REVIEWED (Consider medical labs, radiology, other medical tests; screening/outcome measures; psychological testing; discussion of test results with other clinicians; consultation with other clinicians and systems involved with patient, summary of old records): Consulted EMR    CURRENT MEDICATIONS (make clear medications prescribed by psychiatry; include OTC medications):    Current Outpatient  Prescriptions:  citalopram (CELEXA) 10 MG tablet Take 1 tablet by mouth daily 2 tabs daily. For: depression and anxiety. Disp: 60 tablet Rfl: 0   propranolol (INDERAL) 10 MG tablet Take 1 tablet by mouth 3 (three) times daily as needed For rapid heart rate or anxiety symptoms. Disp: 90 tablet Rfl: 3   THEANINE PO Take  by mouth. Disp:  Rfl:    Multiple Vitamin (MULTIVITAMINS PO)  Disp:  Rfl:      No current facility-administered medications for this visit.     MEDICATION ADHERENCE (including barriers and how addressed): yes     MEDICATION SIDE EFFECTS (Prescribers Only): N/A    BIRTH CONTROL (ask females and males): N/A    CURRENT PREGNANCY: N/A      MENTAL STATUS EXAMINATION                     General Appearance: Groomed white woman wearing weather appropriate clothing, graying medium-length hair     Interaction with Interviewer (eye contact, attitude, behavior): Fair to good eye contact, collaborative; making a conscious effort to note things that provider says that pt feels are helpful    Physical Signs    Gait and Station (how patient walks and stands): Within normal limits                  Physical Appearance: Within normal limits          Normal Movements: Within normal limits         Speech (rate, volume, articulation): Within normal limits          Language: Within normal limits  Mood: "okay"           Affect: Congruous with content, variable    Thought Process     Rate; concrete vs abstract reasoning: Within normal limits        Logical vs illogical; associations: loose, tangential, circumstantial, intact: Pt notes she is/can be mildly tangential at times                                    Thought Content    Normal Thought Content (other than safety): Within normal limits     Perceptions (auditory, visual, tactile, etc.): Within normal limits       Impulse Control: Within normal limits         Cognition (Link to [facility])    Orientation (person, place, time): Within normal limits       Recent and remote memory: Within normal limits           Attention span and concentration: Within normal limits         Fund of knowledge, awareness of current events and vocabulary: Not formally assessed; appear intact      Judgment: Within normal limits          Insight: Moderate to good        Suicidality/Homicidality/Aggression (Victimization or Perpetration): denies/denies/denies     ASSESSMENT   Today's Assessment:  Pt stable, tolerated well a break in tx d/t 4th of July; looking forward to adjunctive trauma tx    Risk Level Assessment  Risk Level Change (if yes, please describe): No    Suicide: low (1)  Violence: low (1)  Addiction: low (1)          DIAGNOSES ASSESSED TODAY (psychiatric diagnoses and medical diagnoses that factor into management of psychiatric treatment): Dissociative disorder NOS; MDD, recurrent, moderate; PTSD by hx    CLINICAL FORMULATION (Make changes as your understanding changes. Should coincide with treatment plan): 71-yo white domiciled woman on disability for dissociative disorder, seeking continuation of psychotherapy after several briefer therapies (six providers in eight years.) Pt identifies herself as suffering from insecure/disorganized attachment based on early childhood events as well as a lack of "self/observing ego" or alexithymia/severe deficits in connecting to her feelings, and is looking for a somewhat relational, transference/countertransference- based therapy where therapist could model connecting emotions and words, containing and metabolizing difficult affect, and help pt connect her 'adult' self to her affective states. Pt's strengths include intelligence, sense of humor, perseverence.    REVIEWING TODAY'S VISIT  CLINICAL INTERVENTIONS TODAY:   Gathered information  Offered support  Assessed sx and risk, especially suicidality  Discussed above topics  Discussed therapy ruptures and repairs  Explored methods/interventions that work and don't feel right for  pt  Explored and helped pt express emotions that are difficult to access in order to help pt to metabolize and build connections hampered by early trauma  Discussed uses of both intellectual and emotional working through of trauma and attachment rupture  Discussed auxiliary tx w/ pt's outside trauma therapist  Discussed and examined self-care routines and concrete management of daily life    PATIENT'S RESPONSE TO INTERVENTIONS: Pt was engaged and responsive    PROGRESS TOWARDS GOALS: moderate    TIME SPENT IN PSYCHOTHERAPY: 45 min    PLAN  PLAN FOR MANAGING RISK (Consider risk plan for patients at moderate or high risk for suicide/violence/addiction; medication plan; referrals, etc. Must coincide  with treatment plan.): N/A    PLAN FOR ONGOING TREATMENT:    1. Continue tx 2x/week to stabilize pt's improvement  2. Referral to [facility] day tx program if sx increase/if appropriate  3. Referral to neurofeedback tx if available at [facility] if/when appropriate  4. Consider further group tx or auxiliary tx as appropriate  5. Encourage pt to re-engage in psychopharm consultation as needed  6. Consult with prescriber and PCP as needed     INFORMED CONSENT (for any new treatment): Patient was informed of the potential risks and benefits of the treatment, including the option not to treat, and appeared to understand and agreed to comply. Discussion included the following key points: N/A    [name], PhD, LICSW

## 2016-02-18 ENCOUNTER — Ambulatory Visit (HOSPITAL_BASED_OUTPATIENT_CLINIC_OR_DEPARTMENT_OTHER): Payer: MEDICARE | Admitting: Internal Medicine

## 2016-02-18 ENCOUNTER — Encounter (HOSPITAL_BASED_OUTPATIENT_CLINIC_OR_DEPARTMENT_OTHER): Payer: Self-pay | Admitting: Internal Medicine

## 2016-02-18 VITALS — BP 100/64 | HR 94 | Temp 99.3°F | Wt 112.0 lb

## 2016-02-18 DIAGNOSIS — M62830 Muscle spasm of back: Secondary | ICD-10-CM

## 2016-02-18 DIAGNOSIS — L659 Nonscarring hair loss, unspecified: Secondary | ICD-10-CM

## 2016-02-18 DIAGNOSIS — L918 Other hypertrophic disorders of the skin: Secondary | ICD-10-CM

## 2016-02-18 DIAGNOSIS — L853 Xerosis cutis: Secondary | ICD-10-CM

## 2016-02-18 NOTE — Progress Notes (Signed)
[name] is a 60 year old patient  Here today re:   She was concerned re: can feel tiny bumps on L side of neck (skin), no pain. No itch, understands these to be skin tags, wonders if this is a sign of disease.  On: - L side neck   And: taking:  tocotrienol.  "Vitamin E is an essential nutrient that contains tocotrienols and tocopherols. Tocotrienols are often found in natural sources such as palm or rice brain oil.  People take tocotrienols by mouth for aging, Alzheimer's disease, clogged arteries, cancer, diabetes, a genetic disorder called familial dysautonomia, high cholesterol, kidney failure, and stroke.   Tocotrienols might lower cholesterol levels and provide heart health benefits."    & reports:  -a cut inner R thumb which healed w keratinized skin - is this a sign of illness?  And: noticed an area upper L side of back some discomfort - since: maybe: spring.  She can identify no precipitants. She can't identify anything which alleviates it or exacerbates it. Has tried no remedies so far.   C/o lately has been seeing more hair in tub drain / comb, and started taking turmeric for this.    ROS otherwise negative for psychiatric, cardiac, pulmonary, gastrointestinal, genitourinary, musculoskeletal or neurological symptoms.    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  Previously in [place].  Lives alone.  Single since '05.  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        Smoking status: Never Smoker                                                              Alcohol use: No                 Current Outpatient Prescriptions:  citalopram (CELEXA) 10 MG tablet Take 1 tablet by mouth daily 2 tabs daily. For: depression and anxiety. Disp: 60 tablet D/c'd post 2 weeks, did not feel well on.   propranolol (INDERAL) 10 MG tablet Take 1 tablet by mouth 3 (three) times daily as needed For rapid heart rate or anxiety symptoms. Disp: 90 tablet Rfl: 3   THEANINE PO Take  by mouth. Disp:  Rfl:    Multiple Vitamin (MULTIVITAMINS PO)  Disp:  Rfl:      No current facility-administered medications for this visit.     Taking: turmeric/ curcumin  - also B-vitamins.   For: low motivation; started in March.    Reports no difficulties with meds, co-pays or insurance  Payor: MEDICARE / Plan: MEDICARE / Product Type: *No Product type* /  Allergy History: Penicillins    Health maintenance: due for: ZOSTER VACCINE,AGE 67 AND OLDER (ONCE) due on 07/03/2014      PHYSICAL EXAMINATION:  BP 100/64  Pulse 94  Temp 99.3 F (37.4 C) (Temporal)  Wt 50.8 kg (112 lb)  SpO2 97%  BMI 19.97 kg/m2   Comfortable-appearing; alert, ambulatory, attentive.  Moves easily; communication unimpaired.   Affect appropriate to situation.    Cognitive status intact to history.  Derm:  A few <1-mm skin tags L side neck.  No acanthosis nigricans.  Inner aspect R thumb, 6-mm keratinized or dry skin patch, cut has healed, appearance is within normal.  Scalp exam, normal, without evident alopecia.  Skin otherwise unremarkable, warm and dry, no rash  Back: Cervical, thoracic and lumbar spine exam is normal without tenderness, masses or kyphoscoliosis. Full range of motion without pain is noted.  There is slight tenderness to palpation medial to L scapula, with reproduceable tenderness; FROM L arm/shoulder / nl -   And chest is CTA.    Most Recent Weight Reading(s)  02/18/16 : 50.8 kg (112 lb)  08/14/15 : 53.1 kg (117 lb)  04/11/15 :  52.2 kg (115 lb)    ASSESSMENT/PLAN:  1. Cutaneous skin tags  Reassured.  Too small for tx.    2. Dry skin  rvwd dry skin care.    3. Hair thinning  rvwd telogen effluvium, normal age-related changes.    4. Back muscle spasm  Discussed: what myofascial pain is. Explained that her muscles are tight, and they hurt.  No evidence of damaged bones or organs.  Reviewed things that have worked for other people with similar pain: stretching, massage, relaxation, yoga, exercise.  Understands to return if no improvement or if new symptoms.          Counselling-oriented visit, 15 minutes spent face-to-face with patient, of which greater than 50% was direct counselling; topics covered included discussing diagnoses, reviewing typical symptoms and  approaches to workup, and options for management/treatment.      We discussed her med(s) and the importance of medication compliance. The patient was ready to learn and no apparent learning barriers were identified. I explained the diagnosis and treatment plan, and the patient expressed understanding of the content. Possible side effects of the prescribed medication(s) were explained.  I attempted to answer any questions regarding the diagnosis and the proposed treatment.      1. The patient indicates understanding of these issues and agrees with the plan.  2. Past medical history, medications, problem list, allergies, family history, health habits, social history: reviewed, updated, as noted in EPIC.   3. I reconciled the patient's medication list and prepared and supplied needed refills.    Electronically signed by: Blima Singer MD, 02/18/2016 10:14 AM

## 2016-02-19 ENCOUNTER — Ambulatory Visit (HOSPITAL_BASED_OUTPATIENT_CLINIC_OR_DEPARTMENT_OTHER): Payer: MEDICARE | Admitting: Social Worker

## 2016-02-19 DIAGNOSIS — F449 Dissociative and conversion disorder, unspecified: Secondary | ICD-10-CM

## 2016-02-19 DIAGNOSIS — F331 Major depressive disorder, recurrent, moderate: Secondary | ICD-10-CM

## 2016-02-19 NOTE — Progress Notes (Signed)
Marland KitchenOUTPATIENT PSYCHIATRY PROGRESS NOTE    INTERPRETER: No    CONTACT INFO FOR OTHER AGENCIES AND MENTAL HEALTH PROVIDERS (IF APPLICABLE): [name]; outside trauma-specialist therapist [name], Psy.D, [phone]; former group therapist [name]     PROBLEM(S) ADDRESSED IN THIS SESSION:   1. Level of functioning, flashbacks, dissociative experiences and behaviors  2. relational  3. Working with transference/countertransference re: recurring relational patterns/enactments  4. Working on Runner, broadcasting/film/video between thinking/feeling/experiencing       [place] PATIENT'S WORDS:  1) Chief Complaint (Patient and/or guardian's own words, concerns and expressed thoughts): "I need to learn and practice compartmentalization (in a non-dissociative way)"    2) New information from patient and/or collateral (Patient's illness: context, course, modifying factors, severity, cultural, family, social, medical history):   Pt was a few minutes late to the appointment  Looking forward to seeing trauma therapist for aux tx; seems in somewhat better spirits - noting how helpful she felt meeting three times/week was earlier in the Spring when she was experiencing more severe distress and now thinks provider's willingness to extend herself was important for development of effective tx relationship  Pt asking for help and feedback from therapist re: recent emails and her interpretation of them  Says a task she'd like to accomplish through therapy is increased ability to use compartmentalization as a defense (vs. dissociation)    OBJECTIVE  DATA REVIEWED (Consider medical labs, radiology, other medical tests; screening/outcome measures; psychological testing; discussion of test results with other clinicians; consultation with other clinicians and systems involved with patient, summary of old records): Consulted EMR    CURRENT MEDICATIONS (make clear  medications prescribed by psychiatry; include OTC medications):    Current Outpatient Prescriptions:  propranolol (INDERAL) 10 MG tablet Take 1 tablet by mouth 3 (three) times daily as needed For rapid heart rate or anxiety symptoms. Disp: 90 tablet Rfl: 3   THEANINE PO Take  by mouth. Disp:  Rfl:    Multiple Vitamin (MULTIVITAMINS PO)  Disp:  Rfl:      No current facility-administered medications for this visit.     MEDICATION ADHERENCE (including barriers and how addressed): yes     MEDICATION SIDE EFFECTS (Prescribers Only): N/A    BIRTH CONTROL (ask females and males): N/A    CURRENT PREGNANCY: N/A      MENTAL STATUS EXAMINATION                     General Appearance: Groomed white woman wearing weather appropriate clothing, graying medium-length hair     Interaction with Interviewer (eye contact, attitude, behavior): Fair to good eye contact, collaborative; friendly, making a conscious effort to note things that provider says that pt feels are helpful    Physical Signs    Gait and Station (how patient walks and stands): Within normal limits                  Physical Appearance: Within normal limits          Normal Movements: Within normal limits         Speech (rate, volume, articulation): Within normal limits          Language: Within normal limits                       Mood: "all right"           Affect: Congruous with content,  appropriate to content    Thought Process     Rate; concrete vs abstract reasoning: Within normal limits        Logical vs illogical; associations: loose, tangential, circumstantial, intact: Pt notes she is/can be mildly tangential at times                                    Thought Content    Normal Thought Content (other than safety): Within normal limits     Perceptions (auditory, visual, tactile, etc.): Within normal limits       Impulse Control: Within normal limits         Cognition (Link to [facility])    Orientation (person, place, time): Within normal limits      Recent and remote  memory: Within normal limits           Attention span and concentration: Within normal limits         Fund of knowledge, awareness of current events and vocabulary: Not formally assessed; appear intact      Judgment: Within normal limits          Insight: Moderate to good        Suicidality/Homicidality/Aggression (Victimization or Perpetration): denies/denies/denies     ASSESSMENT   Today's Assessment:  Pt stable, mood seems improved; possibly as result of fairly successful (for now) repair of therapy rupture; continues to be more active    Risk Level Assessment  Risk Level Change (if yes, please describe): No    Suicide: low (1)  Violence: low (1)  Addiction: low (1)          DIAGNOSES ASSESSED TODAY (psychiatric diagnoses and medical diagnoses that factor into management of psychiatric treatment): Dissociative disorder NOS; MDD, recurrent, moderate; PTSD by hx    CLINICAL FORMULATION (Make changes as your understanding changes. Should coincide with treatment plan): 71-yo white domiciled woman on disability for dissociative disorder, seeking continuation of psychotherapy after several briefer therapies (six providers in eight years.) Pt identifies herself as suffering from insecure/disorganized attachment based on early childhood events as well as a lack of "self/observing ego" or alexithymia/severe deficits in connecting to her feelings, and is looking for a somewhat relational, transference/countertransference- based therapy where therapist could model connecting emotions and words, containing and metabolizing difficult affect, and help pt connect her 'adult' self to her affective states. Pt's strengths include intelligence, sense of humor, perseverence.    REVIEWING TODAY'S VISIT  CLINICAL INTERVENTIONS TODAY:   Gathered information  Offered support  Assessed sx and risk, especially suicidality  Discussed above topics  Discussed therapy ruptures and repairs  Explored methods/interventions that work and don't  feel right for pt  Explored and helped pt express emotions that are difficult to access in order to help pt to metabolize and build connections hampered by early trauma  Discussed uses of both intellectual and emotional working through of trauma and attachment rupture  Discussed auxiliary tx w/ pt's outside trauma therapist  Helped pt reality-test and examine her interpretations of recent interarctions with third parties    PATIENT'S RESPONSE TO INTERVENTIONS: Pt was engaged and responsive    PROGRESS TOWARDS GOALS: moderate    TIME SPENT IN PSYCHOTHERAPY: 45 min    PLAN  PLAN FOR MANAGING RISK (Consider risk plan for patients at moderate or high risk for suicide/violence/addiction; medication plan; referrals, etc. Must coincide with treatment plan.): N/A    PLAN  FOR ONGOING TREATMENT:    1. Continue tx 2x/week to stabilize pt's improvement  2. Referral to [facility] day tx program if sx increase/if appropriate  3. Referral to neurofeedback tx if available at [facility] if/when appropriate  4. Consider further group tx or auxiliary tx as appropriate  5. Encourage pt to re-engage in psychopharm consultation as needed  6. Consult with prescriber and PCP as needed     INFORMED CONSENT (for any new treatment): Patient was informed of the potential risks and benefits of the treatment, including the option not to treat, and appeared to understand and agreed to comply. Discussion included the following key points: N/A    [name], PhD, LICSW

## 2016-02-21 ENCOUNTER — Ambulatory Visit (HOSPITAL_BASED_OUTPATIENT_CLINIC_OR_DEPARTMENT_OTHER): Payer: MEDICARE | Admitting: Social Worker

## 2016-02-21 DIAGNOSIS — F449 Dissociative and conversion disorder, unspecified: Secondary | ICD-10-CM

## 2016-02-21 DIAGNOSIS — F331 Major depressive disorder, recurrent, moderate: Secondary | ICD-10-CM

## 2016-02-21 NOTE — Progress Notes (Signed)
Marland KitchenOUTPATIENT PSYCHIATRY PROGRESS NOTE    INTERPRETER: No    CONTACT INFO FOR OTHER AGENCIES AND MENTAL HEALTH PROVIDERS (IF APPLICABLE): [name]; outside trauma-specialist therapist [name], Psy.D, [phone]; former group therapist [name]     PROBLEM(S) ADDRESSED IN THIS SESSION:   1. Level of functioning, flashbacks, dissociative experiences and behaviors  2. Effects of political climate on MH  3. Working with transference/countertransference re: recurring relational patterns/enactments  4. Working on Runner, broadcasting/film/video between thinking/feeling/experiencing       [place] PATIENT'S WORDS:  1) Chief Complaint (Patient and/or guardian's own words, concerns and expressed thoughts): "It helped me to realize half of the nation has been in shock and speechlessness"    2) New information from patient and/or collateral (Patient's illness: context, course, modifying factors, severity, cultural, family, social, medical history):   Pt was on time to the appointment  We start by discussing t/providers upcoming leaving [place] and possibility of continuing to work together in [employer] - pt would like to do this  Has thought of another thing that she believes has contributed to improved mood from January to now (in addition to more light, using curcumin supplements, psychopharm appt, t/provider's 'effort and willingness' in seeing her 3x/week when things were at their most distressing) - political climate after election last fall and adjusting to this, especially understanding that more people than just pt might be feeling similar feelings  Doesn't mention specifics about meeting w/trauma therapist but prefers to have appointments arranged on consecutive days  Able to bring up a non-emotionally charged example of feeling provider isn't meeting her where she's at     [place] (Consider medical  labs, radiology, other medical tests; screening/outcome measures; psychological testing; discussion of test results with other clinicians; consultation with other clinicians and systems involved with patient, summary of old records): Consulted EMR    CURRENT MEDICATIONS (make clear medications prescribed by psychiatry; include OTC medications):    Current Outpatient Prescriptions:  propranolol (INDERAL) 10 MG tablet Take 1 tablet by mouth 3 (three) times daily as needed For rapid heart rate or anxiety symptoms. Disp: 90 tablet Rfl: 3   THEANINE PO Take  by mouth. Disp:  Rfl:    Multiple Vitamin (MULTIVITAMINS PO)  Disp:  Rfl:      No current facility-administered medications for this visit.     MEDICATION ADHERENCE (including barriers and how addressed): yes     MEDICATION SIDE EFFECTS (Prescribers Only): N/A    BIRTH CONTROL (ask females and males): N/A    CURRENT PREGNANCY: N/A      MENTAL STATUS EXAMINATION                     General Appearance: Groomed white woman wearing weather appropriate clothing, graying medium-length hair     Interaction with Interviewer (eye contact, attitude, behavior): Fair to good eye contact, collaborative;     Physical Signs    Gait and Station (how patient walks and stands): Within normal limits                  Physical Appearance: Within normal limits          Normal Movements: Within normal limits         Speech (rate, volume, articulation): Within normal limits          Language: Within normal limits  Mood: "okay"           Affect: Congruous with content, appropriate to content    Thought Process     Rate; concrete vs abstract reasoning: Within normal limits        Logical vs illogical; associations: loose, tangential, circumstantial, intact: Pt notes she is/can be mildly tangential at times                                    Thought Content    Normal Thought Content (other than safety): Within normal limits     Perceptions (auditory, visual, tactile,  etc.): Within normal limits       Impulse Control: Within normal limits         Cognition (Link to [facility])    Orientation (person, place, time): Within normal limits      Recent and remote memory: Within normal limits           Attention span and concentration: Within normal limits         Fund of knowledge, awareness of current events and vocabulary: Not formally assessed; appear intact      Judgment: Within normal limits          Insight: Moderate to good        Suicidality/Homicidality/Aggression (Victimization or Perpetration): denies/denies/denies     ASSESSMENT   Today's Assessment:  Pt stable, able to discuss misattunement and minor ruptures in tx without feeling emotionally overwhelmed    Risk Level Assessment  Risk Level Change (if yes, please describe): No    Suicide: low (1)  Violence: low (1)  Addiction: low (1)          DIAGNOSES ASSESSED TODAY (psychiatric diagnoses and medical diagnoses that factor into management of psychiatric treatment): Dissociative disorder NOS; MDD, recurrent, moderate; PTSD by hx    CLINICAL FORMULATION (Make changes as your understanding changes. 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 Pt's strengths include intelligence, sense of humor, perseverence.    REVIEWING TODAY'S VISIT  CLINICAL INTERVENTIONS TODAY:   Gathered information  Offered  support  Assessed sx and risk, especially suicidality  Discussed above topics  Discussed therapy ruptures and repairs  Explored methods/interventions that work and don't feel right for pt  Explored and helped pt express emotions that are difficult to access in order to help pt to metabolize and build connections hampered by early trauma  Discussed uses of both intellectual and emotional working through of trauma and attachment rupture  Discussed auxiliary tx w/ pt's outside trauma therapist  Discussed t/provider's upcoming leaving [place] and possibility of continuing to work together outside [place]: Pt was engaged and responsive    PROGRESS TOWARDS GOALS: moderate    TIME SPENT IN PSYCHOTHERAPY: 45 min    [place] (Consider risk plan for patients at moderate or high risk for suicide/violence/addiction; medication plan; referrals, etc.  Must coincide with treatment plan.): N/A    PLAN FOR ONGOING TREATMENT:    1. Continue tx 2x/week to stabilize pt's improvement  2. Referral to [facility] day tx program if sx increase/if appropriate  3. Referral to neurofeedback tx if available at [facility] if/when appropriate  4. Consider further group tx or auxiliary tx as appropriate  5. Encourage pt to re-engage in psychopharm consultation as needed  6. Consult with prescriber and PCP as needed     INFORMED CONSENT (for any new treatment): Patient was informed of the potential risks and benefits of the treatment, including the option not to treat, and appeared to understand and agreed to comply. Discussion included the following key points: N/A    [name], PhD, LICSW

## 2016-02-26 ENCOUNTER — Ambulatory Visit (HOSPITAL_BASED_OUTPATIENT_CLINIC_OR_DEPARTMENT_OTHER): Payer: MEDICARE | Admitting: Social Worker

## 2016-02-26 DIAGNOSIS — F331 Major depressive disorder, recurrent, moderate: Secondary | ICD-10-CM

## 2016-02-26 DIAGNOSIS — F449 Dissociative and conversion disorder, unspecified: Secondary | ICD-10-CM

## 2016-02-26 NOTE — Progress Notes (Signed)
Marland KitchenOUTPATIENT PSYCHIATRY PROGRESS NOTE    INTERPRETER: No    CONTACT INFO FOR OTHER AGENCIES AND MENTAL HEALTH PROVIDERS (IF APPLICABLE): [name]; outside trauma-specialist therapist [name], Psy.D, [phone]; former group therapist [name]     PROBLEM(S) ADDRESSED IN THIS SESSION:   1. Level of functioning, flashbacks, dissociative experiences and behaviors  2. Loneliness  3. Working with transference/countertransference re: recurring relational patterns/enactments  4. Working on Runner, broadcasting/film/video between thinking/feeling/experiencing       [place] PATIENT'S WORDS:  1) Chief Complaint (Patient and/or guardian's own words, concerns and expressed thoughts): "I need the emotional sharing, but the second best is to tell me the therapist cannot for some reason join me or feel what I'm feeling right then"    2) New information from patient and/or collateral (Patient's illness: context, course, modifying factors, severity, cultural, family, social, medical history):   Pt was on time to the appointment  Processing emotional responses and 'overwhelm' in two events outside therapy; feels more connected to own feeling but then overwhelmed because feels lack of coping mechanisms in metabolizing/not getting overwhelmed by them  Discuss how auxiliary trauma tx is going and what benefit pt feels she's getting from it  Discuss pt's upcoming visit to friends, managing social connections while there, and break in tx    OBJECTIVE  DATA REVIEWED (Consider medical labs, radiology, other medical tests; screening/outcome measures; psychological testing; discussion of test results with other clinicians; consultation with other clinicians and systems involved with patient, summary of old records): Consulted EMR    CURRENT MEDICATIONS (make clear medications prescribed by psychiatry; include OTC medications):    Current Outpatient  Prescriptions:  propranolol (INDERAL) 10 MG tablet Take 1 tablet by mouth 3 (three) times daily as needed For rapid heart rate or anxiety symptoms. Disp: 90 tablet Rfl: 3   THEANINE PO Take  by mouth. Disp:  Rfl:    Multiple Vitamin (MULTIVITAMINS PO)  Disp:  Rfl:      No current facility-administered medications for this visit.     MEDICATION ADHERENCE (including barriers and how addressed): yes     MEDICATION SIDE EFFECTS (Prescribers Only): N/A    BIRTH CONTROL (ask females and males): N/A    CURRENT PREGNANCY: N/A      MENTAL STATUS EXAMINATION                     General Appearance: Groomed white woman wearing weather appropriate clothing, graying medium-length hair     Interaction with Interviewer (eye contact, attitude, behavior): Fair to good eye contact, collaborative; showing more vulnerable emotion     Physical Signs    Gait and Station (how patient walks and stands): Within normal limits                  Physical Appearance: Within normal limits          Normal Movements: Within normal limits         Speech (rate, volume, articulation): Within normal limits          Language: Within normal limits                       Mood: "emotional"           Affect: Congruous with content, appropriate to content    Thought Process     Rate; concrete vs abstract reasoning: Within normal limits  Logical vs illogical; associations: loose, tangential, circumstantial, intact: Pt notes she is/can be mildly tangential at times                                    Thought Content    Normal Thought Content (other than safety): Within normal limits     Perceptions (auditory, visual, tactile, etc.): Within normal limits       Impulse Control: Within normal limits         Cognition (Link to [facility])    Orientation (person, place, time): Within normal limits      Recent and remote memory: Within normal limits           Attention span and concentration: Within normal limits         Fund of knowledge, awareness of current events  and vocabulary: Not formally assessed; appear intact      Judgment: Within normal limits          Insight: Moderate to good        Suicidality/Homicidality/Aggression (Victimization or Perpetration): denies/denies/denies     ASSESSMENT   Today's Assessment:  Pt stable, examples of being in touch with feeling emotions outside of tx instead of dissociating, but feels is still lacking in mechanisms to metabolize/cope with emotion    Risk Level Assessment  Risk Level Change (if yes, please describe): No    Suicide: low (1)  Violence: low (1)  Addiction: low (1)          DIAGNOSES ASSESSED TODAY (psychiatric diagnoses and medical diagnoses that factor into management of psychiatric treatment): Dissociative disorder NOS; MDD, recurrent, moderate; PTSD by hx    CLINICAL FORMULATION (Make changes as your understanding changes. 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 Pt's strengths include intelligence, sense of humor, perseverence.    REVIEWING TODAY'S VISIT  CLINICAL INTERVENTIONS TODAY:   Gathered information  Offered support  Assessed sx and risk, especially suicidality  Discussed above topics  Discussed therapy ruptures and repairs  Explored methods/interventions that work and don't feel right for pt  Explored and helped pt express emotions that are difficult to access in order to help pt to  metabolize and build connections hampered by early trauma  Discussed uses of both intellectual and emotional working through of trauma and attachment rupture  Discussed auxiliary tx w/ pt's outside trauma therapist  Discussed t/provider's upcoming leaving [place] and possibility of continuing to work together outside [place]: Pt was engaged and responsive    PROGRESS TOWARDS GOALS: moderate    TIME SPENT IN PSYCHOTHERAPY: 45 min    [place] (Consider risk plan for patients at moderate or high risk for suicide/violence/addiction; medication plan; referrals, etc. Must coincide with treatment plan.): N/A    PLAN FOR ONGOING TREATMENT:    1. Continue tx 2x/week to stabilize pt's improvement  2. Referral to [place] day tx  program if sx increase/if appropriate  3. Referral to neurofeedback tx if available at [facility] if/when appropriate  4. Consider further group tx or auxiliary tx as appropriate  5. Encourage pt to re-engage in psychopharm consultation as needed  6. Consult with prescriber and PCP as needed     INFORMED CONSENT (for any new treatment): Patient was informed of the potential risks and benefits of the treatment, including the option not to treat, and appeared to understand and agreed to comply. Discussion included the following key points: N/A    [name], PhD, LICSW

## 2016-02-28 ENCOUNTER — Ambulatory Visit (HOSPITAL_BASED_OUTPATIENT_CLINIC_OR_DEPARTMENT_OTHER): Payer: MEDICARE | Admitting: Social Worker

## 2016-02-28 DIAGNOSIS — F449 Dissociative and conversion disorder, unspecified: Secondary | ICD-10-CM

## 2016-02-28 DIAGNOSIS — F331 Major depressive disorder, recurrent, moderate: Secondary | ICD-10-CM

## 2016-02-28 NOTE — Progress Notes (Signed)
Marland KitchenOUTPATIENT PSYCHIATRY PROGRESS NOTE    INTERPRETER: No    CONTACT INFO FOR OTHER AGENCIES AND MENTAL HEALTH PROVIDERS (IF APPLICABLE): psych[name]; outside trauma-specialist therapist [name], Psy.D, [phone]; former group therapist [name]     PROBLEM(S) ADDRESSED IN THIS SESSION:   1. Level of functioning, flashbacks, dissociative experiences and behaviors  2.  Working with transference/countertransference re: recurring relational patterns/enactments  3. Working on Runner, broadcasting/film/video between thinking/feeling/experiencing   4. Transitioning care to private practice as provider preparing to leave [place]:  1) Chief Complaint (Patient and/or guardian's own words, concerns and expressed thoughts): "In some ways I'm closer to emotion"    2) New information from patient and/or collateral (Patient's illness: context, course, modifying factors, severity, cultural, family, social, medical history):   Pt was on time to the appointment  Discusses tears in her eyes as a response to saying goodbye to receptionists at [facility], and verbalization of 'I hate you Daddy" coming up as she was writing cards to the receptionists this morning  Explored feelings/thoughts about transition of care and pt's current progress & where her hopes lie in continuing therapy: the increasing emotional connectedness to her experience; learning to not be overwhelmed by emotions as she starts feeling them more; and eventually to use her intellect and language in an emotionally connected rather than dissociated way  Pt wants to transition to private practice w/therapist; discussed the logistics and timeline of this    OBJECTIVE  DATA REVIEWED (Consider medical labs, radiology, other medical tests; screening/outcome measures; psychological testing; discussion of test results with other clinicians; consultation with other clinicians and  systems involved with patient, summary of old records): Consulted EMR    CURRENT MEDICATIONS (make clear medications prescribed by psychiatry; include OTC medications):    Current Outpatient Prescriptions:  propranolol (INDERAL) 10 MG tablet Take 1 tablet by mouth 3 (three) times daily as needed For rapid heart rate or anxiety symptoms. Disp: 90 tablet Rfl: 3   THEANINE PO Take  by mouth. Disp:  Rfl:    Multiple Vitamin (MULTIVITAMINS PO)  Disp:  Rfl:      No current facility-administered medications for this visit.     MEDICATION ADHERENCE (including barriers and how addressed): yes     MEDICATION SIDE EFFECTS (Prescribers Only): N/A    BIRTH CONTROL (ask females and males): N/A    CURRENT PREGNANCY: N/A      MENTAL STATUS EXAMINATION                     General Appearance: Groomed white woman wearing weather appropriate clothing, graying medium-length hair     Interaction with Interviewer (eye contact, attitude, behavior): Fair to good eye contact, collaborative; showing more vulnerable emotion     Physical Signs    Gait and Station (how patient walks and stands): Within normal limits                  Physical Appearance: Within normal limits          Normal Movements: Within normal limits         Speech (rate, volume, articulation): Within normal limits          Language: Within normal limits                       Mood: "in tears"  Affect: Congruous with content, appropriate to content, tearful at times    Thought Process     Rate; concrete vs abstract reasoning: Within normal limits        Logical vs illogical; associations: loose, tangential, circumstantial, intact: Pt notes she is/can be mildly tangential at times                                    Thought Content    Normal Thought Content (other than safety): Within normal limits     Perceptions (auditory, visual, tactile, etc.): Within normal limits       Impulse Control: Within normal limits         Cognition (Link to [facility])    Orientation (person,  place, time): Within normal limits      Recent and remote memory: Within normal limits           Attention span and concentration: Within normal limits         Fund of knowledge, awareness of current events and vocabulary: Not formally assessed; appear intact      Judgment: Within normal limits          Insight: Moderate to good        Suicidality/Homicidality/Aggression (Victimization or Perpetration): denies/denies/denies     ASSESSMENT   Today's Assessment:  Pt stable, showing more emotion, expresses readiness and willingness to transition to private practice    Risk Level Assessment  Risk Level Change (if yes, please describe): No    Suicide: low (1)  Violence: low (1)  Addiction: low (1)          DIAGNOSES ASSESSED TODAY (psychiatric diagnoses and medical diagnoses that factor into management of psychiatric treatment): Dissociative disorder NOS; MDD, recurrent, moderate; PTSD by hx    CLINICAL FORMULATION (Make changes as your understanding changes. 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 Pt's strengths include intelligence, sense of humor, perseverence.    REVIEWING TODAY'S VISIT  CLINICAL INTERVENTIONS TODAY:   Offered support  Assessed sx and risk, especially suicidality  Discussed above topics  Discussed therapy ruptures and repairs  Explored methods/interventions that work and don't  feel right for pt  Explored and helped pt express emotions that are difficult to access in order to help pt to metabolize and build connections hampered by early trauma  Discussed uses of both intellectual and emotional working through of trauma and attachment rupture  Discussed auxiliary tx w/ pt's outside trauma therapist  Discussed ending pt's tx here at [facility] and transitioning to private practice    PATIENT'S RESPONSE TO INTERVENTIONS: Pt was engaged and responsive    PROGRESS TOWARDS GOALS: moderate    TIME SPENT IN PSYCHOTHERAPY: 45 min    PLAN  PLAN FOR MANAGING RISK (Consider risk plan for patients at moderate or high risk for suicide/violence/addiction; medication plan; referrals, etc. Must coincide with treatment plan.): N/A    PLAN FOR ONGOING TREATMENT:  1. Continue tx 2x/week to stabilize pt's improvement  2. Referral to [facility] day tx program if sx increase/if appropriate  3. Referral to neurofeedback tx if available at [facility] if/when appropriate  4. Consider further group tx or auxiliary tx as appropriate  5. Encourage pt to re-engage in psychopharm consultation as needed  6. Consult with prescriber and PCP as needed     INFORMED CONSENT (for any new treatment): Patient was informed of the potential risks and benefits of the treatment, including the option not to treat, and appeared to understand and agreed to comply. Discussion included the following key points: N/A    [name], PhD, LICSW

## 2016-06-06 ENCOUNTER — Encounter (HOSPITAL_BASED_OUTPATIENT_CLINIC_OR_DEPARTMENT_OTHER): Payer: Self-pay | Admitting: Internal Medicine

## 2016-06-17 ENCOUNTER — Ambulatory Visit (HOSPITAL_BASED_OUTPATIENT_CLINIC_OR_DEPARTMENT_OTHER): Payer: MEDICARE | Admitting: Internal Medicine

## 2016-06-17 ENCOUNTER — Encounter (HOSPITAL_BASED_OUTPATIENT_CLINIC_OR_DEPARTMENT_OTHER): Payer: Self-pay | Admitting: Internal Medicine

## 2016-06-17 VITALS — BP 124/70 | HR 76 | Temp 98.0°F | Wt 114.0 lb

## 2016-06-17 DIAGNOSIS — F4312 Post-traumatic stress disorder, chronic: Secondary | ICD-10-CM

## 2016-06-17 DIAGNOSIS — Z23 Encounter for immunization: Secondary | ICD-10-CM

## 2016-06-17 DIAGNOSIS — F331 Major depressive disorder, recurrent, moderate: Secondary | ICD-10-CM

## 2016-06-17 NOTE — Progress Notes (Signed)
Brianna [name] is a 60 year old patient  Here today re:   She wd like referral to get neurofeedback thru [facility].  Her current therapist told her she cd get this at [facility]  But needs referral from pcp.  It is something that is done w a cap on head and electrodes like an EEG,  Sort of like biofeedback.    She also will be needing a new therapist, actually, b/c current therapist not taking MCR anymore, private-pay.  No other concerns/issues.    (sched'd app't for digestive problems b/c complicated to explain, but GI is about stable; troubled by gas/flatulence, o/w OK.)    ROS otherwise negative for psychiatric, cardiac, pulmonary, gastrointestinal, genitourinary, musculoskeletal or neurological symptoms.    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  Previously in [place].  Lives alone.  Single since '05.  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       Smoking status: Never Smoker                                                              Alcohol use: No                 Current Outpatient Prescriptions:  propranolol (INDERAL) 10 MG tablet Take 1 tablet by mouth 3 (three) times daily as needed For rapid heart rate or anxiety symptoms. Disp: 90 tablet Rfl: 3   THEANINE PO Take  by mouth. Disp:  Rfl:    Multiple Vitamin (MULTIVITAMINS PO)  Disp:  Rfl:      No current facility-administered medications for this visit.   Reports no difficulties with meds, co-pays or insurance  Payor: MEDICARE  / Plan: MEDICARE / Product Type: *No Product type* /   Allergy History: Penicillins    Health maintenance: due for: ZOSTER VACCINE,AGE 32 AND OLDER (ONCE) due on 07/03/2014  INFLUENZA VACCINE(1) due on 04/11/2016  PHYSICAL EXAM (AGE 39+) due on 08/13/2016  COLONOSCOPY due on 09/09/2016      PHYSICAL EXAMINATION:  BP 124/70  Pulse 76  Temp 98 F (36.7 C) (Temporal)  Wt 51.7 kg (114 lb)  SpO2 97%  BMI 20.33 kg/m2   Comfortable-appearing; alert, ambulatory, attentive.  Moves easily; communication unimpaired.   Affect appropriate to situation.    Cognitive status intact to history.  counselling-oriented visit.     Most Recent Weight Reading(s)  06/17/16 : 51.7 kg (114 lb)  02/18/16 : 50.8 kg (112 lb)  08/14/15 : 53.1 kg (117 lb)      ASSESSMENT/PLAN:  1. Post-traumatic stress disorder, chronic / 2. Major depressive disorder, recurrent  episode, moderate ([place])  Requested neurofeedback via referral to psych / she shd be getting an app't to [facility] ([place])    2. Need for prophylactic vaccination and inoculation against influenza  - PR IIV4 VACC SPLIT VIRUS 0.5 ML DOS FOR IM USE    Counselling-oriented visit, 15 minutes spent face-to-face with patient, of which greater than 50% was direct counselling; topics covered included discussing diagnoses, reviewing typical symptoms and  approaches to workup, and options for management/treatment.      We discussed her med(s) and the importance of medication compliance. The patient was ready to learn and no apparent learning barriers were identified. I explained the diagnosis and treatment plan, and the patient expressed understanding of the content. Possible side effects of the prescribed medication(s) were explained.  I attempted to answer any questions regarding the diagnosis and the proposed treatment.      1. The patient indicates understanding of these issues and agrees with the plan.  2. Past medical history, medications, problem list,  allergies, family history, health habits, social history: reviewed, updated, as noted in EPIC.   3. I reconciled the patient's medication list and prepared and supplied needed refills.    Electronically signed by: Blima Singer MD, 06/17/2016 2:50 PM

## 2016-06-18 ENCOUNTER — Other Ambulatory Visit (HOSPITAL_BASED_OUTPATIENT_CLINIC_OR_DEPARTMENT_OTHER): Payer: Self-pay | Admitting: Social Worker

## 2016-06-18 NOTE — Progress Notes (Unsigned)
Thank you for your referral. Patient referral forwarded to Debbie Deegan coordinator of Geriatric Dept for further review and process. If you have any question regarding this referral please feel free to contact Geriatric Psych main office at [phone]

## 2016-07-14 ENCOUNTER — Encounter (HOSPITAL_BASED_OUTPATIENT_CLINIC_OR_DEPARTMENT_OTHER): Payer: Self-pay | Admitting: Internal Medicine

## 2016-07-17 ENCOUNTER — Ambulatory Visit (HOSPITAL_BASED_OUTPATIENT_CLINIC_OR_DEPARTMENT_OTHER): Payer: Self-pay

## 2016-07-29 ENCOUNTER — Ambulatory Visit (HOSPITAL_BASED_OUTPATIENT_CLINIC_OR_DEPARTMENT_OTHER): Payer: Self-pay

## 2016-08-15 ENCOUNTER — Encounter (HOSPITAL_BASED_OUTPATIENT_CLINIC_OR_DEPARTMENT_OTHER): Payer: Self-pay | Admitting: Internal Medicine

## 2016-08-15 DIAGNOSIS — F331 Major depressive disorder, recurrent, moderate: Principal | ICD-10-CM

## 2016-08-17 NOTE — Dental Note (Signed)
Medical Alert: Allergies    Mental Disorders    Other    Vitamin Deficiency  Medications: Other  Allergies:      Penicillin  Since Last Visit: Medical Alert: No Change    Medications: No Change    Allergies:        No Change  Pain Scale Type: Numeric Pain Scale Pain Level: 0  Description:    Patient presented for deep scaling and root planing of UL and UR, #2, 3, 4,   13,   14 and 15.    Interpreter: None    Patient pain level at beginning of appointment: 0    *Medical/Regana Kemple History*  Medical history: No change    *Radiographs*  Radiographs taken: None taken  Radiographs taken to evaluate for: N/A    *Hygiene Observations*  - Overall Oral Hygiene:Fair  - Plaque: Medium  - Staining: Light  - Calculus: Medium  - Food Impaction: Light  - Bleeding: Light  - Gingival Attachment: Recession  - Perio classification: II-Chronic Periodontitis, generalized, moderate  severity  Home Care: Fair    *Treatment*  Local anesthetic used: Topical Anesthetic Gel (Benzocaine 20%) as needed for  pain control, no complications.  Whole number of carpules used: 0  Services provided: Oral hygiene Instruction and Rt/P and Scale consisted of  hand scale, prophy cup polish, Cavitron and flossing.  Patient rinsed with PerioGard after treatment.    *Assessment & Plan*  Patient was not examined by dentist.  Assessment/Plan: NA    Patient pain level at end of appointment: 0    Patient left ambulatory, satisfied with treatment. Any remaining planned  treatments were explained in detail to the patient.    Hygiene Recare 3 month interval    Next Visit: restorative per Dr Rona Ravens  *Instructions for Receptionist*  Procedure:  Provider:  When:    ----- Signed on Tuesday, July 29, 2016 at 12:07:20 PM  -----  ----- Provider: Mal Amabile, RDH -- Clinic: [facility] -----

## 2016-08-17 NOTE — Dental Note (Signed)
Patient presented for a periodic exam.    Patient pain level at beginning of appointment:  0    Interpreter: None    **Medical/Hae Ahlers History**  Chief complaint: I had pain a couple months ago but it went away  History of present illness:  Pertinent medical alerts: None  Medical history: No change    **Periodic Exam**  Extraoral exam: WNL (normal and symmetrical)  Intraoral exam: WNL (good hygiene, no masses, ulcerations, or discoloration)  Occlusion classification: Class I  Periodontal probing: Complete periodontal charting  Periodontal exam: Other:  Radiographs taken: 4 BWX and 2 or more PAs  Radiographs taken to evaluate for: Eval for caries and/or pathology  Radiographic exam: Caries    **Assessment**  Prescription given: None  Referral: Not given  Assessment plan: Oral cancer screening: WNL  EO: WNL  IO: WNL    Perio:  plaque level - moderate  calculus level - low  PD 54mm in max arch  localized chronic periodontitis    Clinical findings:  #18 - DO fractured tooth    tx plan:  SRP Upper  #18 - DO amalgam  night guard.    Patient pain level at end of appointment: 0    Patient left ambulatory, satisfied with treatment. Any remaining planned  treatments were explained in detail to the patient.    Assistant: Berta Minor    Next visit: What: filling    ----- Signed on Thursday, July 17, 2016 at 9:55:16 AM  -----  ----- Provider: Celedonio Savage,  -- Clinic: [facility] -----  ----- Override Provider: Edmonia Caprio, DMD -----

## 2016-08-17 NOTE — Dental Note (Unsigned)
*  Instructions for Receptionist*  What Procedure? SRP upper   Which Provider?  How Soon?  Reason?

## 2016-08-18 ENCOUNTER — Encounter (HOSPITAL_BASED_OUTPATIENT_CLINIC_OR_DEPARTMENT_OTHER): Payer: Self-pay | Admitting: Internal Medicine

## 2016-08-18 ENCOUNTER — Ambulatory Visit (HOSPITAL_BASED_OUTPATIENT_CLINIC_OR_DEPARTMENT_OTHER): Payer: MEDICARE | Admitting: Internal Medicine

## 2016-08-18 ENCOUNTER — Other Ambulatory Visit (HOSPITAL_BASED_OUTPATIENT_CLINIC_OR_DEPARTMENT_OTHER): Payer: Self-pay | Admitting: Social Worker

## 2016-08-18 VITALS — BP 96/70 | HR 80 | Ht 62.6 in | Wt 117.5 lb

## 2016-08-18 DIAGNOSIS — Z130 Encounter for screening for diseases of the blood and blood-forming organs and certain disorders involving the immune mechanism: Secondary | ICD-10-CM

## 2016-08-18 DIAGNOSIS — Z1239 Encounter for other screening for malignant neoplasm of breast: Secondary | ICD-10-CM

## 2016-08-18 DIAGNOSIS — Z13228 Encounter for screening for other metabolic disorders: Secondary | ICD-10-CM

## 2016-08-18 DIAGNOSIS — L659 Nonscarring hair loss, unspecified: Secondary | ICD-10-CM

## 2016-08-18 DIAGNOSIS — Z0000 Encounter for general adult medical examination without abnormal findings: Secondary | ICD-10-CM

## 2016-08-18 DIAGNOSIS — Z1321 Encounter for screening for nutritional disorder: Secondary | ICD-10-CM

## 2016-08-18 DIAGNOSIS — Z1211 Encounter for screening for malignant neoplasm of colon: Secondary | ICD-10-CM

## 2016-08-18 DIAGNOSIS — F331 Major depressive disorder, recurrent, moderate: Secondary | ICD-10-CM

## 2016-08-18 DIAGNOSIS — Z1329 Encounter for screening for other suspected endocrine disorder: Secondary | ICD-10-CM

## 2016-08-18 LAB — CBC WITH PLATELET
HEMATOCRIT: 40.7 % (ref 34.1–44.9)
HEMOGLOBIN: 13.5 g/dL (ref 11.2–15.7)
MEAN CORP HGB CONC: 33.2 g/dL (ref 31.0–37.0)
MEAN CORPUSCULAR HGB: 31.6 pg (ref 26.0–34.0)
MEAN CORPUSCULAR VOL: 95.3 fL (ref 80.0–100.0)
MEAN PLATELET VOLUME: 10.7 fL (ref 8.7–12.5)
PLATELET COUNT: 254 10*3/uL (ref 150–400)
RBC DISTRIBUTION WIDTH STD DEV: 43.8 fL (ref 35.1–46.3)
RBC DISTRIBUTION WIDTH: 12.7 % (ref 11.5–14.3)
RED BLOOD CELL COUNT: 4.27 M/uL (ref 3.90–5.20)
WHITE BLOOD CELL COUNT: 4.6 10*3/uL (ref 4.0–11.0)

## 2016-08-18 LAB — GLUCOSE RANDOM: Glucose Random: 77 mg/dL (ref 74–160)

## 2016-08-18 LAB — THYROID SCREEN TSH REFLEX FT4: THYROID SCREEN TSH REFLEX FT4: 0.96 u[IU]/mL (ref 0.358–3.740)

## 2016-08-18 LAB — FERRITIN: FERRITIN: 41 ng/mL (ref 8–252)

## 2016-08-18 NOTE — Progress Notes (Signed)
Brianna [name] is a 61 year old woman  Here for her annual physical.      Concerns today:  Dear Dr. Ivin Booty,   I have an appointment for a physical on Mon. morning, so figured I'd send these questions/comments so we had them in front of [name].   I have not yet heard from [facility] about neurofeedback or therapy.     I got a letter about setting up an appointment for a mammogram, but it said to check about how many days insurance required between mammograms.     Also, I wonder if there would be time to talk about information to be gained from getting the following blood tests done:      DHEA-S   Estradiol and Estrogen   Free and Total Testosterone   Progesterone   Fasting glucose   Iron levels   Complete blood count   Thyroid   Glucocorticoid     Reason she asks about the hormonal evaluation labs:   She has noted more hair falling out, last couple of months.  Does not feel like more of her scalp shows, and her ponytail is no thinner.  But she just sees a lot of hair coming out in brush, shower, etc.   Does not think she had a fever, illness, major life event etc. in the months prior to this.      She is due for ZOSTER VACCINE,AGE 78 AND OLDER (ONCE) due on 07/03/2014  PHYSICAL EXAM (AGE 90+) due on 08/13/2016  COLONOSCOPY due on 09/09/2016  Her past medical history is significant for   Patient Active Problem List:     Post-traumatic stress disorder, chronic     FATIGUE GENERAL     Family history of malignant neoplasm of breast     Screening for unspecified condition     Dissociative disorder or reaction, unspecified     Major depressive disorder, recurrent episode, moderate ([place])     Bunion     Vitamin D deficiency     High myopia, both eyes     Pseudophakia     History of osteopenia       Current Outpatient Prescriptions:  propranolol (INDERAL) 10 MG tablet Take 1 tablet by mouth 3 (three) times daily as needed For rapid heart rate or anxiety symptoms. Disp: 90 tablet Rfl: 3   THEANINE PO Take  by mouth. Disp:  Rfl:     Multiple Vitamin (MULTIVITAMINS PO)  Disp:  Rfl:      No current facility-administered medications for this visit.   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  [place] in [place].  Lives alone.  Single since '05.  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       Denies changes in energy level, appetite, sleep, mood and memory/concentration. Not troubled by new or changing headaches.  No new concerns re: eyes, vision. No new dental problems.   Breathing: normal; no unexplained shortness of breath or cough. No symptoms in chest - no chest pressure or any other new or uncomfortable  sensations. GI: no new issues at present. No changes in bowels. No genitourinary  symptoms.    GYN: she is postmenopausal and having no bleeding.  Gynecologic or vulvovaginal symptoms: none.    Sexual History: not currently sexually active.      HUMAN PAPILLOMAVIRUS (no units)   Date Value   05/27/2013 Negative for HPV High Risk mRNA  (HPV types: 16,18,31,33,35,39,45,51,52,56,58,59,66,68)   12/05/2010 Negative for High Risk DNA Probe  (HPV types: 16,18,31,33,35,39,45,51,52,56,58,59,68)   ----------      OBJECTIVE:   BP 96/70  Ht 5' 2.6" (1.59 m)  Wt 53.3 kg (117 lb 8 oz)  BMI 21.08 kg/m2   Comfortable-appearing; alert, ambulatory, attentive.   Communication unimpaired.   Affect appropriate to situation.    Cognitive status: intact to details of history.  She ambulates, gets in and out of chair, gets on and off exam table with normal mobility.   HEENT:  NC/AT.  Voice normal.  Face: No lesions  Eyes: normal conjunctivae, anicteric.  No peri-orbital inflammation.  Ear R: No lesions.  Canal clear. TM clear.  Ear L: No lesions.  Canal clear. TM clear.  Oral  Cavity/Oropharynx: Mucous membranes moist. Teeth in good condition.   Neck is supple without masses, no cervical lymphadenopathy.  No thyromegaly.  Back without deformity.  Chest is clear to auscultation.   Heart sounds: normal S1 and S2, regular rate and rhythm, no murmurs.  Breasts: normal without suspicious masses, skin or nipple changes or axillary nodes.   Abdomen is soft, no tenderness or masses, no appreciable organomegaly.    Pelvic: not indicated.   Extremities without cyanosis or edema  Skin unremarkable, warm and dry, no rash    Most Recent Weight Reading(s)  08/18/16 : 53.3 kg (117 lb 8 oz)  06/17/16 : 51.7 kg (114 lb)  02/18/16 : 50.8 kg (112 lb)  08/14/15 : 53.1 kg (117 lb)  04/11/15 : 52.2 kg (115 lb)    Last few blood pressures:  Most Recent BP Reading(s)  08/18/16 : 96/70  06/17/16 : 124/70  02/18/16 : 100/64    Labs:   LOW DENSITY LIPOPROTEIN DIRECT (mg/dL)   Date Value   04/29/2013 116   ----------   HIGH DENSITY LIPOPROTEIN (mg/dL)   Date Value   04/29/2013 60   ----------   HEMATOCRIT (%)   Date Value   11/21/2013 40.8   ----------    Glucose Random (mg/dL)   Date Value   02/14/2014 96   ----------     ALANINE AMINOTRANSFERASE (IU/L)   Date Value   11/26/2007 19   ----------   THYROID SCREEN TSH REFLEX FT4 (uIU/mL)   Date Value   02/14/2014 0.684   ----------    ASSESSMENT/PLAN:   1. Medicare annual wellness visit, subsequent  pe done.    2. Screening for endocrine, nutritional, metabolic and immunity disorder  At her request:  - COLLECTION VENOUS BLOOD VENIPUNCTURE  - CBC WITH PLATELET  - FERRITIN  - THYROID SCREEN TSH REFLEX FT4    3. Special screening for malignant neoplasm of colon  - POC IMMUNOASSAY FECAL OCCULT BLOOD TEST; Future    4. Major depressive disorder, recurrent episode, moderate ([place])  Re-ordered the referral, specifying to see [facility] Go.    5. Screening for breast cancer  She will wait until the 2-yr point for her next mammo.    6. Falling hair  Discussed telogen effluvium,  discussed various factors which can contribute; labs as above.    Also:  Preventive health care:  Reviewed: ways to improve diet, keep a  healthy weight, maintain a regular exercise routine. Discussed ways that she can reduce her risk of neurovascular, cardiovascular, and renal disease.     1. The patient indicates understanding of these issues and agrees with the plan.  2. I reviewed the patient's medical information and medical history   3.  I reconciled the patient's medication list and prepared and supplied needed refills.  4.  I have reviewed the past medical, family, and social history sections including the medications and allergies listed in the above medical record    Electronically signed by: Tiburcio Bash, MD, 08/18/2016 8:12 AM                 Female Annual Wellness Visit  Progress Note     Subjective:   Brianna [name] is a 61 year old female who comes for the Subsequent Annual wellness visit, including Personalized Prevention Plan (PPPE) (it has been at least 12 months since the annual wellness visit)..     History: (These sections have been updated in their respective activities)   Review of Patient's Allergies indicates:   Penicillins             Hives    Comment:childhood.  Past Medical History:  No date: Anxiety  No date: Astigmatism  No date: Cataract  BMD 8/04: Disorder of bone and cartilage, unspecified      Comment: Osteopenia only, femoral neck.  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 MAH:                EGD, Cscopy 09/09/06: all normal. 2/08: rslvd.  No date: Major depressive disorder, single episode, mod*  08/11/2002: Menopause  No date: Myopia  No date: Presbyopia  No date: Thyroid disease  No date: Wears eyeglasses  Past Surgical History:  No date: CATARACT REMOVAL INSERTION OF LENS  No date: EXCISION TONSIL TAGS  No date: IMPLANT MESH OPN HERNIA RPR/DEBRIDEMENT CLOSURE      Comment: inguinal hernia    Family History    Mental/Emotional  Disorders Mother     Mental/Emotional Disorders Father     Mental/Emotional Disorders Sister     Cancer - Breast Mother     Comment: dx'd at 34. D. age 2.       Social History  Social History   Marital status: Single  Spouse name: N/A    Years of education: N/A  Number of children: 0     Occupational History  disability      NONE      Social History Main Topics   Smoking status: Never Smoker    Smokeless tobacco: Not on file    Alcohol use No    Drug use: No    Sexual activity: Not Currently    Partners: Male    Comment: not since '05.     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  Previously in [place].  Lives alone.  Single since '05.  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       Current Outpatient Prescriptions:  propranolol (INDERAL) 10 MG tablet Take 1 tablet by mouth 3 (three) times daily as needed For rapid heart rate or anxiety symptoms. Disp: 90 tablet Rfl: 3   THEANINE PO Take  by mouth. Disp:  Rfl:  Multiple Vitamin (MULTIVITAMINS PO)  Disp:  Rfl:      No current facility-administered medications for this visit.     Providers and Suppliers (See demographics, encounters tab, filter by provider)  Tiburcio Bash, MD    [place], [place].  Phone: [phone] Fax: [phone]    Specialists:  Psychiatry    Screening:     Cognitive screen -  Cognitive impairment is not detected byobservation and patient report   Depression screen, See Questions 3-4 of Health Risk Assessment.  Last PHQ9 total score (Recorded from the Screening Questions section):  PHQ-9 TOTAL SCORE 02/18/2016   Questionnaire Total Score -   Doc FlowSheet Total Score -   Doc FlowSheet Total Score 2      Vision screen -    Vision with corrective lenses: adequate for adl's.  Hearing loss screen -  Trouble hearing the television or radio when others can hear it? No  Straining or  struggling to hear/understand conversations? No   Falls risk screen, See Questions 19-20 of Health Risk Assessment. Function screen, See Questions 8-13 of Health Risk Assessment.   Screen for overweight and hypertension:   BP 96/70  Pulse 80  Ht 5' 2.6" (1.59 m)  Wt 53.3 kg (117 lb 8 oz)  SpO2 97%  BMI 21.08 kg/m2  Body mass index is 21.08 kg/m.   One-time screening for abdominal aortic aneurysm (AAA) by ultrasonography in men aged 48 to 20 who have ever smoked:  Is not indicated in this female who reports that she has never smoked. She does not have any smokeless tobacco history on file.   Hepatitis B risk Assessment:    Because of lack of identifiable risk factors, her risk for hepatitis B is low (1) (moderate or high: consider Hep B vaccination)     Home safety screen:    Does home have throw rugs, poor lighting or slippery bathtub or shower? No  Does home LACK grab bars in bathrooms or handrails on stairs and steps? No  Does home LACK functioning smoke alarms? No   Health Risk Assessment: Recorded in the Screening Questions Section, can be reviewed in Flowsheets activity tab under [place]'.     All above screening responses were reviewed, and all positive or abnormal responses were further evaluation or sent for referral as appropriate.    Counseling:     Advanced Care Planning:  The patient has identified a Healthcare Proxy (recorded in demographics section)    Health Maintenance: See Health Maintenance Report for details of screening done.    Health Maintenance Items Overdue and Due Soon  ZOSTER VACCINE,AGE 47 AND OLDER (ONCE) due on 07/03/2014  PHYSICAL EXAM (AGE 59+) due on 08/13/2016  COLONOSCOPY due on 09/09/2016    All Health Maintenance Items and DueDates -  ZOSTER VACCINE,AGE 47 AND OLDER (ONCE) due on 07/03/2014  PHYSICAL EXAM (AGE 59+) due on 08/13/2016  COLONOSCOPY due on 09/09/2016  AWQ Questionnaire due on 02/17/2017  MAMMOGRAPHY due on 07/17/2017  LIPID SCREENING due on  04/29/2018  PAP SMEAR due on 05/27/2018  HPV SCREENING due on 05/27/2018  HEALTH CARE PROXY due on 02/15/2019  TDAP/TD VACCINE(2 - Td) due on 04/30/2023  HIV SCREENING Completed  HEP B HIGH RISK VACCINE EVAL (ONCE) Completed  HEP C SCREEN (DOB [birth year]) Completed  INFLUENZA VACCINE Completed    Medicare GYN Exam:     n/a    Assessment & Plan:     Acute  Issues:  In addition to the above items, we also discussed the following acute issues:     Medicare annual wellness visit, subsequent  (primary encounter diagnosis)  Screening for endocrine, nutritional, metabolic and immunity disorder  Special screening for malignant neoplasm of colon  Major depressive disorder, recurrent episode, moderate ([place])  Screening for breast cancer  Falling hair    Orders, Prescriptions, and Referrals placed in this Encounter:    Orders Placed This Encounter      ROUTINE VENIPUNCTURE      CBC with Platelet      Ferritin      Thyroid Screen TSH Reflex FT4      Glucose Random      IFOB (Immunoassay Fecal Occult Blood Point of Care Test)    Personalized Instructions:   See Patient Instruction section

## 2016-08-18 NOTE — Progress Notes (Unsigned)
08/18/16 - Thank you for your referral. It was forwarded to [facility] for further review.

## 2016-09-02 ENCOUNTER — Encounter (HOSPITAL_BASED_OUTPATIENT_CLINIC_OR_DEPARTMENT_OTHER): Payer: Self-pay | Admitting: Internal Medicine

## 2016-09-02 DIAGNOSIS — F4310 Post-traumatic stress disorder, unspecified: Principal | ICD-10-CM

## 2016-09-02 MED ORDER — PROPRANOLOL HCL 10 MG PO TABS: 10 mg | tablet | Freq: Three times a day (TID) | ORAL | 3 refills | 0 days | Status: DC | PRN

## 2016-09-02 MED ORDER — PROPRANOLOL HCL 10 MG PO TABS
10.0000 mg | ORAL_TABLET | Freq: Three times a day (TID) | ORAL | 3 refills | Status: DC | PRN
Start: 2016-09-02 — End: 2017-08-26

## 2016-09-02 NOTE — Telephone Encounter (Signed)
PER Patient (self), Brianna [name] is a 61 year old female has requested a refill of propranolol 10 mg.      Last Office Visit: 08/18/16 with Tiburcio Bash  Last Physical Exam: 08/18/16      Other Med Adult:  Most Recent BP Reading(s)  08/18/16 : 96/70          Cholesterol (mg/dL)   Date Value   04/29/2013 185   ----------    LOW DENSITY LIPOPROTEIN DIRECT (mg/dL)   Date Value   04/29/2013 116   ----------    HIGH DENSITY LIPOPROTEIN (mg/dL)   Date Value   04/29/2013 60   ----------    TRIGLYCERIDES (mg/dL)   Date Value   04/29/2013 106   ----------        THYROID SCREEN TSH REFLEX FT4 (uIU/mL)   Date Value   08/18/2016 0.960   ----------        TSH (THYROID STIM HORMONE) (uIU/ml)   Date Value   06/28/2002 0.14 (L)   ----------    No results found for: HGBA1C    No results found for: POCA1C      No results found for: INR      SODIUM (mmol/L)   Date Value   01/01/2012 142   ----------      POTASSIUM (mmol/L)   Date Value   01/01/2012 4.2   ----------          CREATININE (mg/dl)   Date Value   01/01/2012 0.7   ----------      Documented patient preferred pharmacies:    [place], [place].  Phone: [phone] Fax: [phone]

## 2016-09-02 NOTE — Telephone Encounter (Signed)
Message from MyChart:  Brianna [name] would like a refill of the following medications:  propranolol (INDERAL) 10 MG tablet Tiburcio Bash, MD]    Preferred pharmacy: [facility], [place]:

## 2016-09-15 ENCOUNTER — Ambulatory Visit (HOSPITAL_BASED_OUTPATIENT_CLINIC_OR_DEPARTMENT_OTHER): Payer: Self-pay

## 2016-09-15 NOTE — Dental Note (Signed)
Patient presented for a composite restoration #29, O.    Patient pain level at beginning of appointment: 0    Interpreter: None    *Medical/Jannah Guardiola History*  Chief complaint: Pt presents for #29-O comp.  HPI: Caries  Medical history reviewed and updated.    *Composite Restoration*  Reason(s) for restoration(s): Primary caries  Radiographs taken: None taken  Radiographs taken to evaluate for: N/A  Local anesthetic used: 2% Lidocaine (1:100,000 epinephrine)  Whole number of carpules used: 1  Type of injection: Mental nerve block  Existing material removed: None  Liner and base placed: None  Composite material used: Filtek Supreme Ultra  Shade: A2    The tooth was prepared with adequate water irrigation. 40% phosphoric acid  etchant was placed on preparation and rinsed off completely after 15-30  seconds. Tooth was dried and isolated. Bonding agent placed, air gently  blown, and light cured for 20-30 seconds. Selected composite material(s)  was/were placed and light cured for at least 40 seconds. Margins, contacts,   and   occlusion were checked and adjusted as needed. Polished. Post-op  instructions given.    *Assessment*  Prescription given: None  Referral: Not given  Assessment/Plan:    Pt complained of sharp ridges in the upper left side, specifically #14.  Smoothed out buccal ridge with football bur, pt reports feeling much better.    Pt has difficulty opening, cannot tolerate suctioning well, requests cup to  spit into for next appointment.    Patient pain level at end of appointment: 0    Patient left ambulatory, satisfied with treatment. Any remaining planned  treatments were explained in detail to the patient.    Assistant: Volunteer    Next visit: What: #18-DO  Reason: Defective restoration    ----- Signed on Monday, September 15, 2016 at 9:31:51 AM  -----  ----- Provider: Tarri Abernethy, DDS -- Clinic: [facility] -----

## 2016-09-26 ENCOUNTER — Encounter (HOSPITAL_BASED_OUTPATIENT_CLINIC_OR_DEPARTMENT_OTHER): Payer: Self-pay

## 2016-10-17 ENCOUNTER — Ambulatory Visit (HOSPITAL_BASED_OUTPATIENT_CLINIC_OR_DEPARTMENT_OTHER): Payer: MEDICARE | Admitting: Internal Medicine

## 2016-10-17 ENCOUNTER — Encounter (HOSPITAL_BASED_OUTPATIENT_CLINIC_OR_DEPARTMENT_OTHER): Payer: Self-pay | Admitting: Internal Medicine

## 2016-10-17 VITALS — BP 120/84 | HR 77 | Temp 98.0°F | Wt 124.0 lb

## 2016-10-17 DIAGNOSIS — S6990XA Unspecified injury of unspecified wrist, hand and finger(s), initial encounter: Secondary | ICD-10-CM

## 2016-10-17 NOTE — Progress Notes (Signed)
Brianna [name] is a 61 year old female who presents for follow up    Right 2nd finger: cut on umbrella few days ago  Slight redness, no purulence, no increasing pain  No warmth, no redness  NO f/s/chills    Patient Active Problem List:     Post-traumatic stress disorder, chronic     FATIGUE GENERAL     Family history of malignant neoplasm of breast     Screening for unspecified condition     Dissociative disorder or reaction, unspecified     Major depressive disorder, recurrent episode, moderate (HCC)     Bunion     Vitamin D deficiency     High myopia, both eyes     Pseudophakia     History of osteopenia        BP 120/84 (Site: LA, Position: Sitting, Cuff Size: Reg)  Pulse 77  Temp 98 F (36.7 C)  Wt 56.2 kg (124 lb)  SpO2 97%  BMI 22.25 kg/m2    Gen: well appearing  Psych: alert & oriented x 3  Skin: tiny 3 mm laceration 2nd finger, healing, no purulence, no warmth, no redess       (S69.90XA) Finger injury, initial encounter  Comment: tiny laceration, healing  Plan: bandaid, bacitracin    rtc prn

## 2016-10-24 ENCOUNTER — Ambulatory Visit (HOSPITAL_BASED_OUTPATIENT_CLINIC_OR_DEPARTMENT_OTHER): Payer: MEDICARE | Admitting: Ophthalmology

## 2016-10-24 ENCOUNTER — Encounter (HOSPITAL_BASED_OUTPATIENT_CLINIC_OR_DEPARTMENT_OTHER): Payer: Self-pay | Admitting: Ophthalmology

## 2016-10-24 DIAGNOSIS — H524 Presbyopia: Secondary | ICD-10-CM

## 2016-10-24 DIAGNOSIS — H26492 Other secondary cataract, left eye: Secondary | ICD-10-CM

## 2016-10-24 NOTE — Progress Notes (Signed)
.  Brianna [name] was seen at the [facility] for evaluation of cloudy vision.  She is status post cataract surgery with a Crysta lens implant in both eyes.  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    Options were discussed regarding treatment of the opacified posterior capsule.  She would like to proceed with YAG laser capsulotomy to improve her vision in the left eye.

## 2016-10-24 NOTE — Nursing Note (Signed)
43 years / F  -  Here for comprehensive eye exam    Pt reports she is having trouble focusing.  Eyes are not working well together.      Pt wears reading glasses.  Needs to wear them more often lately.    Pt states her eyes are sensitive to bright light and darkness.    No other ocular c/o's.  No pain, No headache.  No flashes or floaters.     H/O: pseudophakia OU 2015 (Crytal[employer] ) Wenda Low. Lonia Chimera, MD    Ocular History:   High myopia, both eyes

## 2016-10-24 NOTE — Nursing Note (Signed)
For evaluation of blurred vision.

## 2016-10-28 ENCOUNTER — Ambulatory Visit (HOSPITAL_BASED_OUTPATIENT_CLINIC_OR_DEPARTMENT_OTHER): Payer: Self-pay

## 2016-10-28 NOTE — Dental Note (Signed)
Next visit: What: #18-DO  Reason: Defective restoration  ----- Provider: Tarri Abernethy, DDS -- Clinic: [facility] -----    Medical Alert: Allergies    Mental Disorders    Other    Vitamin Deficiency  Medications: Other  Allergies:      Penicillin  Since Last Visit: Medical Alert: No Change    Medications: No Change    Allergies:        No Change  Pain Scale Type: Numeric Pain Scale Pain Level: 0  Description:    Patient presented for a hygiene visit.    Interpreter: None    Patient pain level at beginning of appointment: 0    *Medical/Krishna Dancel History*  Medical history: No change    *Radiographs*  Radiographs taken: None taken  Radiographs taken to evaluate for: N/A    *Hygiene Observations*  - Overall Oral Hygiene:Fair  - Plaque: Medium  - Staining: Light  - Calculus: Medium  - Food Impaction: None  - Bleeding: Light  - Gingival Attachment: Recession  - Periodontal description: Generalized fibrotic tissue  - Periodontal probing: N/A  - Perio classification: II-Chronic Periodontitis, generalized, moderate  severity  -Home Care: Fair  -Intraoral exam: WNL ( no masses, ulcerations, or discoloration)    *Treatment*  Services provided: Prophy and Oral hygiene Instruction.  Prophylaxis today consisted of hand scale, prophy cup polish, Cavitron and  flossing. Proper technique of brushing demonstrated.  Flossing with fingers   and   a floss holder demonstrated.  Patient given a soft toothbrush and floss.    *Assessment & Plan*  Patient was not examined by dentist.  Assessment/Plan: NA    Patient pain level at end of appointment: 0    Patient left ambulatory, satisfied with treatment. Any remaining planned  treatments were explained in detail to the patient.    Hygiene Recare 4 month interval    Next Visit:see above  *Instructions for Receptionist*  Procedure:  Provider:  When:    ----- Signed on Tuesday, October 28, 2016 at 10:05:31 AM  -----  ----- Provider: Jamie Brookes - Quillian Quince, RDH -- Clinic: [facility] -----

## 2016-11-05 ENCOUNTER — Ambulatory Visit (HOSPITAL_BASED_OUTPATIENT_CLINIC_OR_DEPARTMENT_OTHER): Payer: MEDICARE | Admitting: Lab

## 2016-11-05 DIAGNOSIS — Z1211 Encounter for screening for malignant neoplasm of colon: Secondary | ICD-10-CM

## 2016-11-05 LAB — POC IMMUNOASSAY FECAL OCCULT BLOOD TEST
EXPIRATION DATE: 12112018
POC FECAL OCCULT BLOOD TEST (IMMUNOASSAY): NEGATIVE

## 2016-11-05 NOTE — Progress Notes (Signed)
ifob received.

## 2016-11-06 ENCOUNTER — Encounter (HOSPITAL_BASED_OUTPATIENT_CLINIC_OR_DEPARTMENT_OTHER): Payer: Self-pay | Admitting: Internal Medicine

## 2016-11-13 ENCOUNTER — Ambulatory Visit (HOSPITAL_BASED_OUTPATIENT_CLINIC_OR_DEPARTMENT_OTHER): Payer: MEDICARE | Admitting: Family Medicine

## 2016-11-13 ENCOUNTER — Encounter (HOSPITAL_BASED_OUTPATIENT_CLINIC_OR_DEPARTMENT_OTHER): Payer: Self-pay | Admitting: Family Medicine

## 2016-11-13 VITALS — BP 110/80 | HR 74 | Temp 98.2°F | Wt 118.0 lb

## 2016-11-13 DIAGNOSIS — R3915 Urgency of urination: Principal | ICD-10-CM

## 2016-11-13 DIAGNOSIS — N3941 Urge incontinence: Secondary | ICD-10-CM

## 2016-11-13 DIAGNOSIS — R3129 Other microscopic hematuria: Secondary | ICD-10-CM | POA: Insufficient documentation

## 2016-11-13 LAB — POC URINALYSIS
BILIRUBIN, URINE: NEGATIVE
GLUCOSE,URINE: NEGATIVE
KETONE, URINE: NEGATIVE
LEUKOCYTE ESTERASE: NEGATIVE
NITRITE, URINE: NEGATIVE
PH URINE: 5.5 (ref 5.0–8.0)
PROTEIN, URINE: NEGATIVE
SPECIFIC GRAVITY, URINE: 1.02 (ref 1.003–1.030)
UROBILINOGEN URINE: 0.2 (ref 0.2–1.0)

## 2016-11-13 LAB — BASIC METABOLIC PANEL
ANION GAP: 8 mmol/L (ref 5–15)
BUN (UREA NITROGEN): 16 mg/dL (ref 7–18)
CALCIUM: 9 mg/dL (ref 8.5–10.1)
CARBON DIOXIDE: 26 mmol/L (ref 21–32)
CHLORIDE: 108 mmol/L — ABNORMAL HIGH (ref 98–107)
CREATININE: 0.7 mg/dL (ref 0.4–1.2)
ESTIMATED GLOMERULAR FILT RATE: >60 mL/min (ref >60)
Glucose Random: 114 mg/dL (ref 74–160)
POTASSIUM: 4 mmol/L (ref 3.5–5.1)
SODIUM: 142 mmol/L (ref 136–145)

## 2016-11-13 LAB — ADD ON CAN'T BE DONE CHEM UR

## 2016-11-13 NOTE — Progress Notes (Signed)
Brianna [name] is a 69-yo woman with PMH MDD, PTSD, presenting for urinary urgency.     Subjective  # Urinary urgency  Has been feeling a lot of urinary urgency "activation feeling." Has been setting her timer to try to wait for 2 minutes, but has to bend at the waist, usually has a spot of urine that goes through her jeans. A couple of times she has wet her her pants through. Happens 3-4 times per day.  Urinates 5-6 times per day. At night, usually urinates once.  Has been going on for 4-6 months.   Has been under more stress than usual.     Has not been coughing or sneezing, or running or jumping, so doesn't know if leaks urine then. Also has not laughed much. No pushing or straining needed when urinating, flow normal.    No history of pregnancy or childbirth.    Very rare itching or burning with urination. No vaginal discharge.     Has been doing neurofeedback for stress, finished 12 of 20 sessions. Did wet her pants once afterwards, but since then the treatment has been modified and hasn't happened. Has had constipation on and off for 2 months. Will go about 3 days without BM. Not noticed urgency worse with constipation.     Drinks a tea extract that does not contain caffeine.    Has happened once before, a few years ago, resolved when forced herself to wait. Kegel exercises have not helped.          Review of Systems   Constitutional: Negative for fever.   HENT: Negative for congestion and sore throat.    Respiratory: Negative for cough, wheezing and stridor.    Gastrointestinal: Positive for constipation. Negative for diarrhea and vomiting.   Genitourinary: Positive for urgency. Negative for flank pain.   Skin: Negative for rash.   Endo/Heme/Allergies: Negative for polydipsia.   Psychiatric/Behavioral: The patient is nervous/anxious.        Objective  BP 110/80 (Site: LA, Position: Sitting, Cuff Size: Reg)  Pulse 74  Temp 98.2 F (36.8 C) (Temporal)  Wt 53.5 kg (118 lb)  SpO2 97%  BMI 21.17  kg/m2    Physical Exam   Constitutional: She is oriented to person, place, and time. She appears well-developed and well-nourished. No distress.   HENT:   Head: Normocephalic and atraumatic.   Eyes: Pupils are equal, round, and reactive to light. Right eye exhibits no discharge. Left eye exhibits no discharge. No scleral icterus.   Cardiovascular: Normal rate, regular rhythm and normal heart sounds.    Pulmonary/Chest: Effort normal and breath sounds normal. No respiratory distress. She has no wheezes.   Abdominal: Soft. Bowel sounds are normal. She exhibits no distension. There is no rebound and no guarding.   Genitourinary:   Genitourinary Comments: Very slight suprapubic tenderness  No CVA tenderness   Neurological: She is alert and oriented to person, place, and time.   Skin: Skin is warm and dry. She is not diaphoretic.   Psychiatric: She has a normal mood and affect. Her behavior is normal.       Assessment and Plan   Brianna [name] is a 56-yo woman with PMH MDD, PTSD, presenting for urinary urgency with intermittent incontinence.    (N39.41) Urge incontinence  (R39.15) Urinary urgency  (primary encounter diagnosis)  Comment: No signs of infection. No changes in meds that would cause irritation. No changes in habits.   Plan: URINE DIPSTICK, REFERRAL TO  PHYSICAL THERAPY ( INT)        Need to rule out infections, lesions. F/u UA and culture, consider cystoscopy as below.  Pelvic exam deferred; history not suggestive of cystocele or rectocele, but can consider at next visit.    (R31.29) Other microscopic hematuria  Comment: Seen 2015 and today's dipstick. Possibly suggestive of infection vs other lesion.  Plan: POC URINALYSIS, LAB ADD ON/WRITE IN TEST, BASIC        METABOLIC PANEL        If RBCs seen on UA without infection, refer to Urology. If infection present, treat, then repeat and refer if indicated. BMP to assess any renal dysfunction.

## 2016-11-14 ENCOUNTER — Other Ambulatory Visit (HOSPITAL_BASED_OUTPATIENT_CLINIC_OR_DEPARTMENT_OTHER): Payer: Self-pay | Admitting: Family Medicine

## 2016-11-14 DIAGNOSIS — R319 Hematuria, unspecified: Principal | ICD-10-CM

## 2016-11-14 DIAGNOSIS — N3941 Urge incontinence: Secondary | ICD-10-CM

## 2016-11-14 NOTE — Progress Notes (Signed)
Left VM for Brianna [name] (pronounced "Hoffner") asking her to call the clinic. Would like to apologize to her that lab did not keep her urine sample for tests - could she please come to [facility] or any other [place] location that's convenient for her during business hours, go to the lab, and give another urine sample? Orders are placed.  Rexene Edison, MD, 11/14/2016, 12:45 PM

## 2016-11-14 NOTE — Progress Notes (Signed)
PRECEPTOR NOTE  On the day of the patient’s visit, I personally reviewed the key elements of history, physical exam, and findings as described in resident's note with the resident.    I agree with the assessment and plan as described in resident’s note.  Please see resident's note for further details.

## 2016-11-17 ENCOUNTER — Ambulatory Visit (HOSPITAL_BASED_OUTPATIENT_CLINIC_OR_DEPARTMENT_OTHER): Payer: MEDICARE | Admitting: Lab

## 2016-11-17 DIAGNOSIS — N3941 Urge incontinence: Secondary | ICD-10-CM

## 2016-11-17 DIAGNOSIS — R319 Hematuria, unspecified: Secondary | ICD-10-CM

## 2016-11-17 LAB — URINALYSIS
BILIRUBIN, URINE: NEGATIVE
CASTS: NONE SEEN PER LPF
CRYSTALS: NONE SEEN
GLUCOSE, URINE: NEGATIVE MG/DL
KETONE, URINE: NEGATIVE MG/DL
LEUKOCYTE ESTERASE: NEGATIVE
NITRITE, URINE: NEGATIVE
PH URINE: 5 (ref 5.0–8.0)
PROTEIN, URINE: NEGATIVE MG/DL
SPECIFIC GRAVITY URINE: 1.02 (ref 1.003–1.035)

## 2016-11-17 NOTE — Progress Notes (Signed)
Urine collected by Delfina.[name]

## 2016-11-18 LAB — URINE CULTURE

## 2016-11-19 ENCOUNTER — Encounter (HOSPITAL_BASED_OUTPATIENT_CLINIC_OR_DEPARTMENT_OTHER): Payer: Self-pay | Admitting: Family Medicine

## 2016-11-26 ENCOUNTER — Ambulatory Visit (HOSPITAL_BASED_OUTPATIENT_CLINIC_OR_DEPARTMENT_OTHER): Payer: MEDICARE | Admitting: Rehabilitative and Restorative Service Providers"

## 2016-11-26 DIAGNOSIS — N3941 Urge incontinence: Secondary | ICD-10-CM

## 2016-12-01 ENCOUNTER — Ambulatory Visit (HOSPITAL_BASED_OUTPATIENT_CLINIC_OR_DEPARTMENT_OTHER): Payer: MEDICARE | Admitting: Ophthalmology

## 2016-12-01 DIAGNOSIS — H26492 Other secondary cataract, left eye: Secondary | ICD-10-CM

## 2016-12-01 MED ORDER — PREDNISOLONE ACETATE 1 % OP SUSP: 1 [drp] | mL | Freq: Four times a day (QID) | OPHTHALMIC | 1 refills | 0 days | Status: AC

## 2016-12-01 MED ORDER — PREDNISOLONE ACETATE 1 % OP SUSP
1.00 [drp] | Freq: Four times a day (QID) | OPHTHALMIC | 1 refills | Status: AC
Start: 2016-12-01 — End: 2017-01-01

## 2016-12-01 NOTE — Nursing Note (Signed)
For Yag laser capsulotomy for opacified posterior capsule OS s/p cataract surgery.

## 2016-12-01 NOTE — Progress Notes (Signed)
The patient is having a Neodymium Yag laser capsulotomy of the left eye.  The risks, benefits and alternatives of Yag laser capsulotomy have been discussed, and the patient has had his/her questions answered satisfactorily.    PATIENT/PROCEDURE VERIFICATION DOCUMENTATION    Correct patient: Yes  Correct procedure: Yes  Correct side, site, mark visible if applicable: Yes  Correct position: Yes  Special equipment/implant(s) present, if applicable: Yes    Time-out completed, documented by provider doing procedure or designated team member:  Charise Carwin, MD    12/01/2016    2:28 PM    Neodymium Yag Laser Capsulotomy Record    Power 1.6 milliwatts    Number of shots 32    Drawing created? No    The patient tolerated well? Yes.    The patient was prescribed Econopred 1%, one drop to the left eye, four times a day for three days.  Follow up one week to check the intraocular pressure.    If the ophthalmologist recorded the intraocular pressure in left eye, 20 minutes after the procedure the record IOP (mm HG):done by technician.

## 2016-12-01 NOTE — Progress Notes (Signed)
The patient is having a Neodymium Yag laser capsulotomy of the left eye.  The risks, benefits and alternatives of Yag laser capsulotomy have been discussed, and the patient has had his/her questions answered satisfactorily.    Preoperative eye drops were installed by (initials) YS at (time) 2:00.  These are dilating drops (Phe 2.5%; mydriacyl 1%) and alphagan P 0.15%) to the left eye.    If the technician recorded the intraocular pressure in left eye, 20 minutes after the procedure the record IOP (mm HG):.

## 2016-12-02 NOTE — Progress Notes (Signed)
11/26/16 0900   Language Information   Language of Care English   Interpreter No   Evaluation Type   Evaluation Type Initial Evaluation   Rehab Discipline   Rehab Discipline PT   Visit   Visit number 1   Pain   Pain Score 0    Living Situation   Lives With Alone   Posture   Lordosis Moderate   Right Innominate Rotation Anterior   AROM RLE (degrees)   RLE Overall AROM Deficits  (IR and ER min decreased d/t mm tightness)   Strength RLE   RLE Overall Strength Deficits  (hip abd and ER weak 3 - to 3/5)   RLE Muscle Length Assessment   Overall Muscle Length  piriformis and obturator tight , hamstrings and psoas shortened R>L side   Lumbar/Sacroiliac results   R SI Provocation  Positive   Functional Activities/Ergonomics   Bladder Incontinent  (has leakage only at times, has urgency, started with frequen)   Palpation   Tenderness to Palpation TTP in left post pelvic mms in obturator and piriformis, in R ant pelvis more than posterior, tight fascia, especially anterior on r side   Increased Tissue Density in anterior pelvic fascia   Other transvers abdominus, compensates with rectus dabdominus,  and pelvic floor weakness, also difficulties to relax pelvic floor more than contract, described as rigid and not smooth with attemped relaxation   Patient Education   What was taught? findings, pelvic floor anatomy and function, about pelvic and hip/low back fascia, PT Rx methods and POC    Method Verbal;Demo;Practice   Patient comprehension Yes                                                      PHYSICAL THERAPY EVALUATION    61 y.o. Female patient presents to PT with complaints of urinary urgency which started about 4 years ago. Pt states that it started with frequency and then in the recent months has been more urgency. Pt describes that she has to go immediately when she gets urge.    PMHx:High Myopia, both eyes  PTSD with dissociative d/o, depression, anxiety  General fatigue, family hx of breast Ca, Vit D def., bunion,  Hx. Of Osteopenia, urge incontinence, microscopic hematuria        Physical Therapy Plan of Care    OZ:[id number] Joneen Roach, MD  Referring Provider: Rexene Edison, MD  Diagnosis: urinary urgency; urge incontinence    Assessment/Objective Findings:   Patient is a 61 year old female with complains of weakness in her bilateral Pelvic floor mms.     Pt reports onset of Urinary urgency and incontinence  due to pelvic floor weakness. Pt presently is not working, is about to travel to [place] and got concerned about the urgency a few weeks ago since she will not be at home to work around it. Pt expresses long term goal of having less frequency and urgency, no leakage, is motivated to work towards this in PT.    Clinical presentation today is consistent with pelvic floor weakness and tightness, decreased ROM r hip, decreased mobility lumbar-pelvic/sacral junction and misalignment of pelvis, decreased transverse abdominus strength and pelvic floor strength, inability to relax pelvic floor mms., urinary urgency, and pt will benefit from skilled PT focused on pelvic floor contraction, hold and relaxation, NMR  for pelvic floor, Manual rx. For decreased fascial and muscular tightness in pelvic area, spine and pelvic and LE stretching and release techniques, relaxation therex.to address the following problems and impairments noted upon evaluation: Pain, Decreased ROM, Decreased Strength, Decreased Functional Mobility, Decreased Joint Mobility and Decreased Tolerance of ADLs.     These problems limit the patient with the following functional activities: standing and walking longer, being outside and going to places, travelling. The prescribed treatment plan of care is medically necessary.      Co-morbidities of PTSD and urinary frequency  were identified and taken into considerations of plan of care.    Short Term Functional Goals: 4 weeks. to 6 weeks  Pt to have 1/2 grade at least increased pelvic floor strength  Pt to have  ability to contract and relax pelvic floor mms for 3 to 5 sec with smooth quality  Pt to be independent with HEP  Pt to have 50% decreased fascial restrictions in pelvic floor    Long Term Goal: 6 weeks.   See above, to be independent with self care      Treatment Plan:  ** PT Eval - Low Complexity (CPT 97161)  ** Stretching/ROM Exercise [phone])  ** Therapeutic Exercise (CPT 97110)  ** Home Exercise Program (CPT [phone])  ** Neuromuscular Re-education (CPT [id number])  ** Joint Mobilization (CPT 97140)  ** Soft Tissue Mobilization (CPT 97140)  ** Hot/Cold Rx (CPT 97010)  ** Functional Activities (CPT 97530)  ** Patient Education (CPT [phone])    Recommend Physical Therapy be continued 1 times per week for 6 weeks.  The rehabilitation potential for this patient is good.    Patient Regis Bill is aware of attendance policy: Yes  Plan of care discussed with Patient/Family: Yes  Patient goals reviewed and incorporated in plan of care: Yes  Patient/Family agrees with plan of care: Yes  Patient/Family education: Yes  Does patient feel safe at home: Yes      Carola Rhine, PT

## 2016-12-03 ENCOUNTER — Ambulatory Visit (HOSPITAL_BASED_OUTPATIENT_CLINIC_OR_DEPARTMENT_OTHER): Payer: Self-pay

## 2016-12-05 ENCOUNTER — Ambulatory Visit (HOSPITAL_BASED_OUTPATIENT_CLINIC_OR_DEPARTMENT_OTHER): Payer: MEDICARE | Admitting: Rehabilitative and Restorative Service Providers"

## 2016-12-05 ENCOUNTER — Encounter (HOSPITAL_BASED_OUTPATIENT_CLINIC_OR_DEPARTMENT_OTHER): Payer: Self-pay | Admitting: Family Medicine

## 2016-12-05 ENCOUNTER — Ambulatory Visit (HOSPITAL_BASED_OUTPATIENT_CLINIC_OR_DEPARTMENT_OTHER): Payer: MEDICARE | Admitting: Internal Medicine

## 2016-12-05 ENCOUNTER — Encounter (HOSPITAL_BASED_OUTPATIENT_CLINIC_OR_DEPARTMENT_OTHER): Payer: Self-pay | Admitting: Internal Medicine

## 2016-12-05 VITALS — BP 100/84 | HR 81 | Temp 98.4°F | Wt 116.0 lb

## 2016-12-05 DIAGNOSIS — N3941 Urge incontinence: Principal | ICD-10-CM

## 2016-12-05 DIAGNOSIS — K625 Hemorrhage of anus and rectum: Principal | ICD-10-CM

## 2016-12-05 DIAGNOSIS — K644 Residual hemorrhoidal skin tags: Secondary | ICD-10-CM

## 2016-12-05 NOTE — Progress Notes (Signed)
Brianna [name] 74 F pt of Dr. Lanney Gins presents with BRBPR    This AM saw bright red blood on the TP after a BM  Felt an urgency to have the BM and had to push some  Denies straining per se  Known h/o hemorrhoids with remote hematochezia but never this much blood (dime sized)  No recent GI illness  Recent neuro-feedback which she says seems to have triggered some difficulty with stools  Some soft stools alternating with constipation  No rectal or abd pain with BMs  No fever  No N/Vs  No known sick contacts  No dietary changes  Weight is relatively stable  Does not feel unwell  No known fam h/o colon CA  Last colonoscopy 09/09/06 - normal (external hemorrhoids noted)  We discuss limitations of IFOB testing  After that discussion she is interesting in repeating a colonoscopy  Travelling outside the country shortly  She will request an open colonoscopy referral on her return    Recent urinary urgency with accidents  Had urine tests 3 weeks ago with culture showing multiple bacterial morphotypes   Pt is asymptomatic  Advised against repeating a urine culture    Patient Active Problem List:     Post-traumatic stress disorder, chronic     FATIGUE GENERAL     Family history of malignant neoplasm of breast     Screening for unspecified condition     Dissociative disorder or reaction, unspecified     Major depressive disorder, recurrent episode, moderate ([place])     Bunion     Vitamin D deficiency     High myopia, both eyes     Pseudophakia     History of osteopenia     Urge incontinence     Other microscopic hematuria        Current Outpatient Prescriptions on File Prior to Visit:  prednisoLONE acetate (PRED FORTE) 1 % ophthalmic suspension Place 1 drop into the left eye 4 (four) times daily Disp: 10 mL Rfl: 1   propranolol (INDERAL) 10 MG tablet Take 1 tablet by mouth 3 (three) times daily as needed For rapid heart rate or anxiety symptoms. Disp: 90 tablet Rfl: 3   THEANINE PO Take  by mouth. Disp:  Rfl:     Multiple Vitamin (MULTIVITAMINS PO)  Disp:  Rfl:      No current facility-administered medications on file prior to visit.       Review of Patient's Allergies indicates:   Penicillins             Hives    Comment:childhood.      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  Previously in [place].  Lives alone.  Single since '05.  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      Travelling to [place] this weekend.    PE  Gen Pleasant 39 F in NAD. Good historian. Soft speech  BP 100/84 (Site: LA, Position: Sitting, Cuff Size: Reg)  Pulse 81  Temp 98.4 F (36.9 C) (Temporal)  Wt 52.6 kg (116 lb)  SpO2 98%  BMI 20.81 kg/m2      Derm: no rash  Neck supple, no cervical/clavicular/axillary LAD  OP benign  Abd: +BS, soft, NT, ND, no organomegaly  Soft external hemorrhoids at anal meatus,  rectal vault without any palpable lesions, There is brown stool in pellets which is trace guaiac positive. No frank  blood on DRE    Impression  (K62.5) BRBPR (bright red blood per rectum)  (primary encounter diagnosis)  Comment: likely from known external hemorrhoids but have encouraged pt get f/u colonoscopy which is due  Plan: she will contact me for a referral on her return from her trip    (K64.4) External hemorrhoids  Comment: reviewed dietary recs to prompt soft, regular stools  Plan: in addition to above push water and get some exercise as often as possible

## 2016-12-05 NOTE — Telephone Encounter (Signed)
Spoke with patient. Appointment booked today with Dr. Berenice Primas to address c/o urinary frequency and blood noted in stool.

## 2016-12-05 NOTE — Telephone Encounter (Signed)
Spoke with patient, appointment booked today with Dr. Berenice Primas.

## 2016-12-07 ENCOUNTER — Encounter (HOSPITAL_BASED_OUTPATIENT_CLINIC_OR_DEPARTMENT_OTHER): Payer: Self-pay | Admitting: Internal Medicine

## 2016-12-07 DIAGNOSIS — K644 Residual hemorrhoidal skin tags: Secondary | ICD-10-CM

## 2016-12-08 ENCOUNTER — Ambulatory Visit (HOSPITAL_BASED_OUTPATIENT_CLINIC_OR_DEPARTMENT_OTHER): Payer: MEDICARE | Admitting: Physician Assistant

## 2016-12-09 NOTE — Progress Notes (Signed)
12/05/16 1651   Language Information   Language of Care English   Rehab Discipline   Rehab Discipline PT   Visit   Visit number 2   Pain   Pain Score 0    Manual Therapy   Technique pelvic floor release   Ther Exercise   Exercise education regarding pelvic floor exercises   Ther Exercise 2   Exercise PF therex with less coord with breath since too difficult, practice of contr and relaxation with less force and focus on relaxation   Ther Exercise 3   Exercise 3 glut sets with PF contr and relaxation   Ther Exercise 4   Exercise 4 stretches as SKC and piriformis stretch   Patient Education   What was taught? pelvic floor contr and relaxation therex., ed re need of Manual Rx. for gascial restricitons so she can do PF therex better   Method Verbal;Demo;Practice   Patient comprehension Yes

## 2016-12-09 NOTE — Progress Notes (Signed)
S: Pt states that she has been practicing PF exercises with some difficulties to coordinate with breath and felt it was creating tightness in diaphragm area so that she has difficulties with breathing smoothly.  O: Refer to Rehabilitation Treatment Flowsheet. Changed to doing therex. With less focus on just PF since PF tightness.  A: Pt responds to rx. And is able to cont. More modified HEP as indicated on flow sheet, while she will be away.  P: cont. PT as POC. Re-assessment when Pt back the end of May.

## 2017-01-02 ENCOUNTER — Ambulatory Visit (HOSPITAL_BASED_OUTPATIENT_CLINIC_OR_DEPARTMENT_OTHER): Payer: MEDICARE | Admitting: Rehabilitative and Restorative Service Providers"

## 2017-01-02 ENCOUNTER — Ambulatory Visit (HOSPITAL_BASED_OUTPATIENT_CLINIC_OR_DEPARTMENT_OTHER): Payer: MEDICARE | Admitting: Ophthalmology

## 2017-01-02 DIAGNOSIS — H5213 Myopia, bilateral: Secondary | ICD-10-CM

## 2017-01-02 DIAGNOSIS — Z961 Presence of intraocular lens: Secondary | ICD-10-CM

## 2017-01-02 DIAGNOSIS — N3941 Urge incontinence: Principal | ICD-10-CM

## 2017-01-02 DIAGNOSIS — H26493 Other secondary cataract, bilateral: Principal | ICD-10-CM

## 2017-01-02 NOTE — Progress Notes (Signed)
Impression    1.s/p Yag laser capsulotomy for opacified posterior capsule OS-patient happy with improvement in vision.    2. Pseudophakia-with Crystalens implant OU-stable    3. Mild Opacified posterior capsule OD-will likely need Yag laser capsulotomy in future OD.    4. Moderate myopia-prior to cataract surgery. No retinal breaks or detachments

## 2017-01-02 NOTE — Nursing Note (Signed)
x1 mth S/P Yag Cap OS / PC IOL OU  Va OS improved   C/o since laser seeing  floaters doesnt know which eye , no flashes

## 2017-01-07 NOTE — Progress Notes (Signed)
S: Pt states that she has been away on a trip in [place], and that she has been much better since she has been more relaxed, and Pt states that she has not had any urgency or incontinence.  O: Refer to Rehabilitation Treatment Flowsheet.Added LE stretching and more core strengthening for HEP. Pt reponds well to Rx./shows good understanding and performance of exercises.  A: Pt responds to rx. And is able to cont. HEP, until another f/u appt in 4 to 6 weeks.  P: cont. PT as POC as mentioned above, at least 1 more visit.    Carola Rhine, PT, Lic # [id number]

## 2017-01-07 NOTE — Progress Notes (Signed)
01/02/17 1200   Language Information   Language of Care English   Rehab Discipline   Rehab Discipline PT   Visit   Visit number 3   Pain   Pain Score 0    Manual Therapy   Technique pelvic floor release   Manual Therapy 2   Technique L-S decompression and L-S area release   Ther Exercise   Exercise education regarding pelvic floor exercises   Ther Exercise 2   Exercise 4-ped pelvic tilrt and PT contr/release   Ther Exercise 3   Exercise 3 glut sets with PF contr and relaxation   Ther Exercise 4   Exercise 4 stretches as SKC and piriformis stretch   Ther Exercise 5   Exercise 5 hamstring stretches and SKC/DKC added for pelvic release   Patient Education   What was taught? pelvic floor contr and relaxation therex., ed re need of Manual Rx. for gascial restricitons so she can do PF therex better   Method Verbal;Demo;Practice   Patient comprehension Yes     Carola Rhine, PT, Lic # [id number]

## 2017-01-13 ENCOUNTER — Ambulatory Visit (HOSPITAL_BASED_OUTPATIENT_CLINIC_OR_DEPARTMENT_OTHER): Payer: MEDICARE | Admitting: Family Medicine

## 2017-01-13 VITALS — BP 110/80 | HR 84 | Temp 97.5°F | Wt 112.0 lb

## 2017-01-13 DIAGNOSIS — K625 Hemorrhage of anus and rectum: Secondary | ICD-10-CM

## 2017-01-13 NOTE — Progress Notes (Signed)
SUBJECTIVE  Brianna [name] is a 61 year old English-speaking female patient of Brianna Kissick MD who presents for a visit to discuss possible need for colonoscopy.    - had some BRB per rectum in March; was seen and noted to have some hemorrhoids and recommended to get a colonoscopy  - last colonoscopy in 2008  - neg IFOB in 10/2016 but pos hemoccult in 11/2016  - no further rectal bleeding  - she is nervous about having a colonoscopy b/c she found the bowel prep unpalatable      -- wants to know re the shingles vaccine    Review of Systems  Pertinent positive and negative ROS as noted above. All other systems were reviewed and negative.    Past Medical History:  No date: Anxiety  No date: Astigmatism  No date: Cataract  BMD 8/04: Disorder of bone and cartilage, unspecified      Comment: Osteopenia only, femoral neck.  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 MAH:                EGD, Cscopy 09/09/06: all normal. 2/08: rslvd.  No date: Major depressive disorder, single episode, mod*  08/11/2002: Menopause  No date: Myopia  No date: Presbyopia  No date: Thyroid disease  No date: Wears eyeglasses    Allergies, medications, and problems reviewed in EpicCare.    OBJECTIVE  VS:  BP 110/80 (Site: LA, Position: Sitting, Cuff Size: Reg)  Pulse 84  Temp 97.5 F (36.4 C)  Wt 50.8 kg (112 lb)  SpO2 96%  BMI 20.1 kg/m2  GEN:    Well appearing   NEURO:   Alert and oriented, CN II-XII grossly intact, normal speech and gait    ASSESSMENT AND PLAN    (K62.5) Hemorrhage of rectum and anus  Comment:   Plan:  - reviewed newer option for bowel prep which she feels willing to try  - REFERRAL TO OPEN ACCESS COLONOSCOPY     Discussed shingles vaccine - she will go to pharmacy to get this          _______________    Beverly Gust MD, 01/13/2017, 9:32 AM

## 2017-01-14 ENCOUNTER — Telehealth (HOSPITAL_BASED_OUTPATIENT_CLINIC_OR_DEPARTMENT_OTHER): Payer: Self-pay

## 2017-01-14 ENCOUNTER — Encounter (HOSPITAL_BASED_OUTPATIENT_CLINIC_OR_DEPARTMENT_OTHER): Payer: Self-pay

## 2017-01-14 NOTE — Progress Notes (Signed)
Called patient to schedule a colonoscopy.  Unable to reach patient, left message to call back [phone] to schedule.

## 2017-01-16 ENCOUNTER — Other Ambulatory Visit (HOSPITAL_BASED_OUTPATIENT_CLINIC_OR_DEPARTMENT_OTHER): Payer: Self-pay

## 2017-01-16 ENCOUNTER — Encounter (HOSPITAL_BASED_OUTPATIENT_CLINIC_OR_DEPARTMENT_OTHER): Payer: Self-pay

## 2017-01-16 MED ORDER — PEG 3350-KCL-NA BICARB-NACL 420 G PO SOLR: 4000 mL | mL | Freq: Once | ORAL | 0 refills | 0 days | Status: AC

## 2017-01-16 MED ORDER — PEG 3350-KCL-NA BICARB-NACL 420 G PO SOLR
4000.0000 mL | Freq: Once | ORAL | 0 refills | Status: AC
Start: 2017-01-16 — End: 2017-01-16

## 2017-01-16 NOTE — Progress Notes (Signed)
Spoke with patient to schedule a colonoscopy for +BRBPR.  Procedure/preparation and need for ride explained.  Medical history reviewed.  Patient requested procedure be at [facility] for convenience, scheduled for colonoscopy with moderate sedation with Dr Mamie Levers on 03/06/17 to arrive at 8am @ [facility].    Colonoscopy Standard       Division of Gastroenterology  [facility]  Phone:__________________  Address: [address], [place], [place] [address]    PLEASE READ CAREFULLY OR HAVE SOMEONE READ THIS TO YOU!      ____________________________________________  Name    Your Colonoscopy test is scheduled at _____________ Hospital    Day_______________     Time to Arrive _______________    Register ______________________            Dennis Bast are having a colonoscopy.  This test lets the doctor see the inside your large intestine (also called your colon) using a small camera on a flexible tube.  There are several reasons to have the test:   *Screening for colon cancers and polyps    *To check unexplained changes in bowel habits   *To evaluate abnormal X-ray findings   *To look for bleeding ulcers or other abnormalities of the colon lining                 HOW TO GET READY FOR THE TEST      Your prescription was sent to your pharmacy or given to you.    Please pick up your prescription within 1 week of receiving these instructions.     IF YOU ARE USING NULYTELY OR A DRUG LIKE IT, DO NOT ADD WATER TO THE PRESCRIPTION UNTIL 1 DAY BEFORE THE DAY OF YOUR EXAM.    Your prescription was sent to: _______________________________________    Call a friend or a family member to make sure you have   someone to take you home after your test.   You must have someone to go home with after the test or we will not be able to give you sedatives!      If you have any questions or worries, or if you are unsure about how to prepare for this test, please call _____________                                  DAY OF THE TEST    4 to 5  hours before your test, drink the remaining half of the bottle of laxative.    It is very important to finish the WHOLE [place].    The morning of the test, you can take your other medications at the usual time with a sip of water. These include blood pressure pills, seizure medications, heart medications, thyroid medications, etc).     Don't take pills for diabetes. Bring these medicines with you so that you can take them right after your test.      NOTHING to drink for 3 hours before the test.      IMPORTANT INFORMATION About Your Medicines  Based On Recommendations From Experts    IF YOU HAVE ANY CONCERNS ABOUT YOUR MEDICATIONS [place]. DO NOT WAIT UNTIL THE DAY BEFORE THE TEST!!!!!      Your Medications  You can take your medications for high blood pressure, heart problems, or anxiety with a sip of water at your usual time.  Bring a list of your medications with  you.     If you take medicines for Type 2 Diabetes:    One day before the test: If you are taking diabetes pills: Take PILLS in the morning only. If you take morning insulin: take your usual dose.    On the night before the test: Do not take diabetes pills.  If you take insulin for type 2 diabetes, take  your usual long acting insulin.  For example: if you usually take 40 units of Lantus or NPH, take 20 units instead.    On the morning of the test: Do not take diabetes pills. If you are on long-acting insulin for type 2 diabetes, you can take half the dose. For example if you are on 40 units of Lantus or NPH, take 20 units instead.     After the test: You will be allowed to eat normally again. At that time, you should resume taking your diabetes pills at your usual times. If on insulin, take your usual evening dose of insulin. If you can't eat for any reason, check with your doctor.    If you have Type 1 Diabetes:    One day before the test: Take your usual long-acting basal insulin (Lantus or NPH) and make sure your clear liquid diets  contain sugar. Check your blood sugars at meal times and cover with short acting insulin only if blood sugar is over 200. Otherwise do not take your short-acting insulin.    On the night before the test: Just take your usual basal insulin dose that you would normally take at that time (for example, your usual full dose of Lantus or NPH).    On the morning of the test: Take your usual basal insulin dose that you would normally take at that time (for example, your usual full dose of Lantus or NPH).                            Test Checklist    Please use this list to prepare for your test.     7 days before the test (____/____/____)     STOP taking Motrin, Advil, Naprosyn, Aleve or related drugs.   Continue to take all other prescribed meds.   Ask your doctor about whether you should continue Aspirin. It is sometimes        important to stay on this medication.    3 days before the test (____/____/____)     STOP eating any foods that contain beans, seeds, skins, nuts, or are high in fiber.    2 days before the test (____/____/____)     MAKE SURE YOU HAVE A RIDE ARRANGED    EAT dinner no later than 7 pm.  BEGIN a liquid diet. (Do not eat solids. This includes foods such as rice, pasta, bread and fruit).    1 day before the test (____/____/____)     PREPARE the laxative.   START taking the laxative at 6:00pm.   DRINK  the bottle of the laxative.    Day of your test (____/____/____)     FINISH  the rest of the laxative 5 to 6 hours before your test.     DO NOT drink anything 3 hours before the test.      WHAT TO EXPECT ON THE DAY OF YOUR TEST    Colonoscopy  Colonoscopy is a procedure used to see inside the colon and rectum.  During a colonoscopy a  flexible tube with a camera and a light is inserted through the rectum. The doctor then examines the large bowel (also called the large intestine or colon).    Colonoscopies can detect inflamed tissue, ulcers and abnormal growths called polyps. Some polyps are cancerous, but  most are "pre cancerous". These might become dangerous someday. A doctor can usually remove these polyps during the test. They are then sent to a laboratory to be checked. Polyps are common in adults and are generally harmless. However, most colorectal cancers begin as polyps. Removing them is a good way to prevent cancer. Your doctor may also take biopsies (small pieces of tissue) for analysis in the lab of abnormalities that they want to check in more detail.    It is extremely important to follow all of the steps for cleaning out your colon. If you do not follow all the steps, the doctor may not be able to see clearly. The exam might need to be cancelled and repeated another day. The clear liquids you can take during the clean out are treated as food by your body, so you won't starve or get dehydrated.        Getting Ready At [facility]  On the day of your test, a nurse and doctor will ask you some more information about your health history.  You will be put into a hospital gown. A small intravenous needle will be inserted in the back of your hand or forearm to give you medications that will make you comfortable during the test.    In the procedure room, you will be asked to lie down in a curled position on your left side.  Please inform the doctor if this is uncomfortable for you. You will be given oxygen to breathe. The test usually lasts 30 minutes to an hour. It may last longer if polyps need to be removed or if other abnormalities are noted.      You will be given medication through the IV in order to control discomfort and help you relax.  You may sleep or be partially awake during the test. Sometimes, you might feel a cramping sensation. We will monitor you and try to make you as comfortable as possible. The tube will be inserted into your rectum (back side) and advanced through the large bowel.  The doctor will try and look at all of the inner walls of your colon. The outer wall and organs outside the  colon are not visible with this test.    Possible Complications  Complications are unusual during or after the test but they can happen. The most common risks include colonic perforation (a tear in the colon), bleeding, respiratory problems, blood pressure problems, heart problems, discomfort and adverse reactions to the medications used.  A perforation may result in the need for emergency surgery and a colostomy bag. Also note, colonoscopy like other medical tests is not perfect. It may not detect problems such as polyps, cancers and other diseases up to 2 to 6% of the time. Luckily, the combined risk of all of these problems is small.    After the Test  You may feel bloated from air which was put into your colon during the test. You may also feel a little drowsy from the medications. You cannot drive or operate heavy machinery or do any important work for the rest of the day. You should plan on resting, watching TV or reading light material after the test. You may forget things  that happen during and directly after the test. It is important have someone with you that can remind you of any instructions we give.    Depending on what is found, you may need to have the colonoscopy repeated. Usually, this is done several years later but it may need to be done much sooner. Talk to your doctor about when you should have repeat study or other testing.    You will usually be at the hospital between 2-3 hours (although sometimes it can take longer).  We will make sure that you are alright before sending you home. You must arrange for someone to drive you home after the test.  Once again, we will not perform the test unless you have an arranged ride. You cannot go home in a taxi or a bus.    Colonoscopy is a safe and effective test that is commonly done at our facilities. You may receive a call to remind you of the date and time of your test. If you have any questions, please feel free to call.     For questions about the  test itself, call [phone].    For questions about the date and time of your test, or to change the date or time, call [phone]    For questions regarding your regular medications or health issues, please call your doctor.

## 2017-01-20 NOTE — Dental Note (Signed)
Patient presented for an amalgam restoration #18, 19 and 30, .    Patient pain level at beginning of appointment: 0    Interpreter: None    *Medical/Abryana Lykens History*  Chief complaint:  HPI:  Medical history reviewed and updated.    *Amalgam Restoration*  Reason(s) for restoration(s): Fractured tooth  Radiographs taken:  Radiographs taken to evaluate for:  Local anesthetic used: 4% Articaine (1:100,000 epinephrine) and 2% Lidocaine  (1:100,000 epinephrine)  Whole number of carpules used: 2  Type of injection: Mandibular infiltration and Inferior alveloar nerve block  Existing material removed: Amalgam  Liner and base placed: Lime-Lite    The tooth was prepared with adequate water irrigation. GS-80 fast set amalgam  was placed, condensed, and burnished. Restoration was carved to ideal  anatomy. Margins, contacts, and occlusion were checked and adjusted as  needed. Post-op instructions given. Patient instructed not to bite on this   side   of the mouth for at least 24 hours.    *Assessment*  Prescription given: None  Referral: Not given  Assessment/Plan:    Patient pain level at end of appointment: 0    Patient left ambulatory, satisfied with treatment. Any remaining planned  treatments were explained in detail to the patient.    Assistant: Noralee Space    Next visit: What:recall  Reason:    ----- Signed on Wednesday, December 03, 2016 at 4:35:28 PM  -----  ----- Provider: Roby Lofts - Noralee Space,  -- Clinic: [facility] -----  ----- Override Provider: Roney Jaffe, DDS -----

## 2017-01-23 ENCOUNTER — Encounter (HOSPITAL_BASED_OUTPATIENT_CLINIC_OR_DEPARTMENT_OTHER): Payer: Self-pay | Admitting: Family Medicine

## 2017-01-23 NOTE — Telephone Encounter (Signed)
Failed attempt to contact pt to f/up on her last my-chart msg.   Apt to be offered on Saturday UC if pt would like.   Left detailed voicemail to return call to [phone].  Awaiting call back.     Ashawn Rinehart Colon-Guerrero, RN, 01/23/2017, 3:57 PM

## 2017-01-26 ENCOUNTER — Ambulatory Visit (HOSPITAL_BASED_OUTPATIENT_CLINIC_OR_DEPARTMENT_OTHER): Payer: MEDICARE | Admitting: Family Medicine

## 2017-01-26 ENCOUNTER — Encounter (HOSPITAL_BASED_OUTPATIENT_CLINIC_OR_DEPARTMENT_OTHER): Payer: Self-pay | Admitting: Family Medicine

## 2017-01-26 VITALS — BP 118/78 | HR 76 | Temp 97.9°F | Wt 112.0 lb

## 2017-01-26 DIAGNOSIS — R5381 Other malaise: Secondary | ICD-10-CM

## 2017-01-26 DIAGNOSIS — R7989 Other specified abnormal findings of blood chemistry: Secondary | ICD-10-CM

## 2017-01-26 DIAGNOSIS — R278 Other lack of coordination: Secondary | ICD-10-CM

## 2017-01-26 DIAGNOSIS — R945 Abnormal results of liver function studies: Secondary | ICD-10-CM

## 2017-01-26 DIAGNOSIS — Z1159 Encounter for screening for other viral diseases: Secondary | ICD-10-CM

## 2017-01-26 DIAGNOSIS — R5383 Other fatigue: Secondary | ICD-10-CM

## 2017-01-26 DIAGNOSIS — R21 Rash and other nonspecific skin eruption: Secondary | ICD-10-CM

## 2017-01-26 LAB — RBC SEDIMENTATION RATE: RBC SEDIMENTATION RATE: 4 MM/HR (ref 0–30)

## 2017-01-26 NOTE — Progress Notes (Signed)
URGENT CARE VISIT    SUBJECTIVE  Brianna [name] is a 61 year old English-speaking female patient of Johnathon Mittal MD who presents for an urgent visit c/o:    -- 10 days ago suddenly started getting itching of her low back and thought she could feel a bump which made her think she had an insect bite; unclear if any rash initially; she is surprised that she still has intermittent itching  - has felt a lot of fatigue and clumsiness for a long time but thinks this might have intensified around the time the itching started; by clumsiness she means tripping, mis-stepping or not navigating turns correctly; denies any weakness/vision changes or inability to complete activities of daily living  - has had muscle cramps of her legs and joint pains/stiffness of her ankles that started around the time of the itching  - no fevers  - no tremors/numbness    - spends a fair amount of time outside; wonders if she might have gotten Lyme - especially since sx's have been longstanding    - has occasional feelings of being down; no recent SI        Review of Systems  Pertinent positive and negative ROS as noted above. All other systems were reviewed and negative.    Past Medical History:  No date: Anxiety  No date: Astigmatism  No date: Cataract  BMD 8/04: Disorder of bone and cartilage, unspecified      Comment: Osteopenia only, femoral neck.  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 MAH:                EGD, Cscopy 09/09/06: all normal. 2/08: rslvd.  No date: Major depressive disorder, single episode, mod*  08/11/2002: Menopause  No date: Myopia  No date: Presbyopia  No date: Thyroid disease  No date: Wears eyeglasses    Allergies, medications, and problems reviewed in EpicCare.    OBJECTIVE  VS:  BP 118/78 (Site: RA, Position: Sitting, Cuff Size: Reg)  Pulse 76  Temp 97.9 F (36.6 C) (Temporal)  Wt 50.8 kg (112 lb)  SpO2 97%  BMI 20.1 kg/m2  GEN:    Well appearing   CV:     RRR, nlS1S2, no MRG  CHEST:   CTAB, no WRR  EXTR:   Warm, no edema; no redness or swelling of ankles or knees  NEURO:   Alert and oriented, CN II-XII grossly intact, normal speech and gait; normal strength and sensation to light touch bilaterally; normal rapid alternating movements/finger-nose and heel-toeing; normal rhombergs    ASSESSMENT AND PLAN  (R53.81,  R53.83) Malaise and fatigue  (primary encounter diagnosis)  Comment: likely multifactorial in etiology  Plan:   - for now, check labs including Lyme, B12, ESR    (R21) Rash  Comment: very non specific - does not look like Erythema migrans  Plan:   - reviewed that this is unlikely EM and that if it were more pathognomic one would treat for Lyme empirically b/c serologies take time following EM to become positive   - reviewed that although it isn't a typical EM rash,  I cannot r/o that this is an unusual presentation of EM and given her extensive outdoors hx, it is not unreasonable to  treat her with doxy; she would like to hold on this  - discussed at length and agreed that given her long standing sx's it is reasonable to test for Lyme  now; if sx's persist, we could repeat this test in ~ 6 weeks, in case this rash was an unusual variant of EM    (R27.8) Clumsiness  Comment: no objective cerebellar deficits today  Plan:   - FU immediately if any worsening or change in sx's         _______________    Beverly Gust MD, 01/26/2017, 2:27 PM

## 2017-01-26 NOTE — Telephone Encounter (Signed)
Spoke with patient and she was not able to reach Dr. Ned Grace message because her account was inactive.  Gave patient information on how to reactivate her mychart account.  Read first paragraph of mychart message and scheduled appointment for patient to see Dr. Dorna Leitz.

## 2017-01-26 NOTE — Telephone Encounter (Signed)
-----   Message from [facility] [place] sent at 01/26/2017  9:35 AM EDT -----  Regarding: Returning Nurse Call   Contact: [phone]  Brianna [name],  [id number],  61 year old,  female  Telephone Information:  Home Phone      [phone]  Work Phone      [phone]  Mobile          [phone]    Patient's PCP: Beverly Gust MD    Patient's language of care:   English  Caller does not need an interpreter.    Person calling on behalf of patient: Patient (self)     Calls today returning nurse call. Unable to access [employer] sent.    CALL BACK NUMBER: [phone]

## 2017-01-28 ENCOUNTER — Ambulatory Visit (HOSPITAL_BASED_OUTPATIENT_CLINIC_OR_DEPARTMENT_OTHER): Payer: MEDICARE | Admitting: Lab

## 2017-01-28 ENCOUNTER — Encounter (HOSPITAL_BASED_OUTPATIENT_CLINIC_OR_DEPARTMENT_OTHER): Payer: Self-pay | Admitting: Family Medicine

## 2017-01-28 LAB — LYME IGG/IGM LINE BLOT REFLEX
LYME AB TOTAL IMMUNOGLOBULINS: <0.91 {ISR} (ref 0.00–0.90)
LYME DISEASE AB QUANT IGM: <0.8 index (ref 0.00–0.79)

## 2017-01-30 LAB — COMPREHENSIVE METABOLIC PANEL
ALANINE AMINOTRANSFERASE: 62 U/L — ABNORMAL HIGH (ref 12–45)
ALBUMIN: 4 g/dL (ref 3.4–5.0)
ALKALINE PHOSPHATASE: 74 U/L (ref 45–117)
ANION GAP: 7 mmol/L (ref 5–15)
ASPARTATE AMINOTRANSFERASE: 38 U/L — ABNORMAL HIGH (ref 8–34)
BILIRUBIN TOTAL: 0.2 mg/dL (ref 0.2–1.0)
BUN (UREA NITROGEN): 17 mg/dL (ref 7–18)
CALCIUM: 9.1 mg/dL (ref 8.5–10.1)
CARBON DIOXIDE: 28 mmol/L (ref 21–32)
CHLORIDE: 108 mmol/L — ABNORMAL HIGH (ref 98–107)
CREATININE: 0.8 mg/dL (ref 0.4–1.2)
ESTIMATED GLOMERULAR FILT RATE: >60 mL/min (ref >60)
Glucose Random: 97 mg/dL (ref 74–160)
POTASSIUM: 4 mmol/L (ref 3.5–5.1)
SODIUM: 143 mmol/L (ref 136–145)
TOTAL PROTEIN: 7.1 g/dL (ref 6.4–8.2)

## 2017-01-30 LAB — FERRITIN: FERRITIN: 53 ng/mL (ref 8–252)

## 2017-01-30 LAB — C-REACTIVE PROTEIN: C-REACTIVE PROTEIN: <0.3 mg/dL (ref 0.0–1.8)

## 2017-01-30 LAB — GAMMA GLUTAMYL TRANSFERASE: GAMMA GLUTAMYL TRANSFERASE: 34 IU/L (ref 5–85)

## 2017-01-30 LAB — HEPATITIS B SURFACE ANTIGEN: HEPATITIS B SURFACE ANTIGEN: NONREACTIVE

## 2017-01-30 LAB — HEPATITIS B SURFACE AB QUANT: HEPATITIS B SURFACE AB QUANT: <3.1 m[IU]/mL (ref 0.00–9.99)

## 2017-01-30 LAB — VITAMIN B12: VITAMIN B12: >2000 pg/mL — ABNORMAL HIGH (ref 193–986)

## 2017-01-30 LAB — HEPATITIS C ANTIBODY: HEPATITIS C ANTIBODY: NONREACTIVE

## 2017-02-16 ENCOUNTER — Encounter (HOSPITAL_BASED_OUTPATIENT_CLINIC_OR_DEPARTMENT_OTHER): Payer: Self-pay | Admitting: Rehabilitative and Restorative Service Providers"

## 2017-02-20 ENCOUNTER — Ambulatory Visit (HOSPITAL_BASED_OUTPATIENT_CLINIC_OR_DEPARTMENT_OTHER): Payer: MEDICARE | Admitting: Rehabilitative and Restorative Service Providers"

## 2017-02-25 ENCOUNTER — Ambulatory Visit (HOSPITAL_BASED_OUTPATIENT_CLINIC_OR_DEPARTMENT_OTHER): Payer: MEDICARE | Admitting: Family Medicine

## 2017-02-25 VITALS — BP 118/70 | HR 70 | Temp 98.0°F | Wt 110.0 lb

## 2017-02-25 DIAGNOSIS — R7989 Other specified abnormal findings of blood chemistry: Principal | ICD-10-CM

## 2017-02-25 DIAGNOSIS — R5381 Other malaise: Secondary | ICD-10-CM

## 2017-02-25 DIAGNOSIS — Z789 Other specified health status: Secondary | ICD-10-CM

## 2017-02-25 DIAGNOSIS — R5383 Other fatigue: Secondary | ICD-10-CM

## 2017-02-25 DIAGNOSIS — B1910 Unspecified viral hepatitis B without hepatic coma: Secondary | ICD-10-CM

## 2017-02-25 LAB — COMPREHENSIVE METABOLIC PANEL
ALANINE AMINOTRANSFERASE: 62 U/L — ABNORMAL HIGH (ref 12–45)
ALBUMIN: 4 g/dL (ref 3.4–5.0)
ALKALINE PHOSPHATASE: 87 U/L (ref 45–117)
ANION GAP: 7 mmol/L (ref 5–15)
ASPARTATE AMINOTRANSFERASE: 54 U/L — ABNORMAL HIGH (ref 8–34)
BILIRUBIN TOTAL: 0.5 mg/dL (ref 0.2–1.0)
BUN (UREA NITROGEN): 19 mg/dL — ABNORMAL HIGH (ref 7–18)
CALCIUM: 8.9 mg/dL (ref 8.5–10.1)
CARBON DIOXIDE: 29 mmol/L (ref 21–32)
CHLORIDE: 105 mmol/L (ref 98–107)
CREATININE: 0.8 mg/dL (ref 0.4–1.2)
ESTIMATED GLOMERULAR FILT RATE: >60 mL/min (ref >60)
Glucose Random: 79 mg/dL (ref 74–160)
POTASSIUM: 4.7 mmol/L (ref 3.5–5.1)
SODIUM: 141 mmol/L (ref 136–145)
TOTAL PROTEIN: 7.2 g/dL (ref 6.4–8.2)

## 2017-02-25 LAB — LOW DENSITY LIPOPROTEIN DIRECT: LOW DENSITY LIPOPROTEIN DIRECT: 109 mg/dL (ref 0–189)

## 2017-02-25 LAB — HIGH DENSITY LIPOPROTEIN: HIGH DENSITY LIPOPROTEIN: 66 mg/dL (ref 40–?)

## 2017-02-25 LAB — CHOLESTEROL: Cholesterol: 176 mg/dL (ref 0–239)

## 2017-02-25 NOTE — Progress Notes (Signed)
SUBJECTIVE  Brianna [name] is a 61 year old English-speaking female patient of Jylan Loeza MD who presents for a FU.      -- elevated LFTs  - stopped drinking Kava tea a month ago    -- malaise  - feeling less run down recently    -- tick exposure  - saw a tick in her house last mth; outdoors a lot; no known tick bite  - Lyme last mth normal but had a new onset rash; thinks she'd like to check again since 4 weeks post rash          Review of Systems  Pertinent positive and negative ROS as noted above. All other systems were reviewed and negative.    Past Medical History:  No date: Anxiety  No date: Astigmatism  No date: Cataract  BMD 8/04: Disorder of bone and cartilage, unspecified      Comment: Osteopenia only, femoral neck.  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 MAH:                EGD, Cscopy 09/09/06: all normal. 2/08: rslvd.  No date: Major depressive disorder, single episode, mod*  08/11/2002: Menopause  No date: Myopia  No date: Presbyopia  No date: Thyroid disease  No date: Wears eyeglasses    Allergies, medications, and problems reviewed in EpicCare.    OBJECTIVE  VS:  BP 118/70  Pulse 70  Temp 98 F (36.7 C)  Wt 49.9 kg (110 lb)  SpO2 98%  BMI 19.74 kg/m2  GEN:    Well appearing   SKIN:  Slightly rough skin behind her bra strap  NEURO:   Alert and oriented, CN II-XII grossly intact, normal speech and gait    ASSESSMENT AND PLAN    (R79.89) Elevated LFTs  (primary encounter diagnosis)  Comment: mildly elevated; no known risk factors except use of Kava, which she stopped taking a month ago  Plan:   - repeat COMPREHENSIVE METABOLIC PANEL, HEMOGLOBIN A1C, LOW DENSITY LIPOPROTEIN DIRECT, CHOLESTEROL, HIGH DENSITY LIPOPROTEIN            (R53.81,  R53.83) Malaise and fatigue  Comment: improving; does have risk for Lyme, however, and would like testing  Plan:   - repeat LYME IGG/IGM RFLX WESTERN BLOT            (B19.10) Not immune to  hepatitis B virus  Comment:   Plan:   - she would rather not get vaccinated at this time            _______________    Beverly Gust MD, 02/25/2017, 8:22 AM

## 2017-02-26 LAB — HEMOGLOBIN A1C
ESTIMATED AVERAGE GLUCOSE: 123 (ref 74–160)
HEMOGLOBIN A1C: 5.9 % — ABNORMAL HIGH (ref 4.0–5.6)

## 2017-02-27 ENCOUNTER — Ambulatory Visit (HOSPITAL_BASED_OUTPATIENT_CLINIC_OR_DEPARTMENT_OTHER): Payer: MEDICARE | Admitting: Rehabilitative and Restorative Service Providers"

## 2017-02-27 DIAGNOSIS — N3941 Urge incontinence: Secondary | ICD-10-CM

## 2017-02-27 LAB — LYME IGG/IGM LINE BLOT REFLEX
LYME AB TOTAL IMMUNOGLOBULINS: <0.91 {ISR} (ref 0.00–0.90)
LYME DISEASE AB QUANT IGM: <0.8 index (ref 0.00–0.79)

## 2017-03-04 ENCOUNTER — Encounter (HOSPITAL_BASED_OUTPATIENT_CLINIC_OR_DEPARTMENT_OTHER): Payer: Self-pay | Admitting: Family Medicine

## 2017-03-06 ENCOUNTER — Ambulatory Visit (HOSPITAL_BASED_OUTPATIENT_CLINIC_OR_DEPARTMENT_OTHER): Admit: 2017-03-06 | Payer: Self-pay | Source: Ambulatory Visit | Admitting: Surgery

## 2017-03-06 NOTE — Progress Notes (Signed)
02/27/17 1210   Language Information   Language of Care English   Rehab Discipline   Rehab Discipline PT   Visit   Visit number 4   Pain   Pain Score 0    Manual Therapy   Technique pelvic floor release   Manual Therapy 2   Technique L-S decompression and L-S area release   Ther Exercise   Exercise education regarding pelvic floor exercises   Ther Exercise 2   Exercise 4-ped pelvic tilt and PT contr/release   Ther Exercise 3   Exercise 3 glut sets with PF contr and relaxation   Ther Exercise 4   Exercise 4 stretches as SKC and piriformis stretch   Ther Exercise 5   Exercise 5 hamstring stretches and SKC/DKC added for pelvic release   Patient Education   What was taught? HEP reveiwed   Method Verbal;Demo;Practice   Patient comprehension Yes     Carola Rhine, [employer], Lic # [id number]

## 2017-03-06 NOTE — Progress Notes (Signed)
S: Pt states that she has been better overall, but that she needs to review exercises, t5hat she feels frequency at times, but not as bad as when she first started Rx.  O: Refer to [place].Added LE stretching and more core strengthening for HEP. Pt reponds well to Rx./shows good understanding and performance of exercises.  A: Pt responds to rx.   P: cont. PT as POC. 2 to 3 more appts added. Recert at 27MB visit.    Carola Rhine, PT, Lic # [id number]

## 2017-03-18 ENCOUNTER — Ambulatory Visit (HOSPITAL_BASED_OUTPATIENT_CLINIC_OR_DEPARTMENT_OTHER): Payer: MEDICARE | Admitting: Rehabilitative and Restorative Service Providers"

## 2017-03-18 DIAGNOSIS — N3941 Urge incontinence: Principal | ICD-10-CM

## 2017-03-18 NOTE — Progress Notes (Signed)
S: Pt states that she has been better overall, but in last 2 to 3 days had some leakage and possibly more urgency. Pt states that she was on a long train ride and that she might also be more tight from that. Also had more coffee when she was away and wonders if that has to do with it.  O: Refer to Rehabilitation Treatment Flowsheet. Pt responded well to Manual Rx., and had more fascial tightness in post more than ant pelvis, more on L slightly. Pt shows good understanding of concept of doing HEP for preventing tightness and frequency as well as any leakage. Pt reponds well to Rx./shows good understanding and performance of exercises. Added foam roller releases for post pelvis which Pt felt good with. Might get FR or start with tennis ball at home.  A: Pt responds to rx.   P: cont. PT as POC. 1 to 2  more visit most likely    [employer], PT, Lic # [id number]

## 2017-03-18 NOTE — Progress Notes (Signed)
03/18/17 1400   Language Information   Language of Care English   Rehab Discipline   Rehab Discipline PT   Visit   Visit number 5   Pain   Pain Score 0    Manual Therapy   Technique pelvic floor release   Manual Therapy 2   Technique L-S decompression and L-S area release   Manual Therapy 3   Technique TP release in post pelvis in SL   Ther Exercise   Exercise education regarding pelvic floor exercises  (to do in H/L and sitting 20 to 30 reps/day, 3 to 5x/week)   Ther Exercise 2   Exercise 4-ped pelvic tilt and PT contr/release   Ther Exercise 3   Exercise 3 glut sets with PF contr and relaxation   Ther Exercise 4   Exercise 4 stretches as SKC and piriformis stretch   Ther Exercise 5   Exercise 5 hamstring stretches and SKC/DKC added for pelvic release   Ther Exercise 6   Exercise 6 foam roller post pelvis at wall and on floor, also ed re tennis ball for post. pelvic release   Patient Education   What was taught? HEP reveiwed and added onto   Method Verbal;Demo;Practice   Patient comprehension Yes     Carola Rhine, PT, Lic # [id number]

## 2017-03-20 ENCOUNTER — Ambulatory Visit (HOSPITAL_BASED_OUTPATIENT_CLINIC_OR_DEPARTMENT_OTHER): Payer: MEDICARE | Admitting: Rehabilitative and Restorative Service Providers"

## 2017-03-23 ENCOUNTER — Encounter (HOSPITAL_BASED_OUTPATIENT_CLINIC_OR_DEPARTMENT_OTHER): Payer: Self-pay | Admitting: Ophthalmology

## 2017-04-06 ENCOUNTER — Ambulatory Visit (HOSPITAL_BASED_OUTPATIENT_CLINIC_OR_DEPARTMENT_OTHER): Payer: MEDICARE | Admitting: Rehabilitative and Restorative Service Providers"

## 2017-04-06 ENCOUNTER — Encounter (HOSPITAL_BASED_OUTPATIENT_CLINIC_OR_DEPARTMENT_OTHER): Payer: Self-pay | Admitting: Family Medicine

## 2017-04-06 VITALS — BP 110/76 | Temp 99.3°F | Wt 111.0 lb

## 2017-04-06 DIAGNOSIS — J029 Acute pharyngitis, unspecified: Secondary | ICD-10-CM

## 2017-04-06 LAB — RAPID STREP (POINT OF CARE): STREP SCREEN: NEGATIVE

## 2017-04-06 NOTE — Patient Instructions (Signed)
Rapid strep test was negative.  Will send for culture and call you if any bacteria grows.  May develop more congestion and then a cough.    Supportive treatment - Viral Illness:  1) Ibuprofen 600mg  every 8 hours with food or tylenol 650mg  every 4-6 hours (not to exceed 4 grams/day) for pain and fever.  2) Pseudoephedrine (Sudafed) as a decongestant - can cause effects like caffeine, consider avoiding taking near bedtime.  Also steam showers, nasal saline spray  3) If cough becomes bothersome can try DEXTROMETHORPHAN (Delsym) (over the counter cough suppressant).  Honey with lemon before bed.  4) Rest and lots of fluids.  5) Practice good hygiene - Wash hands with soap and water through out day.  Cover cough and sneeze with elbow.  Do not share utensils, cups, etc.    Umcka - made by [employer] - natural remedy that may shorten the duration/severity of symptoms.    Call or return if symptoms persist, worsen or you develop high fevers (>102, not treated with Tylenol or Ibuprofen).

## 2017-04-06 NOTE — Progress Notes (Signed)
Date of Service:04/06/2017    Brianna [name] is a 61 year old female who presents for sore throat.    Developed a few days ago.  Just today has some congestion otherwise no other symptoms such as cough, feeling of post nasal drip, fevers.  Did feel a little malaise the days prior to sore throat but none since.  No myalgias.  Has been doing tea, lemon.  No meds for this.  Going away this weekend and wanted to be sure nothing that needs antibiotics, etc.    No known sick contacts.    PMHx:  Patient Active Problem List:     Post-traumatic stress disorder, chronic     FATIGUE GENERAL     Family history of malignant neoplasm of breast     Screening for unspecified condition     Dissociative disorder or reaction, unspecified     Major depressive disorder, recurrent episode, moderate ([place])     Bunion     Vitamin D deficiency     High myopia, both eyes     Pseudophakia     History of osteopenia     Urge incontinence     Other microscopic hematuria     External hemorrhoids     Not immune to hepatitis B virus      Medications: Propranolol PRN  Allergies: PCN    SOCIAL: Non-smoker.    OBJECTIVE:  General:  Well appearing, interactive, cooperative, in no apparent distress  Vitals: BP 110/76  Temp 99.3 F (37.4 C) (Temporal)  Wt 50.3 kg (111 lb)  BMI 19.92 kg/m2  Nose: mucous membranes and turbinates mildly erythematous and mildly edematous  Mouth/Throat: mucous membranes moist, tonsils not enlarged with no erythema, petechiae or exudates.  Neck: No lymphadenopathy or tenderness  Heart: RRR, no murmurs, rubs or gallops  Lungs: CTA bilaterally with no wheezes, rhonchi or rales    Rapid strep negative.    ASSESSMENT/PLAN:   (J02.9) Acute pharyngitis, unspecified etiology  (primary encounter diagnosis)  Comment: Viral vs post nasal drip.  Discussed etiology with patient.  Possible to develop more URI symptoms in the next 1-2 weeks.  Plan: RAPID STREP (POINT OF CARE), THROAT CULTURE         BETA STREP        Continue supportive  measures.   Discussed OTC meds as needed - see patient instructions.   Follow-up PRN unexpected symptoms or worsening, etc.

## 2017-04-06 NOTE — Telephone Encounter (Signed)
TC to Brianna [name] to discuss her most recent msg my-chart msg to Dr. Dorna Leitz.   Brianna [name] reports consistent sx since Wednesday and nasal congestion since this morning.   Denies fever, ear pain, HA however sore throat is constant   Same day apt advised for assessment to r/o a bacterial inf vs viral.   Brianna [name] in agreement and apt scheduled with team PA, K. McAree.  Brianna [name] agrees with this plan and disposition and has no further questions or concerns at this time.     [name], RN, 04/06/2017, 11:16 AM    Dear Dr. Dorna Leitz,  I'm wondering if there is a bug going around or if the conditions are right to spark allergies. I have had a sore throat since Wed. night.While I've been doing things to soothe it and it's getting better, I just wanted to check that it was something common, rather than something that calls for concern.

## 2017-04-07 ENCOUNTER — Encounter (HOSPITAL_BASED_OUTPATIENT_CLINIC_OR_DEPARTMENT_OTHER): Payer: Self-pay | Admitting: Rehabilitative and Restorative Service Providers"

## 2017-04-08 ENCOUNTER — Ambulatory Visit (HOSPITAL_BASED_OUTPATIENT_CLINIC_OR_DEPARTMENT_OTHER): Payer: MEDICARE | Admitting: Rehabilitative and Restorative Service Providers"

## 2017-04-08 DIAGNOSIS — N3941 Urge incontinence: Principal | ICD-10-CM

## 2017-04-08 LAB — THROAT CULTURE BETA STREP

## 2017-04-10 ENCOUNTER — Ambulatory Visit (HOSPITAL_BASED_OUTPATIENT_CLINIC_OR_DEPARTMENT_OTHER): Payer: MEDICARE | Admitting: Rehabilitative and Restorative Service Providers"

## 2017-04-10 NOTE — Progress Notes (Signed)
S: Pt states that she has been better overall, but in last 2 to 3 days had some leakage and possibly more urgency. Pt states that she had some trauma activation with use of tennis balls.  O: Refer to Rehabilitation Treatment Flowsheet. Pt responded well to Manual Rx., and had more fascial tightness in post more than ant pelvis, more on L slightly. Pt shows good understanding of concept of doing HEP for preventing tightness and frequency as well as any leakage. Pt reponds well to Rx./shows good understanding and performance of exercises. Added foam roller releases for post pelvis which Pt felt good with. Pt not to do tennis ball release since too much pressure.  A: Pt responds to rx.   P: cont. PT as POC. Added 2 more visits.    Carola Rhine, PT, Lic # [id number]

## 2017-04-10 NOTE — Progress Notes (Signed)
04/08/17 1636   Language Information   Language of Care English   Rehab Discipline   Rehab Discipline PT   Visit   Visit number 6   Pain   Pain Score 0    Manual Therapy   Technique pelvic floor release   Manual Therapy 2   Technique L-S decompression and L-S area release   Manual Therapy 3   Technique TP release in post pelvis in SL   Manual Therapy 4   Technique CNS down regulation with diaphragm release and CST   Ther Exercise   Exercise education regarding pelvic floor exercises  (to do in H/L and sitting 20 to 30 reps/day, 3 to 5x/week)   Ther Exercise 2   Exercise 4-ped pelvic tilt and PT contr/release   Ther Exercise 3   Exercise 3 glut sets with PF contr and relaxation   Ther Exercise 4   Exercise 4 stretches as SKC and piriformis stretch   Ther Exercise 5   Exercise 5 hamstring stretches and SKC/DKC added for pelvic release   Patient Education   What was taught? HEP reveiwed and added onto   Method Verbal;Demo;Practice   Patient comprehension Yes     Carola Rhine, PT, Lic # [id number]

## 2017-04-17 ENCOUNTER — Ambulatory Visit (HOSPITAL_BASED_OUTPATIENT_CLINIC_OR_DEPARTMENT_OTHER): Payer: MEDICARE | Admitting: Rehabilitative and Restorative Service Providers"

## 2017-04-24 ENCOUNTER — Ambulatory Visit (HOSPITAL_BASED_OUTPATIENT_CLINIC_OR_DEPARTMENT_OTHER): Payer: MEDICARE | Admitting: Rehabilitative and Restorative Service Providers"

## 2017-04-28 ENCOUNTER — Ambulatory Visit (HOSPITAL_BASED_OUTPATIENT_CLINIC_OR_DEPARTMENT_OTHER): Payer: MEDICARE | Admitting: Rehabilitative and Restorative Service Providers"

## 2017-04-28 DIAGNOSIS — N3941 Urge incontinence: Secondary | ICD-10-CM

## 2017-04-28 NOTE — Progress Notes (Signed)
S: Pt states that she has been better in the last few days when she was visiting with her aunt, seemd like she was more relaxaed and had no symptoms of urinary frequency.  O: Refer to Rehabilitation Treatment Flowsheet. Pt responded well to Manual Rx., and had more fascial tightness in post more than ant pelvis, more on L slightly. Pt shows good understanding of concept of doing HEP for preventing tightness and frequency as well as any leakage. Pt reponds well to Rx./shows good understanding and performance of exercises. Added foam roller releases for post pelvis which Pt felt good with. Pt not to do tennis ball release since too much pressure. Pt to focus as HEP on deep breathing and gentle direction of energy technique with hands into releasing tension cranially and diaphragm and pelvic area.  A: Pt responds to rx.   P: cont. PT as POC. To cont PT in 2 to 3 weeks.    Carola Rhine, PT, Lic # [id number]

## 2017-04-28 NOTE — Progress Notes (Signed)
04/28/17 1400   Language Information   Language of Care English   Rehab Discipline   Rehab Discipline PT   Visit   Visit number 7   Time Calculation   Start Time 1335   Stop Time 1420   Time Calculation (min) 45 min   Pain   Pain Score 0    Manual Therapy   Technique pelvic floor release   Manual Therapy 2   Technique L-S decompression and L-S area release   Manual Therapy 3   Technique dural tube balancing and cranial releases   Manual Therapy 4   Technique CNS down regulation with diaphragm release and CST   Ther Exercise   Exercise education regarding pelvic floor exercises  (to do in H/L and sitting 20 to 30 reps/day, 3 to 5x/week)   Ther Exercise 2   Exercise relaxation, abdominal breathing   Patient Education   What was taught? HEP reveiwed and relaxation breathing, self care, direction of energy for diaphragm and pelvic area   Method Verbal;Demo;Practice   Patient comprehension Yes     Carola Rhine, PT, Lic # [id number]

## 2017-05-04 ENCOUNTER — Encounter (HOSPITAL_BASED_OUTPATIENT_CLINIC_OR_DEPARTMENT_OTHER): Payer: Self-pay | Admitting: Family Medicine

## 2017-05-04 ENCOUNTER — Ambulatory Visit (HOSPITAL_BASED_OUTPATIENT_CLINIC_OR_DEPARTMENT_OTHER): Payer: Self-pay

## 2017-05-04 ENCOUNTER — Ambulatory Visit (HOSPITAL_BASED_OUTPATIENT_CLINIC_OR_DEPARTMENT_OTHER): Payer: MEDICARE | Admitting: Family Medicine

## 2017-05-04 VITALS — BP 108/74 | HR 68 | Temp 98.0°F | Wt 111.0 lb

## 2017-05-04 DIAGNOSIS — R7989 Other specified abnormal findings of blood chemistry: Principal | ICD-10-CM

## 2017-05-04 DIAGNOSIS — R5381 Other malaise: Secondary | ICD-10-CM

## 2017-05-04 DIAGNOSIS — R7309 Other abnormal glucose: Secondary | ICD-10-CM

## 2017-05-04 DIAGNOSIS — R5383 Other fatigue: Secondary | ICD-10-CM

## 2017-05-04 LAB — COMPREHENSIVE METABOLIC PANEL
ALANINE AMINOTRANSFERASE: 19 U/L (ref 12–45)
ALBUMIN: 4.2 g/dL (ref 3.4–5.0)
ALKALINE PHOSPHATASE: 64 U/L (ref 45–117)
ANION GAP: 5 mmol/L (ref 5–15)
ASPARTATE AMINOTRANSFERASE: 11 U/L (ref 8–34)
BILIRUBIN TOTAL: 0.3 mg/dL (ref 0.2–1.0)
BUN (UREA NITROGEN): 19 mg/dL — ABNORMAL HIGH (ref 7–18)
CALCIUM: 8.9 mg/dL (ref 8.5–10.1)
CARBON DIOXIDE: 32 mmol/L (ref 21–32)
CHLORIDE: 104 mmol/L (ref 98–107)
CREATININE: 0.8 mg/dL (ref 0.4–1.2)
ESTIMATED GLOMERULAR FILT RATE: >60 mL/min (ref >60)
Glucose Random: 93 mg/dL (ref 74–160)
POTASSIUM: 4.2 mmol/L (ref 3.5–5.1)
SODIUM: 141 mmol/L (ref 136–145)
TOTAL PROTEIN: 7.1 g/dL (ref 6.4–8.2)

## 2017-05-04 NOTE — Progress Notes (Signed)
SUBJECTIVE  Brianna [name] is a 61 year old English-speaking female patient of Destani Wamser MD who presents for a follow up to discuss:    -- malaise  - feeling quite a bit better now    -- elevated LFTs  - unclear cause  - she eliminated kava and curcumin  - also stopped taking propranolol  - on no other meds/supplements  - has had etoh once this year; never had any significant ETOH hx  - used marijuana every now and then in high school  - no significant travel in the developing world    -- wants to review other labs:  - A1C is 5.9 - says her diet consists of daily quinoa/brown rice/raisin bowls with salad on the side      Review of Systems  Pertinent positive and negative ROS as noted above. All other systems were reviewed and negative.    Past Medical History:  No date: Anxiety  No date: Astigmatism  No date: Cataract  BMD 8/04: Disorder of bone and cartilage, unspecified      Comment:  Osteopenia only, femoral neck.  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 MAH: EGD,                Cscopy 09/09/06: all normal. 2/08: rslvd.  No date: Major depressive disorder, single episode, moderate ([place])  08/11/2002: Menopause  No date: Myopia  No date: Presbyopia  No date: Thyroid disease  No date: Wears eyeglasses    Allergies, medications, and problems reviewed in EpicCare.    OBJECTIVE  VS:  BP 108/74  Pulse 68  Temp 98 F (36.7 C) (Temporal)  Wt 50.3 kg (111 lb)  SpO2 98%  BMI 19.92 kg/m2  GEN:    Well appearing   NEURO:   Alert and oriented, CN II-XII grossly intact, normal speech and gait    ASSESSMENT AND PLAN  (R79.89) Elevated LFTs  (primary encounter diagnosis)  Comment: persistently mildly elevated LFTs, possibly d/t supplement intake which she stopped several weeks ago, or possibly d/t fatty liver (given mildly elevated A1C, her TAGs are likely a little elevated)  Plan:   - repeat COMPREHENSIVE METABOLIC PANEL  - if LFTs normal, then likely  elevated was d/t supplements and I suggested she stay off these  - if LFTs continue to be abnormal, would send for liver US to look for fatty liver    (R73.09) Elevated hemoglobin A1c  Comment: mild elevated in a normal weight patient  Plan:   - reviewed nutritional balance including the "plate" method, since by history her dietary intake is overly high in carbohydrates, even though these are largely complex carbs; reviewed need for more veggies and protein    (R53.81,  R53.83) Malaise and fatigue  Comment: improved  Plan:   - reviewed that labs have been normal; no current need for further work up                _______________    Beverly Gust MD, 05/04/2017, 5:13 PM

## 2017-05-05 ENCOUNTER — Encounter (HOSPITAL_BASED_OUTPATIENT_CLINIC_OR_DEPARTMENT_OTHER): Payer: Self-pay | Admitting: Family Medicine

## 2017-05-22 ENCOUNTER — Ambulatory Visit (HOSPITAL_BASED_OUTPATIENT_CLINIC_OR_DEPARTMENT_OTHER): Payer: MEDICARE | Admitting: Rehabilitative and Restorative Service Providers"

## 2017-05-22 DIAGNOSIS — N3941 Urge incontinence: Secondary | ICD-10-CM

## 2017-05-29 NOTE — Progress Notes (Signed)
05/22/17 1208   Language Information   Language of Care English   Interpreter No   Rehab Discipline   Rehab Discipline PT   Visit   Visit number 8   Time Calculation   Start Time 1300   Stop Time 1330   Time Calculation (min) 30 min   Pain   Pain Score 0    Manual Therapy   Technique pelvic floor release   Manual Therapy 2   Technique L-S decompression and L-S area release   Manual Therapy 3   Technique dural tube balancing and cranial releases   Manual Therapy 4   Technique CNS down regulation with diaphragm release and CST   Ther Exercise   Exercise education regarding pelvic floor exercises  (to do in H/L and sitting 20 to 30 reps/day, 3 to 5x/week)   Ther Exercise 2   Exercise relaxation, abdominal breathing   Ther Exercise 3   Exercise 3 self care: STM for diaphragm and pelvis ant   Patient Education   What was taught? releaxation/self care reviewed and added onto   Method Verbal;Demo;Practice   Patient comprehension Yes     Carola Rhine, PT, Lic # [id number]

## 2017-05-29 NOTE — Progress Notes (Signed)
S: Pt states that she hfelt more teary/emotional after last visit for 2 days, that she noticed in the past that she has strong responses to Manual rx's, but felt better in the days after that. Pt states that she still has some urinary frequency which varies in intensity, but tendency to be min better.  O: Refer to Rehabilitation Treatment Flowsheet. Pt responded well to Manual Rx., and had more fascial tightness in post more than ant pelvis, more on L slightly. Pt shows good understanding of concept of doing HEP for preventing tightness and frequency as well as any leakage. Pt reponds well to Rx./shows good understanding and performance of exercise.Pt to focus as HEP on deep breathing and gentle direction of energy technique with hands into releasing tension cranially and diaphragm and pelvic area. Pt expresses understanding of this.  A: Pt responds to rx.   P: cont. PT as POC. To cont PT in 2 to 3 weeks.    Carola Rhine, PT, Lic # [id number]

## 2017-06-02 ENCOUNTER — Ambulatory Visit (HOSPITAL_BASED_OUTPATIENT_CLINIC_OR_DEPARTMENT_OTHER): Payer: MEDICARE | Admitting: Rehabilitative and Restorative Service Providers"

## 2017-06-02 DIAGNOSIS — N3941 Urge incontinence: Secondary | ICD-10-CM

## 2017-06-02 NOTE — Progress Notes (Signed)
S: Pt states that she has been feeling better regarding bladder issues, incontinence and frequency.  O: Refer to Rehabilitation Treatment Flowsheet. Pt responded well to Manual Rx., and had more fascial tightness in post more than ant pelvis, more on L slightly. Pt shows good understanding of concept of doing HEP for preventing tightness and frequency as well as any leakage. Pt responds well to Rx./shows good understanding and performance of exercise.Pt to focus as HEP on deep breathing and gentle direction of energy technique with hands into releasing tension cranially and diaphragm and pelvic area. Pt expresses understanding of this.  A: Pt responds to rx.   P: cont. PT as POC. Recert next time.    Carola Rhine, PT, Lic # [id number]

## 2017-06-03 ENCOUNTER — Ambulatory Visit (HOSPITAL_BASED_OUTPATIENT_CLINIC_OR_DEPARTMENT_OTHER): Payer: MEDICARE | Admitting: Ophthalmology

## 2017-06-03 DIAGNOSIS — H26491 Other secondary cataract, right eye: Principal | ICD-10-CM

## 2017-06-03 NOTE — Progress Notes (Signed)
For evaluation of blurred vision OD

## 2017-06-03 NOTE — Progress Notes (Signed)
Pt here for fu.    Has been noticing something getting in the way of seeing clearly through OD.    S/P YAG OS 12/01/16    Pseudophakia OU (2015)

## 2017-06-03 NOTE — Progress Notes (Signed)
Impression,    1. Pseudophakia OU-had nicely centered [place] OU    2. Opacified posterior capsule Right Eye    Plan-Yag laser capsulotomy Right eye.

## 2017-06-08 ENCOUNTER — Encounter (HOSPITAL_BASED_OUTPATIENT_CLINIC_OR_DEPARTMENT_OTHER): Payer: Self-pay | Admitting: Family Medicine

## 2017-06-08 ENCOUNTER — Ambulatory Visit (HOSPITAL_BASED_OUTPATIENT_CLINIC_OR_DEPARTMENT_OTHER): Payer: MEDICARE | Admitting: Family Medicine

## 2017-06-08 VITALS — BP 102/76 | HR 76 | Temp 98.9°F | Wt 115.0 lb

## 2017-06-08 DIAGNOSIS — R232 Flushing: Secondary | ICD-10-CM

## 2017-06-08 LAB — THYROID SCREEN TSH REFLEX FT4: THYROID SCREEN TSH REFLEX FT4: 1.01 u[IU]/mL (ref 0.358–3.740)

## 2017-06-08 NOTE — Progress Notes (Signed)
URGENT CARE VISIT    SUBJECTIVE  Brianna [name] is a 61 year old English-speaking female patient of Gio Janoski MD who presents for an urgent visit c/o:    -- gets hot and cold very easily - very difficult for her to maintain a comfortable body temperature  - has had this for a couple of years  - used to get cold hands, feet and nose but now experiences this "all over"; no color changes of hands and feet  - no big recent weight changes  - has "run cold" most of her life but then go hot flashes ~ 16 years ago but those went away; she is now getting something similar to hot flashes - these happen a couple of times a day   - rarely has night sweats  - some weight fluctuations  - not on medications or supplements any more  - was on propranolol intermittently in the past for anxiety    - admits to having dry skin; a lot of hair loss; no constipation/diearrhea; palpitations on occasion; no excess sweating  - no color changes of her toes/fingers when very cold    Menopause was ~ age 73 and hot flashes stopped ~age 73; started again ~age 78           Review of Systems  Pertinent positive and negative ROS as noted above. All other systems were reviewed and negative.    Past Medical History:  No date: Anxiety  No date: Astigmatism  No date: Cataract  BMD 8/04: Disorder of bone and cartilage, unspecified      Comment:  Osteopenia only, femoral neck.  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 MAH: EGD,                Cscopy 09/09/06: all normal. 2/08: rslvd.  No date: Major depressive disorder, single episode, moderate ([place])  08/11/2002: Menopause  No date: Myopia  No date: Presbyopia  No date: Thyroid disease  No date: Wears eyeglasses    Allergies, medications, and problems reviewed in EpicCare.    OBJECTIVE  VS:  BP 102/76 (Site: RA, Position: Sitting, Cuff Size: Reg)  Pulse 76  Temp 98.9 F (37.2 C) (Temporal)  Wt 52.2 kg (115 lb)  LMP  (LMP Unknown)  SpO2  97%  BMI 20.63 kg/m2  GEN:    Well appearing   HEENT:   EOMI, [place]; no exophthalmos  NECK:   No LAD  CV:    RRR, nlS1S2, no MRG  CHEST:   CTAB, no WRR  NEURO:   Alert and oriented, CN II-XII grossly intact, normal speech and gait    ASSESSMENT AND PLAN  (R23.2) Hot flashes  (primary encounter diagnosis)  Comment: need to exclude non-menopausal reasons since the sx's she had around menopause resolved years ago  Plan:  - check THYROID SCREEN TSH REFLEX FT4, and if normal, will refer to endocrine       _______________    Beverly Gust MD, 06/08/2017, 4:55 PM

## 2017-06-09 ENCOUNTER — Ambulatory Visit (HOSPITAL_BASED_OUTPATIENT_CLINIC_OR_DEPARTMENT_OTHER): Payer: MEDICARE | Admitting: Ophthalmology

## 2017-06-09 DIAGNOSIS — H2640 Unspecified secondary cataract: Secondary | ICD-10-CM

## 2017-06-09 MED ORDER — PREDNISOLONE ACETATE 1 % OP SUSP: 1 [drp] | mL | Freq: Four times a day (QID) | OPHTHALMIC | 1 refills | 0 days | Status: AC

## 2017-06-09 MED ORDER — PREDNISOLONE ACETATE 1 % OP SUSP
1.00 [drp] | Freq: Four times a day (QID) | OPHTHALMIC | 1 refills | Status: AC
Start: 2017-06-09 — End: 2017-06-14

## 2017-06-09 NOTE — Progress Notes (Signed)
The patient is having a Neodymium Yag laser capsulotomy of the right eye.  The risks, benefits and alternatives of Yag laser capsulotomy have been discussed, and the patient has had his/her questions answered satisfactorily.    PATIENT/PROCEDURE VERIFICATION DOCUMENTATION    Correct patient: Yes  Correct procedure: Yes  Correct side, site, mark visible if applicable: Yes  Correct position: Yes  Special equipment/implant(s) present, if applicable: Yes    Time-out completed, documented by provider doing procedure or designated team member:  Charise Carwin, MD    06/09/2017    11:48 AM    Neodymium Yag Laser Capsulotomy Record    Power 2.2 milliwatts    Number of shots -26    Drawing created? No    The patient tolerated well? Yes.    The patient was prescribed Econopred 1%, one drop to the right eye, four times a day for three days.  Follow up one week to check the intraocular pressure.    If the ophthalmologist recorded the intraocular pressure in right eye, 20 minutes after the procedure the record IOP (mm HG):done by technician

## 2017-06-09 NOTE — Progress Notes (Signed)
For Yag laser capsulotomy OD for opacified posterior capsule

## 2017-06-09 NOTE — Progress Notes (Signed)
The patient is having a Neodymium Yag laser capsulotomy of the right eye.  The risks, benefits and alternatives of Yag laser capsulotomy have been discussed, and the patient has had his/her questions answered satisfactorily.    Preoperative eye drops were installed by (initials) LS at (time) 10:15  These are dilating drops (Phe 2.5%; mydriacyl 1%) and alphagan P 0.15%) to the right eye.    If the technician recorded the intraocular pressure in right eye, 20 minutes after the procedure the record IOP (mm HG):

## 2017-06-11 NOTE — Progress Notes (Signed)
Based upon clinical judgement and chart review, functional reporting determined to be:  Mobility, walking,     [id number] - CJ  [id number] - [place], PT, [place] # [id number]

## 2017-06-17 ENCOUNTER — Other Ambulatory Visit (HOSPITAL_BASED_OUTPATIENT_CLINIC_OR_DEPARTMENT_OTHER): Payer: Self-pay | Admitting: Family Medicine

## 2017-06-17 ENCOUNTER — Encounter (HOSPITAL_BASED_OUTPATIENT_CLINIC_OR_DEPARTMENT_OTHER): Payer: Self-pay | Admitting: Family Medicine

## 2017-06-17 DIAGNOSIS — R232 Flushing: Secondary | ICD-10-CM

## 2017-06-18 ENCOUNTER — Other Ambulatory Visit (HOSPITAL_BASED_OUTPATIENT_CLINIC_OR_DEPARTMENT_OTHER): Payer: Self-pay | Admitting: Internal Medicine

## 2017-06-18 DIAGNOSIS — N951 Menopausal and female climacteric states: Secondary | ICD-10-CM

## 2017-06-18 NOTE — e-consult (Signed)
Endocrinology e-Consultation    Thank you for referring your patient for an e-consultation. I have not interviewed or examined the patient. My recommendations here are based on the review of relevant clinical information and information provided by the referring provider.    Consultation request:   61 yo with h/o hot flashes intermittently over the past 2 years and prior to this not since menopause.  TSH is normal. Please weigh in on further workup or if she should just be treated for menopausal sx's    Menopause was ~ age 89 and hot flashes stopped ~age 41; started again ~age 72   - gets hot and cold very easily - very difficult for her to maintain a comfortable body temperature  - has had this for a couple of years  - used to get cold hands, feet and nose but now experiences this "all over"; no color changes of hands and feet  - no big recent weight changes  - has "run cold" most of her life but then go hot flashes ~ 16 years ago but those went away; she is now getting something similar to hot flashes - these happen a couple of times a day   - rarely has night sweats  - some weight fluctuations  - not on medications or supplements any more  - was on propranolol intermittently in the past for anxiety  - admits to having dry skin; a lot of hair loss; no constipation/diearrhea; palpitations on occasion; no excess sweating  - no color changes of her toes/fingers when very cold    For further correspondence/information please refer to the contact information below:    Madjzoub,Shirin MD     Case Summary:    This is a 61 year old female who went through menopause at age 56, had hot flashes until age 17, symptom subsided then restarted 2-3 years ago.  The question is whether any endocrine assessment will be needed.  Her thyroid function was normal as shown below.  She did not have any hypertension.    THYROID FLOWSHEET Latest Ref Rng & Units 06/08/2017   Thyroid Screen TSH 0.358 - 3.740 uIU/mL 1.010      Assessment:  It should be noted that hot flashes can even occur at age 75 many years after menopause.  So that remains to be 1 of the main reasons the patient has such experience.  From endocrine standpoint, the first step will be to rule out the possibilities, including thyrotoxicosis, carcinoid syndrome, or pheochromocytoma.  Pheochromocytoma would be very unlikely given the normal blood pressure but sometimes it can be reported normal since the hypertensive moment was not captured.  I will suggest the following.    Recommendations:  1.  May obtain random plasma metanephrine as well as the 24-hour urinary 5-HIAA.  2.  May consider a trial of hot flash treatment such as antidepressants or muscle relaxants and find out whether that improve her symptoms.  If that is the case, it indirectly confirm the etiology.      Please let me know whether I can be of further assistance and please let me know how the patient responds to your management.    Sincerely yours,    Alayiah Fontes H. Gabrielle Wakeland, MD    Patient Disposition : NO FOLLOW UP VISIT NEEDED    Time spent on this e-Consultation :  Less than 15 min

## 2017-06-19 ENCOUNTER — Ambulatory Visit (HOSPITAL_BASED_OUTPATIENT_CLINIC_OR_DEPARTMENT_OTHER): Payer: MEDICARE | Admitting: Rehabilitative and Restorative Service Providers"

## 2017-06-19 DIAGNOSIS — N3941 Urge incontinence: Principal | ICD-10-CM

## 2017-06-23 ENCOUNTER — Ambulatory Visit (HOSPITAL_BASED_OUTPATIENT_CLINIC_OR_DEPARTMENT_OTHER): Payer: Self-pay

## 2017-06-24 NOTE — Progress Notes (Signed)
Recertification:    S: Pt states that she has been feeling better regarding bladder issues, incontinence and frequency.  O: Refer to Rehabilitation Treatment Flowsheet. Pt responded well to Manual Rx., and had more fascial tightness in post more than ant pelvis, more on L slightly. Pt shows good understanding of concept of doing HEP for preventing tightness and frequency as well as any leakage. Pt responds well to Rx./shows good understanding and performance of exercise.Pt to focus as HEP on deep breathing and gentle direction of energy technique with hands into releasing tension cranially and diaphragm and pelvic area. Pt expresses understanding of this.  A: Pt is a 61 year old y/o female who presents today after completing 10 visits of physical therapy for urinary frequency and incontinence. Pt has made intermittently improvements thus far objectively in TVA and pelvic floor activation, performance of relaxation interventions with stretching and relaxation breathing which has allowed for improved symptoms/less frequency. However, Pt  does continue to have deficits in pelvic floor tightness/fascial restrictions and nervous system hyperactivity due to PTSD/mental Health issues.. These deficits have continued to impact her ability to consistently control frequency issues. Pt continues to be motivated to improve and is at times compliant with basic HEP and self care as relaxation methods. She would benefit from continued skilled physical therapy to address her current impairments and allow for return to prior level of function.     P: cont. PT as POC. To continue to work on [employer] system Hyperactivity with restorative Yoga, Craniosacral therapy and MFR for NS down regulation and Cranial and overall global Myofascial restrictions which impede on making progress in her symptoms,   and for more relaxation therex for self care/HEP.  Goals in PT: Pt has met most STG's except for being able to do  relaxation of PF with pelvic floor therex smoothly without tensing up  New STG's: Pt to be independent with HEP/self care  Pt to have improved ability to down regulate CNS  Pt to have at least 30 to 40% decreased MF restricitons     Mekala Winger, PT, Lic # [id number]

## 2017-06-24 NOTE — Progress Notes (Signed)
06/19/17 2122   [facility]   Visit   Visit number 10   Time Calculation   Start Time 1400   Stop Time 1430   Time Calculation (min) 30 min   Pain   Pain Score 0    Manual Therapy   Technique pelvic floor release   Manual Therapy 2   Technique L-S decompression and L-S area release   Manual Therapy 3   Technique dural tube balancing and cranial releases   Manual Therapy 4   Technique CNS down regulation with diaphragm release and CST   Ther Exercise   Exercise education regarding self care acts such as holding head, pelvis and diaphragm release with own hands direction of energy   Ther Exercise 2   Exercise relaxation, abdominal breathing   Ther Exercise 3   Exercise 3 self care: STM for diaphragm and pelvis ant   Ther Exercise 4   Exercise 4 cranial hold for self care   Patient Education   What was taught? releaxation/self care reviewed and added onto   Method Verbal;Demo;Practice   Patient comprehension Yes     Carola Rhine, PT, Lic # [id number]

## 2017-06-26 NOTE — Progress Notes (Signed)
I certify that the documented Treatment Plan is reasonable and necessary.    06/26/2017  Joanne Chars MD

## 2017-06-29 ENCOUNTER — Ambulatory Visit (HOSPITAL_BASED_OUTPATIENT_CLINIC_OR_DEPARTMENT_OTHER): Payer: MEDICARE | Admitting: Family Medicine

## 2017-07-06 NOTE — Dental Note (Signed)
Medical Alert: Allergies    Mental Disorders    Other    Vitamin Deficiency  Medications: Other  Allergies:      Penicillin  Since Last Visit: Medical Alert: No Change    Medications: No Change    Allergies:        No Change  Pain Scale Type: Numeric Pain Scale Pain Level: 0  Description:    Patient presented for a hygiene visit.    Interpreter: None    Patient pain level at beginning of appointment: 0    *Medical/Brianna [name] History*  Medical history: No change    *Radiographs*  Radiographs taken: 2 PAs  Radiographs taken to evaluate for: Eval for caries and/or pathology    *Hygiene Observations*  - Overall Oral Hygiene:Fair  - Plaque: Medium  - Staining: Light  - Calculus: Medium  - Food Impaction: Light  - Bleeding: Light  - Gingival Attachment: Recession  - Periodontal description: Generalized fibrotic tissue  - Periodontal probing: N/A  - Perio classification: II-Chronic Periodontitis, generalized, moderate  severity  -Home Care: Fair  -Intraoral exam: generalized recession    *Treatment*  Services provided: Prophy and Oral hygiene Instruction.  Prophylaxis today consisted of hand scale, prophy cup polish, Cavitron and  flossing. Proper technique of brushing demonstrated.  Flossing with fingers   and   a floss holder demonstrated.  Patient given a soft toothbrush and floss.    *Assessment & Plan*  Patient was not examined by dentist.  Assessment/Plan: NA    Patient pain level at end of appointment: 0    Patient left ambulatory, satisfied with treatment. Any remaining planned  treatments were explained in detail to the patient.    Hygiene Recare 6 month interval    Next Visit: Brianna [name] Exam to check for Brianna [name] decay  *Instructions for Receptionist*  Procedure:  Provider:  When:    ----- Signed on Monday, May 04, 2017 at 11:24:52 AM  -----  ----- Provider: Jamie Brookes - Quillian Quince, RDH -- Clinic: [facility] -----

## 2017-07-06 NOTE — Dental Note (Signed)
Patient presented for a periodic exam.    Patient pain level at beginning of appointment:  3    Interpreter: None    **Medical/Brianna [name] History**  Chief complaint:  History of present illness:  Pertinent medical alerts: None  Medical history: No change    **Periodic Exam**  Extraoral exam: WNL (normal and symmetrical)  Intraoral exam: WNL (good hygiene, no masses, ulcerations, or discoloration)  Occlusion classification: Class I  Periodontal probing: N/A  Periodontal exam: Generalized fibrotic tissue, whitish-pink (blanched),   blunted   embrasure, nodular  Radiographs taken:  Radiographs taken to evaluate for:  Radiographic exam:    **Assessment**  Prescription given: Peridex  Referral:  Assessment plan: Spot probing=43mm  Cracked fillings  #3 - fistila on buccal furcation    Patient pain level at end of appointment:    Patient left ambulatory, satisfied with treatment. Any remaining planned  treatments were explained in detail to the patient.    Assistant: Cora Daniels    Next visit: What: fillins redo and observation #3  Reason:    ----- Signed on Tuesday, June 23, 2017 at 3:14:43 PM  -----  ----- Provider: [place] [facility]: [facility] -----  ----- Override Provider: Roney Jaffe, DDS -----

## 2017-07-07 ENCOUNTER — Ambulatory Visit (HOSPITAL_BASED_OUTPATIENT_CLINIC_OR_DEPARTMENT_OTHER): Payer: MEDICARE | Admitting: Family Medicine

## 2017-07-07 ENCOUNTER — Encounter (HOSPITAL_BASED_OUTPATIENT_CLINIC_OR_DEPARTMENT_OTHER): Payer: Self-pay | Admitting: Family Medicine

## 2017-07-07 VITALS — BP 110/78 | HR 65 | Temp 99.0°F | Wt 113.0 lb

## 2017-07-07 DIAGNOSIS — R232 Flushing: Secondary | ICD-10-CM

## 2017-07-07 DIAGNOSIS — Z23 Encounter for immunization: Secondary | ICD-10-CM

## 2017-07-07 DIAGNOSIS — F4312 Post-traumatic stress disorder, chronic: Secondary | ICD-10-CM

## 2017-07-07 DIAGNOSIS — Z1239 Encounter for other screening for malignant neoplasm of breast: Secondary | ICD-10-CM

## 2017-07-07 DIAGNOSIS — F449 Dissociative and conversion disorder, unspecified: Secondary | ICD-10-CM

## 2017-07-07 DIAGNOSIS — L659 Nonscarring hair loss, unspecified: Principal | ICD-10-CM

## 2017-07-07 DIAGNOSIS — F331 Major depressive disorder, recurrent, moderate: Secondary | ICD-10-CM

## 2017-07-07 DIAGNOSIS — Z1231 Encounter for screening mammogram for malignant neoplasm of breast: Secondary | ICD-10-CM

## 2017-07-07 LAB — CBC, PLATELET & DIFFERENTIAL
ABSOLUTE BASO COUNT: 0 10*3/uL (ref 0.0–0.1)
ABSOLUTE EOSINOPHIL COUNT: 0.1 10*3/uL (ref 0.0–0.8)
ABSOLUTE IMM GRAN COUNT: 0.01 10*3/uL (ref 0.00–0.03)
ABSOLUTE LYMPH COUNT: 1.8 10*3/uL (ref 0.6–5.9)
ABSOLUTE MONO COUNT: 0.4 10*3/uL (ref 0.2–1.4)
ABSOLUTE NEUTROPHIL COUNT: 2.3 10*3/uL (ref 1.6–8.3)
BASOPHIL %: 0.7 % (ref 0.0–1.2)
EOSINOPHIL %: 1.7 % (ref 0.0–7.0)
HEMATOCRIT: 37 % (ref 34.1–44.9)
HEMOGLOBIN: 12.4 g/dL (ref 11.2–15.7)
IMMATURE GRANULOCYTE %: 0.2 % (ref 0.0–0.4)
LYMPHOCYTE %: 39.3 % (ref 15.0–54.0)
MEAN CORP HGB CONC: 33.5 g/dL (ref 31.0–37.0)
MEAN CORPUSCULAR HGB: 32.2 pg (ref 26.0–34.0)
MEAN CORPUSCULAR VOL: 96.1 fL (ref 80.0–100.0)
MEAN PLATELET VOLUME: 10.9 fL (ref 8.7–12.5)
MONOCYTE %: 8.7 % (ref 4.0–13.0)
NEUTROPHIL %: 49.4 % (ref 40.0–75.0)
PLATELET COUNT: 231 10*3/uL (ref 150–400)
RBC DISTRIBUTION WIDTH STD DEV: 44.9 fL (ref 35.1–46.3)
RBC DISTRIBUTION WIDTH: 12.9 % (ref 11.5–14.3)
RED BLOOD CELL COUNT: 3.85 M/uL — ABNORMAL LOW (ref 3.90–5.20)
WHITE BLOOD CELL COUNT: 4.6 10*3/uL (ref 4.0–11.0)
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
## 2017-07-10 ENCOUNTER — Ambulatory Visit (HOSPITAL_BASED_OUTPATIENT_CLINIC_OR_DEPARTMENT_OTHER): Payer: Self-pay

## 2017-07-10 MED ORDER — CHLORHEXIDINE GLUCONATE 0.12 % MT SOLN
15.00 mL | Freq: Every day | OROMUCOSAL | 0 refills | Status: AC
Start: 2017-07-10 — End: 2017-08-09

## 2017-07-10 MED ORDER — CHLORHEXIDINE GLUCONATE 0.12 % MT SOLN: 15 mL | mL | Freq: Every day | OROMUCOSAL | 0 refills | 0 days | Status: AC

## 2017-07-10 NOTE — Dental Note (Signed)
EMERGENCY EXAM + 1 PA + 1 BW + PALLIATIVE TREATMENT COMPLETED    Medical Alert: Allergies    Mental Disorders    Other    Vitamin Deficiency    Depression  Medications: Other    Propranolol  Allergies:      Penicillin  Since Last Visit: Medical Alert: No Change    Medications: No Change    Allergies:        No Change  Pain Scale Type: Numeric Pain Scale Pain Level: 0  Description:    Patient presented for an emergency exam.    Patient pain level at beginning of appointment: 0    Interpreter: None    *Medical/Ace Bergfeld History*  Chief complaint: "I broke my filling"  History of present illness: Pt reports filling in the LL broke, no pain or  temperature sensitivity reported. Pt reports gum irritation here.  Medical history reviewed and updated.    *Emergency Exam*  Pertinent findings: #19 presents with fractured filling/displaced piece in  the distal aspect. There was noted a lot of food impaction here. The existing  amalgam is large and has other fracture lines/wear.  Radiographs taken: 1 BWX and 1 PA  Radiographs taken to evaluate for: Eval for caries and/or pathology  Radiographic exam: Caries and Other    *Assessment*  Prescription given: Chlorhexidene  Referral: Not given  Assessment/Plan: Discussed with patient that the recommended treatment for   this   tooth due to the large existing filling + signs of wear and fracture would  be a build up + crown. Explained that filling materials are weaker than   natural   tooth, and when there's a large filling that increases the chances of  fracture. Additionally, explained to pt that if she opts for another filling   to   replace this fractured one, she is still at risk for fractures in the  future, and the way teeth fracture can be unpredictable and if fractured in  certain ways can make the tooth non-restorable. Pt expressed understanding   what   was discussed and will think about her treatment options.    Pt inquired about #3 at the close of the appt - pt was  previously recommended  to use chlorhexidene rinse to resolve a sinus tract on the buccal of #3. It  appears to be healing but has not fully resolved. Pt requested more  chlorhexidene, E-Rx was generated. Advised pt that radiographs will be needed  to evaluate the tooth further, and advised these to be taken at the next   visit.      *Palliative treatment*  Cleaned out the food that was impacted in the missing filling and  interproximally btwn #18/19. Placed tofflemeire matrix and restored with Fuji  IX. Pt reports bite feels normal. POIG including pulling floss through  instead of up.    Patient pain level at end of appointment: 0    Patient left ambulatory, satisfied with treatment. Any remaining planned  treatments were explained in detail to the patient.    Assistant: none    Next visit: #19 fill or crown + #3 re-evaluation/radiographs w Dr. Annette Stable    ----- Signed on Friday, July 10, 2017 at 12:16:34 PM  -----  ----- Provider: Jule Ser Madolyn Frieze,  -- Clinic: [facility] -----

## 2017-07-13 ENCOUNTER — Other Ambulatory Visit (HOSPITAL_BASED_OUTPATIENT_CLINIC_OR_DEPARTMENT_OTHER): Payer: Self-pay | Admitting: Family Medicine

## 2017-07-13 DIAGNOSIS — R232 Flushing: Secondary | ICD-10-CM | POA: Insufficient documentation

## 2017-07-13 LAB — ADD ON CAN'T BE DONE CHEMISTRY

## 2017-07-13 LAB — IRON: IRON: 59 ug/dL (ref 50–170)

## 2017-07-13 LAB — VITAMIN D,25 HYDROXY: VITAMIN D,25 HYDROXY: 26 ng/mL — ABNORMAL LOW (ref 30.0–100.0)

## 2017-07-14 ENCOUNTER — Other Ambulatory Visit (HOSPITAL_BASED_OUTPATIENT_CLINIC_OR_DEPARTMENT_OTHER): Payer: Self-pay | Admitting: Social Worker

## 2017-07-14 ENCOUNTER — Ambulatory Visit (HOSPITAL_BASED_OUTPATIENT_CLINIC_OR_DEPARTMENT_OTHER): Payer: MEDICARE | Admitting: Rehabilitative and Restorative Service Providers"

## 2017-07-14 DIAGNOSIS — N3941 Urge incontinence: Secondary | ICD-10-CM

## 2017-07-14 NOTE — Progress Notes (Signed)
Thank you for your referral. We were able to contact your patient. She was given an appointment for 07/24/17 with Carroll Sage at [address], [place].
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
## 2017-07-17 NOTE — Progress Notes (Signed)
07/14/17 1651   Language Information   Language of Care English   Rehab Discipline   Rehab Discipline PT   Visit   Visit number 11   Pain   Pain Score 0    Manual Therapy   Technique pelvic floor release   Manual Therapy 2   Technique L-S decompression and L-S area release   Manual Therapy 3   Technique dural tube balancing and cranial releases   Manual Therapy 4   Technique CNS down regulation with diaphragm release and CST   Ther Exercise   Exercise education regarding self care acts such as holding head, pelvis and diaphragm release with own hands direction of energy   Ther Exercise 2   Exercise relaxation, abdominal breathing   Ther Exercise 3   Exercise 3 self care: STM for diaphragm and pelvis ant   Ther Exercise 4   Exercise 4 cranial hold for self care, use of Still point inducer at home   Ther Exercise 5   Exercise 5 education about practice of relaxation response, guided meditation   Patient Education   What was taught? releaxation/self care reviewed and added onto   Method Verbal;Demo;Practice   Patient comprehension Yes     Carola Rhine, PT, Lic # [id number]

## 2017-07-19 ENCOUNTER — Encounter (HOSPITAL_BASED_OUTPATIENT_CLINIC_OR_DEPARTMENT_OTHER): Payer: Self-pay | Admitting: Family Medicine

## 2017-07-20 ENCOUNTER — Ambulatory Visit (HOSPITAL_BASED_OUTPATIENT_CLINIC_OR_DEPARTMENT_OTHER): Payer: Self-pay | Admitting: Family Medicine

## 2017-07-20 DIAGNOSIS — Z1239 Encounter for other screening for malignant neoplasm of breast: Secondary | ICD-10-CM

## 2017-07-20 LAB — MA SCREENING MAMMO BILATERAL DIGITAL WITH DBT & CAD

## 2017-07-20 NOTE — Addendum Note (Signed)
Addended by: Rowan Blase on: 07/20/2017 11:05 AM     Modules accepted: Orders

## 2017-07-24 ENCOUNTER — Ambulatory Visit (HOSPITAL_BASED_OUTPATIENT_CLINIC_OR_DEPARTMENT_OTHER): Payer: MEDICARE | Admitting: Psychologist

## 2017-07-24 DIAGNOSIS — F449 Dissociative and conversion disorder, unspecified: Principal | ICD-10-CM

## 2017-07-24 DIAGNOSIS — F4310 Post-traumatic stress disorder, unspecified: Secondary | ICD-10-CM
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
## 2017-08-10 ENCOUNTER — Encounter (HOSPITAL_BASED_OUTPATIENT_CLINIC_OR_DEPARTMENT_OTHER): Payer: Self-pay | Admitting: Family Medicine

## 2017-08-12 ENCOUNTER — Encounter (HOSPITAL_BASED_OUTPATIENT_CLINIC_OR_DEPARTMENT_OTHER): Payer: Self-pay | Admitting: Family Medicine

## 2017-08-12 ENCOUNTER — Ambulatory Visit (HOSPITAL_BASED_OUTPATIENT_CLINIC_OR_DEPARTMENT_OTHER): Payer: MEDICARE | Admitting: Family Medicine

## 2017-08-12 ENCOUNTER — Other Ambulatory Visit (HOSPITAL_BASED_OUTPATIENT_CLINIC_OR_DEPARTMENT_OTHER): Payer: Self-pay | Admitting: Social Worker

## 2017-08-12 VITALS — BP 100/70 | HR 73 | Wt 114.0 lb

## 2017-08-12 DIAGNOSIS — F5102 Adjustment insomnia: Principal | ICD-10-CM

## 2017-08-12 DIAGNOSIS — F4312 Post-traumatic stress disorder, chronic: Secondary | ICD-10-CM

## 2017-08-12 DIAGNOSIS — F449 Dissociative and conversion disorder, unspecified: Secondary | ICD-10-CM

## 2017-08-12 DIAGNOSIS — J3489 Other specified disorders of nose and nasal sinuses: Secondary | ICD-10-CM

## 2017-08-12 DIAGNOSIS — F331 Major depressive disorder, recurrent, moderate: Secondary | ICD-10-CM

## 2017-08-12 MED ORDER — FLUTICASONE PROPIONATE 50 MCG/ACT NA SUSP: 1 | Bottle | Freq: Every day | NASAL | 12 refills | 0 days | Status: DC

## 2017-08-12 MED ORDER — MELATONIN 3 MG PO TABS
3.00 mg | ORAL_TABLET | Freq: Every evening | ORAL | 1 refills | Status: AC
Start: 2017-08-12 — End: 2017-10-11

## 2017-08-12 MED ORDER — MELATONIN 3 MG PO TABS: 3 mg | tablet | Freq: Every evening | ORAL | 1 refills | 0 days | Status: AC

## 2017-08-12 MED ORDER — FLUTICASONE PROPIONATE 50 MCG/ACT NA SUSP
1.0000 | Freq: Every day | NASAL | 12 refills | Status: DC
Start: 2017-08-12 — End: 2018-05-11

## 2017-08-12 NOTE — Progress Notes (Signed)
08/12/17- Thank you for referring your patient to the Outpatient Psychiatry Department. We called your patient today, but were unable to reach them directly. A message was left for the patient, asking them to call us back at [phone].     (VOV Staff Therapy)

## 2017-08-12 NOTE — Progress Notes (Signed)
Marland KitchenSUBJECTIVE: Brianna [name] is a 62 year old female Patient Active Problem List    Hot flashes         Date Noted: 07/13/2017            Started years after menopause - 11/18 endocrine e            consult:            Assessment:            It should be noted that hot flashes can even occur            at age 47 many years after menopause.  So that            remains to be 1 of the main reasons the patient            has such experience.  From endocrine standpoint,            the first step will be to rule out the            possibilities, including thyrotoxicosis, carcinoid            syndrome, or pheochromocytoma.  Pheochromocytoma            would be very unlikely given the normal blood            pressure but sometimes it can be reported normal            since the hypertensive moment was not captured.  I            will suggest the following.                        Recommendations:            1.  May obtain random plasma metanephrine as well            as the 24-hour urinary 5-HIAA.2.  May consider a            trial of hot flash treatment such as            antidepressants or muscle relaxants and find out            whether that improve her symptoms.  If that is the            case, it indirectly confirm the etiology.      Elevated hemoglobin A1c         Date Noted: 05/04/2017            02/2017 A1C = 5.9; patient counseled; repeat A1C in            the next ~ 33mths      Not immune to hepatitis B virus         Date Noted: 02/25/2017            02/2017 declines HBV for now      External hemorrhoids         Date Noted: 12/07/2016      Urge incontinence         Date Noted: 11/13/2016      Other microscopic hematuria         Date Noted: 11/13/2016            Moderate occult blood w/ few RBCs seen 2015,            moderate occult blood again  2018      History of osteopenia         Date Noted: 05/30/2014            03/22/03 BMD at [facility]: osteopenia femoral neck;            normal at lumbar spine.       Pseudophakia         Date Noted: 11/30/2013            11/30/13 OD Trulign Toric  BL1UT +11.0 cylinder            +1.25            01/22/14 OS [place] AT-52AO +9.00       High myopia, both eyes         Date Noted: 01/26/2013      Vitamin D deficiency         Date Noted: 11/30/2007      Bunion         Date Noted: 07/30/2007            R bunion.  Painful.      Major depressive disorder, recurrent episode, moderate ([place])         Date Noted: 11/05/2006      Dissociative disorder or reaction, unspecified         Date Noted: 08/24/2006      Screening for unspecified condition         Date Noted: 01/20/2006            Pap 12/05/10: atrophic, insuffic squam's. Next pap            pre-treat w topical ERT.  Pap: nl @ [facility] 4/05.            5/07: unsatisfact. (atrophic, too few squam's).            5/08: ascus, hpv-hr neg. 05/27/2013 +able to do            pap w 2 wks pre-tx w estr cream (next time, d/c            cream 2 wks ahead as residual o/w).      Family history of malignant neoplasm of breast         Date Noted: 03/14/2004            Mother dx'd at 29.      Post-traumatic stress disorder, chronic         Date Noted: 01/23/2004            + fam hx MDD both parents.  Dissociative disorder            NOS, depression, anxiety.  Tx: psychotherapy.      FATIGUE GENERAL         Date Noted: 01/23/2004       here for ongoing follow up.      Feels like has many stress related problems.  Feels like she needs more help with these issues.    Trouble going to sleep, then wakes up at 4 am and can't go back to sleep.  Has tried meditation, self care but not really helpful.  Goes to bed between 1030 and 12, feels tired but can't go to sleep.  Does not feel liek shes is perseverating, but feels "physiologic state of anxiety".  Not sure why she wakes at 4, and no more sleep for the night.  No daytimes sleeping.  Has not been taking propranolol previously rx'ed  Already stopped screen time by 8, , no TV in bed.  Problem Began in the fall  when traveled and thinks that set it off.  Not open to medications currently,    Remembers that in past mental health engagements "I would get mad at the therapist and the therapist would get mad back at me - what we needed to do and what I think I could do now is take a step back and try to understand why"     would like help navigating the mental health system as feels that there are few options open to her that would suit her needs.    Can't taste food well, nose is runny much of the time - was one of her few pleasures (enjoying food) wonders what can be done.    Still notes hair loss every day, bothersome to her.  .    Social History    Tobacco Use      Smoking status: Never Smoker      Smokeless tobacco: Never Used    Alcohol use: No      Alcohol/week: 0.2 oz    Drug use: No   Lives with roommate, noncontentious.    OBJECTIVE: Well appearing though tearful intermittently throughout visit.  BP 100/70 (Site: LA, Position: Sitting, Cuff Size: Reg)  Pulse 73  Wt 51.7 kg (114 lb)  LMP  (LMP Unknown)  SpO2 97%  BMI 20.45 kg/m2 S1 and S2 normal, no murmurs, clicks, gallops or rubs. Regular rate and rhythm. Chest is clear; no wheezes or rales. No edema or JVD.     Nares with erythematous mucosa, minimal swelling, no purulent drainage.  OP moist with mild posterior erythema and clear drainage.    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  LIkely circumstantial and related to her chronic anxiety.  Not open to any rx medications but melatonin sounded like a reasonable step.  Plan: melatonin 3 MG TABS tablet, REFERRAL TO         ACUPUNCTURIST ( INT), REFERRAL TO ADULT         PSYCHIATRY (INT)        The use, side effects  and expected effects of this medication are reviewed with the patient.     (J34.89) Rhinorrhea  Comment: unsure if related to cold or allergies - in any case bothering her and may be interfering with her sense of smell.   No evidence of marked mucosal edema. She would like to see acupuncturist for her array of sx.  Plan: fluticasone (FLONASE) 50 MCG/ACT nasal spray,         REFERRAL TO ACUPUNCTURIST ( INT)            (F44.9) Dissociative disorder or reaction, unspecified  Comment: still trying to find the right fit for treatment for her - she is open to re-referral, will ask geri psych for their input.  Plan: REFERRAL TO ACUPUNCTURIST ( INT), REFERRAL TO         ADULT PSYCHIATRY (INT)            (F33.1) Major depressive disorder, recurrent episode, moderate (HCC)  Comment: continues to have sx, does not want medication as noted above - very socially isolated, would benefit from more engagement, sounds like groups are not a comfort zone for her.  Plan: REFERRAL TO ACUPUNCTURIST ( INT), REFERRAL TO         ADULT PSYCHIATRY (  INT)        Await psych input    (F43.12) Post-traumatic stress disorder, chronic  Comment: as above, ongoing problem - she is hesitant to engage in treatment that is not "exactly what I am looking for" - reviewed that resources are limited, perhaps better to start with small steps.  She's willing to engage at this point, feels that she has better insights to engage as well.  Plan: REFERRAL TO ACUPUNCTURIST ( INT), REFERRAL TO         ADULT PSYCHIATRY (INT)          2 mo follow up sooner prn.

## 2017-08-12 NOTE — Addendum Note (Signed)
Addended by: Carroll Sage on: 08/12/2017 12:04 PM     Modules accepted: Orders

## 2017-08-13 ENCOUNTER — Other Ambulatory Visit (HOSPITAL_BASED_OUTPATIENT_CLINIC_OR_DEPARTMENT_OTHER): Payer: Self-pay | Admitting: Social Worker

## 2017-08-13 ENCOUNTER — Ambulatory Visit (HOSPITAL_BASED_OUTPATIENT_CLINIC_OR_DEPARTMENT_OTHER): Payer: Self-pay

## 2017-08-13 ENCOUNTER — Encounter (HOSPITAL_BASED_OUTPATIENT_CLINIC_OR_DEPARTMENT_OTHER): Payer: Self-pay | Admitting: Dentist

## 2017-08-13 NOTE — Dental Note (Signed)
Medical Alert: Allergies    Mental Disorders    Other    Vitamin Deficiency    Depression  Medications: Other    Propranolol  Allergies:      Penicillin  Since Last Visit: Medical Alert: No Change    Medications: No Change    Allergies:        No Change  Pain Scale Type: Numeric Pain Scale Pain Level: 0  Description:    Patient presented for an emergency exam for tooth #3    Patient pain level at beginning of appointment: 0    Interpreter: None    *Medical/Randye Treichler History*  Chief complaint: "I have a bump on my gums that I came in for before, and  I've been using a mouth rinse, that I think has made the bump better, but the  gums around the tooth are more sensitive now."  History of present illness: Pt. was seen in the clinic in November 2018, and  a perio abscess was suspected by the previous provider, buccal to tooth #3  Medical history reviewed and updated.    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    #3 vitality testing:  Cold: NR  Mobility: normal  PD's [id number] - 6 mm pocket on the ML  Non tender to palpation  slighly tender to percussion.    0.5 carpules of 3% mepivacaine without epinephrine delivered via buccal  infiltration in order to perform radiographic gutta percha tracing without  pain.    Radiographs taken: 1 PA  Radiographs taken to evaluate for: Eval for caries and/or pathology  Radiographic exam: Gutta percha tracing directly to the MB root apex    *Assessment*  Prescription given: None  Referral: Given to the patient for RCT  Assessment/Plan: Tooth #3: Necrotic tooth with chronic apical abscess -  possible cracked tooth indicated by deep probing on the ML    explained the different treatment options to the patient which include 1) RCT  buildup and crown, 2) extraction, 3) no  treatment.    Pt. asked for referral to endodontist. Explained to patient that the tooth  may have a crack that would render the tooth non restorable, and that this  would have to be evaluated during RCT, and that the tooth would need ext at  that point. Pt. uderstood.    Patient pain level at end of appointment: 0    Patient left ambulatory, satisfied with treatment. Any remaining planned  treatments were explained in detail to the patient.    Assistant: Daine Gip    Next visit: What: buildup and crown after endo, or ext of #3    ----- Signed on Thursday, August 13, 2017 at 10:15:45 AM  -----  ----- Provider: Nunzio Cory - Beatris Si,  -- Clinic: [facility] -----

## 2017-08-13 NOTE — Progress Notes (Signed)
Thank you for your referral. We were able to contact your patient.  She was given an appointment for 08/24/17 with Dorothy Puffer.

## 2017-08-18 ENCOUNTER — Ambulatory Visit (HOSPITAL_BASED_OUTPATIENT_CLINIC_OR_DEPARTMENT_OTHER): Payer: MEDICARE | Admitting: Rehabilitative and Restorative Service Providers"

## 2017-08-18 DIAGNOSIS — N3941 Urge incontinence: Secondary | ICD-10-CM

## 2017-08-18 DIAGNOSIS — F4310 Post-traumatic stress disorder, unspecified: Secondary | ICD-10-CM

## 2017-08-19 NOTE — Progress Notes (Signed)
08/18/17 1952   Language Information   Language of Care English   Rehab Discipline   Rehab Discipline PT   Visit   Visit number 12   POC Due date Recert   Time Calculation   Start Time 1400   Stop Time 1430   Time Calculation (min) 30 min   Pain   Pain Score 0    Manual Therapy   Technique pelvic floor release   Manual Therapy 2   Technique L-S decompression and L-S area release   Manual Therapy 3   Technique dural tube balancing and cranial releases   Manual Therapy 4   Technique CNS down regulation with diaphragm release and CST   Manual Therapy 5   Technique Cranial hold   Ther Exercise   Exercise education regarding self care acts such as holding head, pelvis and diaphragm release with own hands direction of energy   Ther Exercise 2   Exercise relaxation, abdominal breathing   Ther Exercise 3   Exercise 3 self care: STM for diaphragm and pelvis ant   Ther Exercise 4   Exercise 4 cranial hold for self care, use of Still point inducer at home   Ther Exercise 5   Exercise 5 education about practice of relaxation response, guided meditation   Patient Education   What was taught? releaxation/self care reviewed and added onto   Method Verbal;Demo;Practice   Patient comprehension Yes     Carola Rhine, PT, Lic # [id number]

## 2017-08-19 NOTE — Progress Notes (Signed)
Recertification on 0/0/37    S: Pt states that she has no report about bladder issues, but states that she has had at times more PTSD symptoms, and at times min better.  O: Refer to Rehabilitation Treatment Flowsheet. Pt responded well to Manual Rx., and had more fascial tightness in post more than ant pelvis, more on L slightly. Pt shows good understanding of concept of doing HEP for preventing tightness and frequency as well as any leakage. Pt responds well to Rx./shows good understanding and performance of exercise.Pt to focus as HEP on deep breathing and gentle direction of energy technique with hands into releasing tension cranially and diaphragm and pelvic area. Pt expresses understanding of this.  Pt has only been seen once since last Re certification and has not made any significant changes.  A: Pt is a 62 year old y/o female who presents today after completing 10 visits of physical therapy for urinary frequency and incontinence. Pt has made intermittently improvements thus far objectively in TVA and pelvic floor activation, performance of relaxation interventions with stretching and relaxation breathing which has allowed for improved symptoms/less frequency. However, Pt  does continue to have deficits in pelvic floor tightness/fascial restrictions and nervous system hyperactivity due to PTSD/mental Health issues.. These deficits have continued to impact her ability to consistently control frequency issues. Pt continues to be motivated to improve and is at times compliant with basic HEP and self care as relaxation methods. She would benefit from continued skilled physical therapy to address her current impairments and allow for return to prior level of function.     P: cont. PT as POC, however focusing on self care to slowly prepare Pt for discharge form PT, To continue to work on [employer] system Hyperactivity with restorative Yoga, Craniosacral therapy and MFR for NS down regulation and  Cranial and overall global Myofascial restrictions which impede on making progress in her symptoms,   and for more relaxation therex for self care/HEP.    Goals in PT: Pt has met most STG's except for being able to do relaxation of PF with pelvic floor therex smoothly without tensing up  New STG's: Pt to be independent with HEP/self care as restorative Yoga, still point induction/relaxation, mindfulness interventions  Pt to have improved ability to down regulate CNS, and this way to be able to have lessened bladder hyperactivity  Pt to have at least 30 to 40% decreased MF restricitons     Drina Jobst, PT, Lic # [id number]

## 2017-08-19 NOTE — Progress Notes (Signed)
I certify that the documented Treatment Plan is reasonable and necessary.    08/19/2017  Joanne Chars MD

## 2017-08-24 ENCOUNTER — Ambulatory Visit (HOSPITAL_BASED_OUTPATIENT_CLINIC_OR_DEPARTMENT_OTHER): Payer: MEDICARE | Admitting: Emergency Medicine

## 2017-08-24 DIAGNOSIS — F449 Dissociative and conversion disorder, unspecified: Secondary | ICD-10-CM

## 2017-08-24 DIAGNOSIS — F4310 Post-traumatic stress disorder, unspecified: Secondary | ICD-10-CM

## 2017-08-25 ENCOUNTER — Ambulatory Visit (HOSPITAL_BASED_OUTPATIENT_CLINIC_OR_DEPARTMENT_OTHER): Payer: MEDICARE | Admitting: Rehabilitative and Restorative Service Providers"

## 2017-08-25 ENCOUNTER — Encounter (HOSPITAL_BASED_OUTPATIENT_CLINIC_OR_DEPARTMENT_OTHER): Payer: Self-pay | Admitting: Family Medicine

## 2017-08-25 ENCOUNTER — Ambulatory Visit (HOSPITAL_BASED_OUTPATIENT_CLINIC_OR_DEPARTMENT_OTHER): Payer: MEDICARE | Admitting: Ophthalmology

## 2017-08-25 DIAGNOSIS — F4310 Post-traumatic stress disorder, unspecified: Principal | ICD-10-CM

## 2017-08-25 DIAGNOSIS — N3941 Urge incontinence: Secondary | ICD-10-CM

## 2017-08-26 ENCOUNTER — Encounter (HOSPITAL_BASED_OUTPATIENT_CLINIC_OR_DEPARTMENT_OTHER): Payer: Self-pay | Admitting: Family Medicine

## 2017-08-26 MED ORDER — PROPRANOLOL HCL 10 MG PO TABS
10.00 mg | ORAL_TABLET | Freq: Three times a day (TID) | ORAL | 3 refills | Status: AC | PRN
Start: 2017-08-26 — End: 2018-12-26

## 2017-08-26 MED ORDER — PROPRANOLOL HCL 10 MG PO TABS: 10 mg | tablet | Freq: Three times a day (TID) | ORAL | 3 refills | 0 days | Status: AC | PRN

## 2017-08-26 NOTE — Progress Notes (Signed)
OUTPATIENT PSYCHIATRY PROGRESS NOTE    INTERPRETER: No    CONTACT INFO FOR OTHER AGENCIES AND MENTAL HEALTH PROVIDERS (IF APPLICABLE): N/a     PROBLEM(S) ADDRESSED IN THIS SESSION: Initial appt       SUBJECTIVE  TODAY'S CHIEF COMPLAINT AND CLINICAL UPDATES IN PATIENT'S WORDS:  1) Chief Complaint (Patient and/or guardian's own words, concerns and expressed thoughts): "I felt like there was never acknowledgment of the anger"     2) New information from patient and/or collateral (Patient's illness: context, course, modifying factors, severity, cultural, family, social, medical history): N/A    OBJECTIVE  DATA REVIEWED (Consider medical labs, radiology, other medical tests; screening/outcome measures; psychological testing; discussion of test results with other clinicians; consultation with other clinicians and systems involved with patient, summary of old records): n/a     CURRENT MEDICATIONS (make clear medications prescribed by psychiatry; include OTC medications):    Current Outpatient Medications:  melatonin 3 MG TABS tablet Take 1 tablet by mouth nightly For sleep Disp: 30 tablet Rfl: 1   fluticasone (FLONASE) 50 MCG/ACT nasal spray 1 spray by Each Nostril route daily for allergies Disp: 1 Bottle Rfl: 12   propranolol (INDERAL) 10 MG tablet Take 1 tablet by mouth 3 (three) times daily as needed For rapid heart rate or anxiety symptoms. Disp: 90 tablet Rfl: 3   THEANINE PO Take  by mouth. Disp:  Rfl:    Multiple Vitamin (MULTIVITAMINS PO)  Disp:  Rfl:      No current facility-administered medications for this visit.     MEDICATION ADHERENCE (including barriers and how addressed):N/A    MEDICATION SIDE EFFECTS (Prescribers Only): N/A    BIRTH CONTROL (ask females and males): N/A    CURRENT PREGNANCY: N/A                                    MENTAL STATUS EXAMINATION                     General Appearance: Within normal limits     Interaction with Interviewer (eye contact, attitude, behavior): Within normal  limits    Physical Signs    Gait and Station (how patient walks and stands): Within normal limits                  Physical Appearance: Within normal limits          Normal Movements: Within normal limits         Speech (rate, volume, articulation): rapid, but normal rate and volume          Language: Within normal limits                       Mood: slightly anxious}            Affect: congruent            Thought Process     Rate; concrete vs abstract reasoning: Within normal limits        Logical vs illogical; associations: loose, tangential, circumstantial, intact: Within normal limits                                    Thought Content    Normal Thought Content (other than safety): Within normal limits     Perceptions (auditory,  visual, tactile, etc.): Within normal limits       Impulse Control: fair         Cognition (Link to [facility])    Orientation (person, place, time): Within normal limits      Recent and remote memory: Within normal limits           Attention span and concentration: Within normal limits         Fund of knowledge, awareness of current events and vocabulary: Within normal limits      Judgment: Within normal limits          Insight: fair        Suicidality/Homicidality/Aggression (Victimization or Perpetration): Within normal limits     ASSESSMENT   Today's Assessment:  First session following evaluation in December. Primary purpose was establishing therapeutic alliance. Pt spoke at length about the work she has done both in therapy and on her own, to better understand her internal experience, as well as to understand the challenges she has faced with therapy providers over the years. Pt does appear to be motivated to develop a healthy therapeutic relationship with a provider she feels comfortable with and was able to discuss her goals for therapy. Spoke what has worked and what has not worked in previous episodes of care.     Risk Level Assessment  Risk Level Change (if yes, please describe):  No    Suicide: low (1)  Violence: low (1)  Addiction: low (1)        Protective Factors: help seeking.     DIAGNOSES ASSESSED TODAY (psychiatric diagnoses and medical diagnoses that factor into management of psychiatric treatment): DID,  PTSD,    CLINICAL FORMULATION (Make changes as your understanding changes. 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 Must coincide with treatment plan.): N/A    PLAN FOR ONGOING TREATMENT:  Continue with OP therapy     INFORMED CONSENT (for any new treatment): N/A      ONLY FOR PRESCRIBERS DOING EVALUATION AND MANAGEMENT VISITS     Use only when no psychotherapy performed  COUNSELING AND COORDINATION OF CARE PROVIDED (Consider diagnostic results/impressions and/or recommended studies; risks and benefits of treatment options; instruction for management/treatment and/or follow-up; importance of compliance with chosen treatment options; risk factor reduction; patient/family/caregiver education; prognosis): N/A      Over 50% of time during today's visit was devoted to counseling and/or coordination of care.  If yes, record estimated duration of the face to face encounter.  N/A    INSTRUCTIONS TO COVERING CLINICIANS:  N/A

## 2017-08-28 NOTE — Progress Notes (Signed)
08/25/17 [facility]   Visit   Visit number 26   POC Due date discharge   Time Calculation   Start Time 1415   Stop Time 1445   Time Calculation (min) 30 min   Pain   Pain Score 0    Manual Therapy   Technique pelvic floor release   Manual Therapy 2   Technique L-S decompression and L-S area release   Manual Therapy 3   Technique dural tube balancing and cranial releases   Manual Therapy 4   Technique CNS down regulation with diaphragm release and CST   Manual Therapy 5   Technique Cranial hold   Ther Exercise   Exercise education regarding self care acts such as holding head, pelvis and diaphragm release with own hands direction of energy   Ther Exercise 2   Exercise relaxation, abdominal breathing   Ther Exercise 3   Exercise 3 self care: STM for diaphragm and pelvis ant   Ther Exercise 4   Exercise 4 cranial hold for self care, use of Still point inducer at home   Ther Exercise 5   Exercise 5 education about practice of relaxation response, guided meditation  (trial of stillpoint inducer in clinic, possibly to purchase)   Patient Education   What was taught? releaxation/self care reviewed and added onto   Method Verbal;Demo;Practice   Patient comprehension Yes     Carola Rhine, PT, Lic # [id number]

## 2017-08-28 NOTE — Progress Notes (Signed)
PHYSICAL THERAPY  DISCHARGE REPORT    DIAGNOSIS: pelvic florr dysfunction, PTSD  MD: Dr. Joanne Chars, MD    Your patient, Brianna [name], has been discharged from Physical Therapy after a total of 13 visits.    REASON(S) FOR DISCHARGE:    This patient has: agreed with PT to do self care and HEP for now. Pt was instructed in use of Still pint inducer and relaxation techniques, restorative Yoga, cranial self mob and cranial hold for CNS down regulation and other interventions like pelvic floor release.    RECOMMENDATIONS:    This patient should: continue HEP/self care    COMMENTS:    If you have any questions please feel free to contact the department at Dept: [phone].    Therapist: Carola Rhine, [place], PT, Lic # [id number]

## 2017-09-02 ENCOUNTER — Ambulatory Visit (HOSPITAL_BASED_OUTPATIENT_CLINIC_OR_DEPARTMENT_OTHER): Payer: Self-pay

## 2017-09-02 NOTE — Dental Note (Signed)
Prescription: Clindamycin 300-[name] Infection  Dosage:  Disp:  15  Duration:   Day(s)  Refills:  0  Provider:  [facility]  Clinic:  [facility]  Sig:  Take one tablet q8h  Notes:    ----- Signed on Thursday, October 08, 2017 at 11:11:00 AM  -----  ----- Provider: [facility] - Aundra Millet, Receptionist -- Clinic: [facility]  -----

## 2017-09-02 NOTE — Dental Note (Signed)
Patient presented for a limited exam/consult.    Patient pain level at beginning of appointment: 1    Interpreter: None    *Medical/[employer] History*  Chief complaint: #3 gum swelling  History of present illness:  Medical history reviewed and updated.    *Exam*  Pertinent findings: PA abcess #3  Radiographs taken:  Radiographs taken to evaluate for:  Radiographic exam:    *Assessment*  Prescription given: Antibiotic  Referral: Given to the patient for RCT  Assessment/Plan: Pt to follow up w/ RCT at [facility].    Will consider crowns    Patient pain level at end of appointment: 1    Patient left ambulatory, satisfied with treatment. Any remaining planned  treatments were explained in detail to the patient.    Assistant: Daine Gip    Next visit: What: crown prep  Reason:    ----- Signed on Wednesday, September 02, 2017 at 4:00:00 PM  -----  ----- Provider: Celedonio Savage,  -- Clinic: [facility] -----  ----- Override Provider: Roney Jaffe, DDS -----

## 2017-09-03 ENCOUNTER — Encounter (HOSPITAL_BASED_OUTPATIENT_CLINIC_OR_DEPARTMENT_OTHER): Payer: Self-pay | Admitting: Ophthalmology

## 2017-09-04 ENCOUNTER — Ambulatory Visit (HOSPITAL_BASED_OUTPATIENT_CLINIC_OR_DEPARTMENT_OTHER): Payer: MEDICARE | Admitting: Ophthalmology

## 2017-09-04 ENCOUNTER — Encounter (HOSPITAL_BASED_OUTPATIENT_CLINIC_OR_DEPARTMENT_OTHER): Payer: Self-pay | Admitting: Ophthalmology

## 2017-09-04 DIAGNOSIS — H524 Presbyopia: Secondary | ICD-10-CM

## 2017-09-04 DIAGNOSIS — H5213 Myopia, bilateral: Secondary | ICD-10-CM

## 2017-09-04 DIAGNOSIS — H04123 Dry eye syndrome of bilateral lacrimal glands: Secondary | ICD-10-CM

## 2017-09-04 NOTE — Progress Notes (Signed)
F/u s/p cataract surgery with Trulign toric IOL OD and Crystalens OS to evaluate decreased vision at near.  Patient stated she needs more light to see at near.    C.o. Gritty eyes.    Impression,    1. Pseudophakia (Crystalens OU)-stable.    2,. Presbyopia-Advised to try OTC +1.25 readers.    3. Dry eyes-advised to use over-the-counter artificial tears such as Refresh, Systane, Theratears, Soothe, or Visine Tears 4 X per day or as needed for symptoms of dry eye.      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

## 2017-09-04 NOTE — Progress Notes (Signed)
62 year old female for F/u,   she had Yag Laser Capsulotomy OD, 06/09/17  For secondary cataract of right eye,    Pt reported difficult to see at dim light, Occ feels something in eyes OU,    No pain,red,itchy,  Ocular medS:  None

## 2017-09-04 NOTE — Patient Instructions (Signed)
Brianna [name]    Use over-the-counter artificial tears such as Refresh, Systane, Theratears, Soothe, or Visine Tears 4 X per day or as needed for symptoms of dry eye.

## 2017-09-07 ENCOUNTER — Ambulatory Visit (HOSPITAL_BASED_OUTPATIENT_CLINIC_OR_DEPARTMENT_OTHER): Payer: MEDICARE | Admitting: Emergency Medicine

## 2017-09-07 DIAGNOSIS — F449 Dissociative and conversion disorder, unspecified: Secondary | ICD-10-CM

## 2017-09-07 DIAGNOSIS — F4310 Post-traumatic stress disorder, unspecified: Secondary | ICD-10-CM

## 2017-09-10 NOTE — Progress Notes (Signed)
OUTPATIENT PSYCHIATRY PROGRESS NOTE    INTERPRETER: No    CONTACT INFO FOR OTHER AGENCIES AND MENTAL HEALTH PROVIDERS (IF APPLICABLE): N/a     PROBLEM(S) ADDRESSED IN THIS SESSION: Initial appt       SUBJECTIVE  TODAY'S CHIEF COMPLAINT AND CLINICAL UPDATES IN PATIENT'S WORDS:  1) Chief Complaint (Patient and/or guardian's own words, concerns and expressed thoughts): "I feel like I"m not human"     2) New information from patient and/or collateral (Patient's illness: context, course, modifying factors, severity, cultural, family, social, medical history): N/A    OBJECTIVE  DATA REVIEWED (Consider medical labs, radiology, other medical tests; screening/outcome measures; psychological testing; discussion of test results with other clinicians; consultation with other clinicians and systems involved with patient, summary of old records): n/a     CURRENT MEDICATIONS (make clear medications prescribed by psychiatry; include OTC medications):    Current Outpatient Medications:  propranolol (INDERAL) 10 MG tablet Take 1 tablet by mouth 3 (three) times daily as needed For rapid heart rate or anxiety symptoms. Disp: 90 tablet Rfl: 3   melatonin 3 MG TABS tablet Take 1 tablet by mouth nightly For sleep Disp: 30 tablet Rfl: 1   fluticasone (FLONASE) 50 MCG/ACT nasal spray 1 spray by Each Nostril route daily for allergies Disp: 1 Bottle Rfl: 12   THEANINE PO Take  by mouth. Disp:  Rfl:    Multiple Vitamin (MULTIVITAMINS PO)  Disp:  Rfl:      No current facility-administered medications for this visit.     MEDICATION ADHERENCE (including barriers and how addressed):N/A    MEDICATION SIDE EFFECTS (Prescribers Only): N/A    BIRTH CONTROL (ask females and males): N/A    CURRENT PREGNANCY: N/A                                    MENTAL STATUS EXAMINATION                     General Appearance: Within normal limits     Interaction with Interviewer (eye contact, attitude, behavior): Within normal limits    Physical Signs    Gait and  Station (how patient walks and stands): Within normal limits                  Physical Appearance: Within normal limits          Normal Movements: Within normal limits         Speech (rate, volume, articulation): rapid, but normal rate and volume          Language: Within normal limits                       Mood: slightly anxious}            Affect: congruent            Thought Process     Rate; concrete vs abstract reasoning: Within normal limits        Logical vs illogical; associations: loose, tangential, circumstantial, intact: Within normal limits                                    Thought Content    Normal Thought Content (other than safety): Within normal limits     Perceptions (auditory, visual, tactile, etc.): Within  normal limits       Impulse Control: fair         Cognition (Link to [facility])    Orientation (person, place, time): Within normal limits      Recent and remote memory: Within normal limits           Attention span and concentration: Within normal limits         Fund of knowledge, awareness of current events and vocabulary: Within normal limits      Judgment: Within normal limits          Insight: fair        Suicidality/Homicidality/Aggression (Victimization or Perpetration): Within normal limits     ASSESSMENT   Today's Assessment:  Discussed pt's hopes again for therapy and goal of focusing on improving alexithymia as pt feels she has never had emotions/feelings mirrored or supported since early childhood and therefore has difficulty identifying emotions.   Risk Level Assessment  Risk Level Change (if yes, please describe): No    Suicide: low (1)  Violence: low (1)  Addiction: low (1)        Protective Factors: help seeking.     DIAGNOSES ASSESSED TODAY (psychiatric diagnoses and medical diagnoses that factor into management of psychiatric treatment): DID,  PTSD,    CLINICAL FORMULATION (Make changes as your understanding changes. 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 Must coincide with treatment plan.): N/A    PLAN FOR ONGOING TREATMENT:  Continue with OP therapy     INFORMED CONSENT (for any new treatment): N/A      ONLY FOR PRESCRIBERS DOING EVALUATION AND MANAGEMENT VISITS     Use only when no psychotherapy performed  COUNSELING AND COORDINATION OF CARE PROVIDED (Consider diagnostic results/impressions and/or recommended studies; risks and benefits of treatment options; instruction for management/treatment and/or follow-up; importance of compliance with chosen treatment options; risk factor reduction; patient/family/caregiver education; prognosis): N/A      Over 50% of time during today's visit was devoted to counseling and/or coordination of care.  If yes, record estimated duration of the face to face encounter.  N/A    INSTRUCTIONS TO COVERING CLINICIANS:  N/A

## 2017-09-14 ENCOUNTER — Ambulatory Visit (HOSPITAL_BASED_OUTPATIENT_CLINIC_OR_DEPARTMENT_OTHER): Payer: MEDICARE | Admitting: Emergency Medicine

## 2017-09-14 DIAGNOSIS — F449 Dissociative and conversion disorder, unspecified: Secondary | ICD-10-CM

## 2017-09-14 NOTE — Progress Notes (Signed)
OUTPATIENT PSYCHIATRY PROGRESS NOTE    INTERPRETER: No    CONTACT INFO FOR OTHER AGENCIES AND MENTAL HEALTH PROVIDERS (IF APPLICABLE): N/a     PROBLEM(S) ADDRESSED IN THIS SESSION: Initial appt       SUBJECTIVE  TODAY'S CHIEF COMPLAINT AND CLINICAL UPDATES IN PATIENT'S WORDS:  1) Chief Complaint (Patient and/or guardian's own words, concerns and expressed thoughts): "It was really painful"     2) New information from patient and/or collateral (Patient's illness: context, course, modifying factors, severity, cultural, family, social, medical history): N/A    OBJECTIVE  DATA REVIEWED (Consider medical labs, radiology, other medical tests; screening/outcome measures; psychological testing; discussion of test results with other clinicians; consultation with other clinicians and systems involved with patient, summary of old records): n/a     CURRENT MEDICATIONS (make clear medications prescribed by psychiatry; include OTC medications):    Current Outpatient Medications:  propranolol (INDERAL) 10 MG tablet Take 1 tablet by mouth 3 (three) times daily as needed For rapid heart rate or anxiety symptoms. Disp: 90 tablet Rfl: 3   melatonin 3 MG TABS tablet Take 1 tablet by mouth nightly For sleep Disp: 30 tablet Rfl: 1   fluticasone (FLONASE) 50 MCG/ACT nasal spray 1 spray by Each Nostril route daily for allergies Disp: 1 Bottle Rfl: 12   THEANINE PO Take  by mouth. Disp:  Rfl:    Multiple Vitamin (MULTIVITAMINS PO)  Disp:  Rfl:      No current facility-administered medications for this visit.     MEDICATION ADHERENCE (including barriers and how addressed):N/A    MEDICATION SIDE EFFECTS (Prescribers Only): N/A    BIRTH CONTROL (ask females and males): N/A    CURRENT PREGNANCY: N/A                                    MENTAL STATUS EXAMINATION                     General Appearance: Within normal limits     Interaction with Interviewer (eye contact, attitude, behavior): Within normal limits    Physical Signs    Gait and Station  (how patient walks and stands): Within normal limits                  Physical Appearance: Within normal limits          Normal Movements: Within normal limits         Speech (rate, volume, articulation): rapid, but normal rate and volume          Language: Within normal limits                       Mood: slightly anxious}            Affect: congruent            Thought Process     Rate; concrete vs abstract reasoning: Within normal limits        Logical vs illogical; associations: loose, tangential, circumstantial, intact: Within normal limits                                    Thought Content    Normal Thought Content (other than safety): Within normal limits     Perceptions (auditory, visual, tactile, etc.): Within normal limits  Impulse Control: fair         Cognition (Link to [facility])    Orientation (person, place, time): Within normal limits      Recent and remote memory: Within normal limits           Attention span and concentration: Within normal limits         Fund of knowledge, awareness of current events and vocabulary: Within normal limits      Judgment: Within normal limits          Insight: fair        Suicidality/Homicidality/Aggression (Victimization or Perpetration): Within normal limits     ASSESSMENT   Today's Assessment:  Explored recent experience and worked on helping pt identify emotion and connecting internal sensation to emotion. Explored ways in which prior therapeutic experiences have been painful and pt was responsive and coopertive    Risk Level Assessment  Risk Level Change (if yes, please describe): No    Suicide: low (1)  Violence: low (1)  Addiction: low (1)        Protective Factors: help seeking.     DIAGNOSES ASSESSED TODAY (psychiatric diagnoses and medical diagnoses that factor into management of psychiatric treatment): DID unspecified ,  PTSD,    CLINICAL FORMULATION (Make changes as your understanding changes. 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 Pt represents for [facility] services to continue this work and has several strengths including intelligence, sense of humor and persistence.     REVIEWING TODAY'S VISIT  CLINICAL INTERVENTIONS TODAY: Continue to estable therapeutic alliance and begin working to increase emotional literacy     PATIENT'S RESPONSE TO INTERVENTIONS: cooperative     PROGRESS TOWARDS GOALS: n/a     TIME SPENT IN PSYCHOTHERAPY: 45 min     PLAN  PLAN FOR MANAGING RISK (Consider risk plan for patients at moderate or high risk for suicide/violence/addiction; medication plan; referrals, etc. Must coincide with treatment plan.): N/A    PLAN FOR ONGOING TREATMENT:  Continue with OP therapy     INFORMED CONSENT (for any new treatment): N/A      ONLY FOR PRESCRIBERS DOING EVALUATION AND MANAGEMENT VISITS     Use only when no psychotherapy performed  COUNSELING AND COORDINATION OF CARE PROVIDED (Consider diagnostic results/impressions and/or recommended studies; risks and benefits of treatment options; instruction for management/treatment and/or follow-up; importance of compliance with chosen treatment options; risk factor reduction; patient/family/caregiver education; prognosis): N/A      Over 50% of time during today's visit was devoted to counseling and/or coordination of care.  If yes, record estimated duration of the face to face encounter.  N/A    INSTRUCTIONS TO COVERING CLINICIANS:  N/A

## 2017-09-21 ENCOUNTER — Ambulatory Visit (HOSPITAL_BASED_OUTPATIENT_CLINIC_OR_DEPARTMENT_OTHER): Payer: MEDICARE | Admitting: Emergency Medicine

## 2017-09-21 DIAGNOSIS — F449 Dissociative and conversion disorder, unspecified: Secondary | ICD-10-CM

## 2017-09-21 NOTE — Progress Notes (Signed)
OUTPATIENT PSYCHIATRY PROGRESS NOTE    INTERPRETER: No    CONTACT INFO FOR OTHER AGENCIES AND MENTAL HEALTH PROVIDERS (IF APPLICABLE): N/a     PROBLEM(S) ADDRESSED IN THIS SESSION: dyadic relationships    SUBJECTIVE  TODAY'S CHIEF COMPLAINT AND CLINICAL UPDATES IN PATIENT'S WORDS:  1) Chief Complaint (Patient and/or guardian's own words, concerns and expressed thoughts): "I think it's important for me to say when something doesn't sit with me"     2) New information from patient and/or collateral (Patient's illness: context, course, modifying factors, severity, cultural, family, social, medical history): N/A    OBJECTIVE  DATA REVIEWED (Consider medical labs, radiology, other medical tests; screening/outcome measures; psychological testing; discussion of test results with other clinicians; consultation with other clinicians and systems involved with patient, summary of old records): n/a     CURRENT MEDICATIONS (make clear medications prescribed by psychiatry; include OTC medications):    Current Outpatient Medications:  propranolol (INDERAL) 10 MG tablet Take 1 tablet by mouth 3 (three) times daily as needed For rapid heart rate or anxiety symptoms. Disp: 90 tablet Rfl: 3   melatonin 3 MG TABS tablet Take 1 tablet by mouth nightly For sleep Disp: 30 tablet Rfl: 1   fluticasone (FLONASE) 50 MCG/ACT nasal spray 1 spray by Each Nostril route daily for allergies Disp: 1 Bottle Rfl: 12   THEANINE PO Take  by mouth. Disp:  Rfl:    Multiple Vitamin (MULTIVITAMINS PO)  Disp:  Rfl:      No current facility-administered medications for this visit.     MEDICATION ADHERENCE (including barriers and how addressed):N/A    MEDICATION SIDE EFFECTS (Prescribers Only): N/A    BIRTH CONTROL (ask females and males): N/A    CURRENT PREGNANCY: N/A                                    MENTAL STATUS EXAMINATION                     General Appearance: Within normal limits     Interaction with Interviewer (eye contact, attitude, behavior):  Within normal limits    Physical Signs    Gait and Station (how patient walks and stands): Within normal limits                  Physical Appearance: Within normal limits          Normal Movements: Within normal limits         Speech (rate, volume, articulation): rapid, but normal rate and volume          Language: Within normal limits                       Mood: slightly anxious}            Affect: congruent            Thought Process     Rate; concrete vs abstract reasoning: Within normal limits        Logical vs illogical; associations: loose, tangential, circumstantial, intact: Within normal limits                                    Thought Content    Normal Thought Content (other than safety): Within normal limits     Perceptions (  auditory, visual, tactile, etc.): Within normal limits       Impulse Control: fair         Cognition (Link to [facility])    Orientation (person, place, time): Within normal limits      Recent and remote memory: Within normal limits           Attention span and concentration: Within normal limits         Fund of knowledge, awareness of current events and vocabulary: Within normal limits      Judgment: Within normal limits          Insight: fair        Suicidality/Homicidality/Aggression (Victimization or Perpetration): Within normal limits     ASSESSMENT   Today's Assessment:  Pt reports feeling more vulnerable today; explored fear that she would be blamed in therapy if she became impatient or raised her voice. Explored this fear as well as exploring effective interventions to increase emotional literacy    Risk Level Assessment  Risk Level Change (if yes, please describe): No    Suicide: low (1)  Violence: low (1)  Addiction: low (1)        Protective Factors: help seeking.     DIAGNOSES ASSESSED TODAY (psychiatric diagnoses and medical diagnoses that factor into management of psychiatric treatment): DID unspecified ,  PTSD,    CLINICAL FORMULATION (Make changes as your understanding  changes. 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 Pt represents for [facility] services to continue this work and has several strengths including intelligence, sense of humor and persistence.     REVIEWING TODAY'S VISIT  CLINICAL INTERVENTIONS TODAY: Continue to estable therapeutic alliance, exploring dyadic relationship, increasing emotional literacy      PATIENT'S RESPONSE TO INTERVENTIONS: cooperative     PROGRESS TOWARDS GOALS: n/a     TIME SPENT IN PSYCHOTHERAPY: 45 min     PLAN  PLAN FOR MANAGING RISK (Consider risk plan for patients at moderate or high risk for suicide/violence/addiction; medication plan; referrals, etc. Must coincide with treatment plan.): N/A    PLAN FOR ONGOING TREATMENT:  Continue with OP therapy     INFORMED CONSENT (for any new treatment): N/A      ONLY FOR PRESCRIBERS DOING EVALUATION AND MANAGEMENT VISITS     Use only when no psychotherapy performed  COUNSELING AND COORDINATION OF CARE PROVIDED (Consider diagnostic results/impressions and/or recommended studies; risks and benefits of treatment options; instruction for management/treatment and/or follow-up; importance of compliance with chosen treatment options; risk factor reduction; patient/family/caregiver education; prognosis): N/A      Over 50% of time during today's visit was devoted to counseling and/or coordination of care.  If yes, record estimated duration of the face to face encounter.  N/A    INSTRUCTIONS TO COVERING CLINICIANS:  N/A

## 2017-09-23 ENCOUNTER — Encounter (HOSPITAL_BASED_OUTPATIENT_CLINIC_OR_DEPARTMENT_OTHER): Payer: Self-pay | Admitting: Ophthalmology

## 2017-09-24 ENCOUNTER — Encounter (HOSPITAL_BASED_OUTPATIENT_CLINIC_OR_DEPARTMENT_OTHER): Payer: Self-pay | Admitting: Family Medicine

## 2017-09-24 ENCOUNTER — Ambulatory Visit (HOSPITAL_BASED_OUTPATIENT_CLINIC_OR_DEPARTMENT_OTHER): Payer: MEDICARE | Admitting: Family Medicine

## 2017-09-24 VITALS — BP 100/60 | Wt 112.0 lb

## 2017-09-24 DIAGNOSIS — F331 Major depressive disorder, recurrent, moderate: Secondary | ICD-10-CM

## 2017-09-24 DIAGNOSIS — F4312 Post-traumatic stress disorder, chronic: Secondary | ICD-10-CM

## 2017-09-24 DIAGNOSIS — F449 Dissociative and conversion disorder, unspecified: Secondary | ICD-10-CM

## 2017-09-24 DIAGNOSIS — R481 Agnosia: Principal | ICD-10-CM

## 2017-09-24 NOTE — Progress Notes (Signed)
SUBJECTIVE: Brianna [name] is a 62 year old female with the following Problems and Medications.    Patient Active Problem List:     Post-traumatic stress disorder, chronic     FATIGUE GENERAL     Family history of malignant neoplasm of breast     Screening for unspecified condition     Dissociative disorder or reaction, unspecified     Major depressive disorder, recurrent episode, moderate ([place])     Bunion     Vitamin D deficiency     High myopia, both eyes     Pseudophakia     History of osteopenia     Urge incontinence     Other microscopic hematuria     External hemorrhoids     Not immune to hepatitis B virus     Elevated hemoglobin A1c     Hot flashes      Current Outpatient Medications:  propranolol (INDERAL) 10 MG tablet Take 1 tablet by mouth 3 (three) times daily as needed For rapid heart rate or anxiety symptoms. Disp: 90 tablet Rfl: 3   melatonin 3 MG TABS tablet Take 1 tablet by mouth nightly For sleep Disp: 30 tablet Rfl: 1   fluticasone (FLONASE) 50 MCG/ACT nasal spray 1 spray by Each Nostril route daily for allergies Disp: 1 Bottle Rfl: 12   THEANINE PO Take  by mouth. Disp:  Rfl:    Multiple Vitamin (MULTIVITAMINS PO)  Disp:  Rfl:      No current facility-administered medications for this visit.   Review of Patient's Allergies indicates:   Penicillins             Hives    Comment:childhood.     Here for ongoing follow up     Thinks she is Feeling more anxious.  Never started nasal spray, worried that it would affect cataracts or that she would use it incorrectly (has already had cat extrac bilaterally) . st5ill can't taste anything.  Feels like taste is one of her few pleasures.    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  Feels like she can't get throuhg her accumulated things and the piles are getting in her way.  Feels like has been losing control over a  long time and things are now more out o control.      Used to get angry really quickly, to fast to control herself,  Has had better luck taking a breath, but still does not feel like that's reliable control.    "I feel paranoid" - read package inserts and did not start any of her medications because afraid of side effects.    Hard to get motivated, "I think I'm allergic to my own energy" - any time she feels like accomplishing something she gets a "hot flash" and desists.  Frustrated with how long it's taking to get better.    OBJECTIVE: BP 100/60 (Site: LA, Position: Sitting, Cuff Size: Reg)  Wt 50.8 kg (112 lb)  LMP  (LMP Unknown)  BMI 20.1 kg/m2 Tired appearing, engaged, anxious, tearful intermittently.  Speech is fluent, somewhat perseverant on her anxiety.  Grooming is good, dress appropriate for weather.      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  Plan: RFL TO ADULT OUTPT PSYCH (NEW) [facility]        Strength-based counseling, enc continue MH appts    (F33.1) Major depressive disorder, recurrent episode, moderate ([facility])  Comment: as above, her anxiety is really paralyzing to her at this point, very unhappya bout it. Would likely benefit from pharmacotherapy however she is a complicated case, will refer.  Plan: RFL TO ADULT OUTPT PSYCH (NEW) [facility]            (F43.12) Post-traumatic stress disorder, chronic  Comment: as above  Plan: RFL TO ADULT OUTPT PSYCH (NEW) [facility]            1 mo follow up or sooner prn.

## 2017-09-28 ENCOUNTER — Ambulatory Visit (HOSPITAL_BASED_OUTPATIENT_CLINIC_OR_DEPARTMENT_OTHER): Payer: MEDICARE | Admitting: Emergency Medicine

## 2017-09-28 DIAGNOSIS — F449 Dissociative and conversion disorder, unspecified: Principal | ICD-10-CM

## 2017-09-29 ENCOUNTER — Ambulatory Visit (HOSPITAL_BASED_OUTPATIENT_CLINIC_OR_DEPARTMENT_OTHER): Payer: Self-pay

## 2017-09-29 NOTE — Dental Note (Signed)
Patient presented for an amalgam restoration #None, .    Patient pain level at beginning of appointment: 0    Interpreter: None    *Medical/Logyn Dedominicis History*  Chief complaint:  HPI:  Medical history reviewed and updated.    *Amalgam Restoration*  Reason(s) for restoration(s): Fractured restoration  Radiographs taken:  Radiographs taken to evaluate for:  Local anesthetic used: 4% Articaine (1:100,000 epinephrine) and 2% Lidocaine  (1:100,000 epinephrine)  Whole number of carpules used: 2  Type of injection: Maxillary infiltration and Middle superior alveolar nerve  block  Existing material removed: Amalgam  Liner and base placed: Lime-Lite    The tooth was prepared with adequate water irrigation. GS-80 fast set amalgam  was placed, condensed, and burnished. Restoration was carved to ideal  anatomy. Margins, contacts, and occlusion were checked and adjusted as  needed. Post-op instructions given. Patient instructed not to bite on this   side   of the mouth for at least 24 hours.    *Assessment*  Prescription given:  Referral:  Assessment/Plan: 111/77    Patient pain level at end of appointment: 0    Patient left ambulatory, satisfied with treatment. Any remaining planned  treatments were explained in detail to the patient.    Assistant: Cora Daniels    Next visit: What: crown prep #19  Reason:    ----- Signed on Tuesday, September 29, 2017 at 11:51:29 AM  -----  ----- Provider: [facility]: Alwyn Ren -----  ----- Override Provider: Roney Jaffe, DDS -----

## 2017-09-29 NOTE — Progress Notes (Signed)
OUTPATIENT PSYCHIATRY PROGRESS NOTE    INTERPRETER: No    CONTACT INFO FOR OTHER AGENCIES AND MENTAL HEALTH PROVIDERS (IF APPLICABLE): N/a     PROBLEM(S) ADDRESSED IN THIS SESSION:interpersonal relatinship styles     SUBJECTIVE  TODAY'S CHIEF COMPLAINT AND CLINICAL UPDATES IN PATIENT'S WORDS:  1) Chief Complaint (Patient and/or guardian's own words, concerns and expressed thoughts): "It's like people don't understand this kind of pain"     2) New information from patient and/or collateral (Patient's illness: context, course, modifying factors, severity, cultural, family, social, medical history): N/A    OBJECTIVE  DATA REVIEWED (Consider medical labs, radiology, other medical tests; screening/outcome measures; psychological testing; discussion of test results with other clinicians; consultation with other clinicians and systems involved with patient, summary of old records): n/a     CURRENT MEDICATIONS (make clear medications prescribed by psychiatry; include OTC medications):    Current Outpatient Medications:  propranolol (INDERAL) 10 MG tablet Take 1 tablet by mouth 3 (three) times daily as needed For rapid heart rate or anxiety symptoms. Disp: 90 tablet Rfl: 3   melatonin 3 MG TABS tablet Take 1 tablet by mouth nightly For sleep Disp: 30 tablet Rfl: 1   fluticasone (FLONASE) 50 MCG/ACT nasal spray 1 spray by Each Nostril route daily for allergies Disp: 1 Bottle Rfl: 12   THEANINE PO Take  by mouth. Disp:  Rfl:    Multiple Vitamin (MULTIVITAMINS PO)  Disp:  Rfl:      No current facility-administered medications for this visit.     MEDICATION ADHERENCE (including barriers and how addressed):N/A    MEDICATION SIDE EFFECTS (Prescribers Only): N/A    BIRTH CONTROL (ask females and males): N/A    CURRENT PREGNANCY: N/A                                    MENTAL STATUS EXAMINATION                     General Appearance: Within normal limits     Interaction with Interviewer (eye contact, attitude, behavior): Within  normal limits    Physical Signs    Gait and Station (how patient walks and stands): Within normal limits                  Physical Appearance: Within normal limits          Normal Movements: Within normal limits         Speech (rate, volume, articulation): rapid, but normal rate and volume          Language: Within normal limits                       Mood: slightly anxious}            Affect: congruent            Thought Process     Rate; concrete vs abstract reasoning: Within normal limits        Logical vs illogical; associations: loose, tangential, circumstantial, intact: Within normal limits                                    Thought Content    Normal Thought Content (other than safety): Within normal limits     Perceptions (auditory, visual, tactile, etc.):  Within normal limits       Impulse Control: fair         Cognition (Link to [facility])    Orientation (person, place, time): Within normal limits      Recent and remote memory: Within normal limits           Attention span and concentration: Within normal limits         Fund of knowledge, awareness of current events and vocabulary: Within normal limits      Judgment: Within normal limits          Insight: fair        Suicidality/Homicidality/Aggression (Victimization or Perpetration): Within normal limits     ASSESSMENT   Today's Assessment:  Explored difficulty of explaining pt's dx to people during times she is willing to do so, explored feelings associated with loss of normative childhood experiences that would have likely led to increased interpersonal effectiveness. Identified rupture and repaired. Identified concrete example where friend felt misunderstood by client    Risk Level Assessment  Risk Level Change (if yes, please describe): No    Suicide: low (1)  Violence: low (1)  Addiction: low (1)        Protective Factors: help seeking.     DIAGNOSES ASSESSED TODAY (psychiatric diagnoses and medical diagnoses that factor into management of psychiatric  treatment): DID unspecified ,  PTSD,    CLINICAL FORMULATION (Make changes as your understanding changes. 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 Pt represents for [facility] services to continue this work and has several strengths including intelligence, sense of humor and persistence.     REVIEWING TODAY'S VISIT  CLINICAL INTERVENTIONS TODAY: Continue to estable therapeutic alliance, exploring dyadic relationship, increasing emotional literacy      PATIENT'S RESPONSE TO INTERVENTIONS: cooperative     PROGRESS TOWARDS GOALS: n/a     TIME SPENT IN PSYCHOTHERAPY: 45 min     PLAN  PLAN FOR MANAGING RISK (Consider risk plan for patients at moderate or high risk for suicide/violence/addiction; medication plan; referrals, etc. Must coincide with treatment plan.): N/A    PLAN FOR ONGOING TREATMENT:  Continue with OP therapy     INFORMED CONSENT (for any new treatment): N/A      ONLY FOR PRESCRIBERS DOING EVALUATION AND MANAGEMENT VISITS     Use only when no psychotherapy performed  COUNSELING AND COORDINATION OF CARE PROVIDED (Consider diagnostic results/impressions and/or recommended studies; risks and benefits of treatment options; instruction for management/treatment and/or follow-up; importance of compliance with chosen treatment options; risk factor reduction; patient/family/caregiver education; prognosis): N/A      Over 50% of time during today's visit was devoted to counseling and/or coordination of care.  If yes, record estimated duration of the face to face  encounter.  N/A    INSTRUCTIONS TO COVERING CLINICIANS:  N/A

## 2017-10-02 ENCOUNTER — Other Ambulatory Visit (HOSPITAL_BASED_OUTPATIENT_CLINIC_OR_DEPARTMENT_OTHER): Payer: Self-pay | Admitting: Social Worker

## 2017-10-02 ENCOUNTER — Encounter (HOSPITAL_BASED_OUTPATIENT_CLINIC_OR_DEPARTMENT_OTHER): Payer: Self-pay | Admitting: Family Medicine

## 2017-10-02 NOTE — Progress Notes (Signed)
10/02/17-Thank you for your referral. We were able to contact your patient. she was given an appointment for 11/11/17 with Gonzella Lex at [facility]

## 2017-10-05 ENCOUNTER — Ambulatory Visit (HOSPITAL_BASED_OUTPATIENT_CLINIC_OR_DEPARTMENT_OTHER): Payer: MEDICARE | Admitting: Emergency Medicine

## 2017-10-05 DIAGNOSIS — F449 Dissociative and conversion disorder, unspecified: Secondary | ICD-10-CM

## 2017-10-06 NOTE — Progress Notes (Signed)
OUTPATIENT PSYCHIATRY PROGRESS NOTE    INTERPRETER: No    CONTACT INFO FOR OTHER AGENCIES AND MENTAL HEALTH PROVIDERS (IF APPLICABLE): N/a     PROBLEM(S) ADDRESSED IN THIS SESSION: psychic pain     SUBJECTIVE  TODAY'S CHIEF COMPLAINT AND CLINICAL UPDATES IN PATIENT'S WORDS:  1) Chief Complaint (Patient and/or guardian's own words, concerns and expressed thoughts): "I feel like it's bigger than me"     2) New information from patient and/or collateral (Patient's illness: context, course, modifying factors, severity, cultural, family, social, medical history): N/A    OBJECTIVE  DATA REVIEWED (Consider medical labs, radiology, other medical tests; screening/outcome measures; psychological testing; discussion of test results with other clinicians; consultation with other clinicians and systems involved with patient, summary of old records): n/a     CURRENT MEDICATIONS (make clear medications prescribed by psychiatry; include OTC medications):    Current Outpatient Medications:  propranolol (INDERAL) 10 MG tablet Take 1 tablet by mouth 3 (three) times daily as needed For rapid heart rate or anxiety symptoms. Disp: 90 tablet Rfl: 3   melatonin 3 MG TABS tablet Take 1 tablet by mouth nightly For sleep Disp: 30 tablet Rfl: 1   fluticasone (FLONASE) 50 MCG/ACT nasal spray 1 spray by Each Nostril route daily for allergies Disp: 1 Bottle Rfl: 12   THEANINE PO Take  by mouth. Disp:  Rfl:    Multiple Vitamin (MULTIVITAMINS PO)  Disp:  Rfl:      No current facility-administered medications for this visit.     MEDICATION ADHERENCE (including barriers and how addressed):N/A    MEDICATION SIDE EFFECTS (Prescribers Only): N/A    BIRTH CONTROL (ask females and males): N/A    CURRENT PREGNANCY: N/A                                    MENTAL STATUS EXAMINATION                     General Appearance: Within normal limits     Interaction with Interviewer (eye contact, attitude, behavior): Within normal limits    Physical Signs    Gait and  Station (how patient walks and stands): Within normal limits                  Physical Appearance: Within normal limits          Normal Movements: Within normal limits         Speech (rate, volume, articulation): rapid, but normal rate and volume          Language: Within normal limits                       Mood: "vulnerable"}            Affect: crying, depressed            Thought Process     Rate; concrete vs abstract reasoning: Within normal limits        Logical vs illogical; associations: loose, tangential, circumstantial, intact: Within normal limits                                    Thought Content    Normal Thought Content (other than safety): Within normal limits     Perceptions (auditory, visual, tactile, etc.): Within normal  limits       Impulse Control: fair         Cognition (Link to [facility])    Orientation (person, place, time): Within normal limits      Recent and remote memory: Within normal limits           Attention span and concentration: Within normal limits         Fund of knowledge, awareness of current events and vocabulary: Within normal limits      Judgment: Within normal limits          Insight: fair        Suicidality/Homicidality/Aggression (Victimization or Perpetration): Within normal limits     ASSESSMENT   Today's Assessment:  Explored pt's pain, how she felt unable to experience this in childhood, how it evokes shame, feelings of neediness and difficulty of both receiving help and asking for support in effective ways.  Discussed possibility of needing inpatient admission    Risk Level Assessment  Risk Level Change (if yes, please describe): No    Suicide: low (1) to moderate (2) - feeling increasingly hopeless  Violence: low (1)  Addiction: low (1)        Protective Factors: help seeking.     DIAGNOSES ASSESSED TODAY (psychiatric diagnoses and medical diagnoses that factor into management of psychiatric treatment): DID unspecified ,  PTSD,    CLINICAL FORMULATION (Make changes as your  understanding changes. 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 Pt represents for [facility] services to continue this work and has several strengths including intelligence, sense of humor and persistence.     REVIEWING TODAY'S VISIT  CLINICAL INTERVENTIONS TODAY: Continue to estable therapeutic alliance, exploring psychic pain, increasing emotional literacy      PATIENT'S RESPONSE TO INTERVENTIONS: cooperative     PROGRESS TOWARDS GOALS: n/a     TIME SPENT IN PSYCHOTHERAPY: 45 min     PLAN  PLAN FOR MANAGING RISK (Consider risk plan for patients at moderate or high risk for suicide/violence/addiction; medication plan; referrals, etc. Must coincide with treatment plan.): N/A    PLAN FOR ONGOING TREATMENT:  Continue with OP therapy     INFORMED CONSENT (for any new treatment): N/A      ONLY FOR PRESCRIBERS DOING EVALUATION AND MANAGEMENT VISITS     Use only when no psychotherapy performed  COUNSELING AND COORDINATION OF CARE PROVIDED (Consider diagnostic results/impressions and/or recommended studies; risks and benefits of treatment options; instruction for management/treatment and/or follow-up; importance of compliance with chosen treatment options; risk factor reduction; patient/family/caregiver education; prognosis): N/A      Over 50% of time during today's visit was devoted to counseling and/or coordination of care.  If yes, record estimated duration of the face to face encounter.  N/A    INSTRUCTIONS TO COVERING CLINICIANS:  N/A

## 2017-10-12 ENCOUNTER — Ambulatory Visit (HOSPITAL_BASED_OUTPATIENT_CLINIC_OR_DEPARTMENT_OTHER): Payer: MEDICARE | Admitting: Emergency Medicine

## 2017-10-12 DIAGNOSIS — F449 Dissociative and conversion disorder, unspecified: Principal | ICD-10-CM

## 2017-10-12 DIAGNOSIS — F4310 Post-traumatic stress disorder, unspecified: Secondary | ICD-10-CM

## 2017-10-13 ENCOUNTER — Encounter (HOSPITAL_BASED_OUTPATIENT_CLINIC_OR_DEPARTMENT_OTHER): Payer: Self-pay | Admitting: Otolaryngology

## 2017-10-13 NOTE — Progress Notes (Signed)
OUTPATIENT PSYCHIATRY PROGRESS NOTE    INTERPRETER: No    CONTACT INFO FOR OTHER AGENCIES AND MENTAL HEALTH PROVIDERS (IF APPLICABLE): N/a     PROBLEM(S) ADDRESSED IN THIS SESSION: attachment relationships     SUBJECTIVE  TODAY'S CHIEF COMPLAINT AND CLINICAL UPDATES IN PATIENT'S WORDS:  1) Chief Complaint (Patient and/or guardian's own words, concerns and expressed thoughts): "I feel like I am not getting the empathic connection     2) New information from patient and/or collateral (Patient's illness: context, course, modifying factors, severity, cultural, family, social, medical history): N/A    OBJECTIVE  DATA REVIEWED (Consider medical labs, radiology, other medical tests; screening/outcome measures; psychological testing; discussion of test results with other clinicians; consultation with other clinicians and systems involved with patient, summary of old records): n/a     CURRENT MEDICATIONS (make clear medications prescribed by psychiatry; include OTC medications):    Current Outpatient Medications:  propranolol (INDERAL) 10 MG tablet Take 1 tablet by mouth 3 (three) times daily as needed For rapid heart rate or anxiety symptoms. Disp: 90 tablet Rfl: 3   fluticasone (FLONASE) 50 MCG/ACT nasal spray 1 spray by Each Nostril route daily for allergies Disp: 1 Bottle Rfl: 12   THEANINE PO Take  by mouth. Disp:  Rfl:    Multiple Vitamin (MULTIVITAMINS PO)  Disp:  Rfl:      No current facility-administered medications for this visit.     MEDICATION ADHERENCE (including barriers and how addressed):N/A    MEDICATION SIDE EFFECTS (Prescribers Only): N/A    BIRTH CONTROL (ask females and males): N/A    CURRENT PREGNANCY: N/A                                    MENTAL STATUS EXAMINATION                     General Appearance: Within normal limits     Interaction with Interviewer (eye contact, attitude, behavior): often averting gaze,often closing eyes    Physical Signs    Gait and Station (how patient walks and stands):  Within normal limits                  Physical Appearance: Within normal limits          Normal Movements: Within normal limits         Speech (rate, volume, articulation): variable speed, low volume          Language: Within normal limits                       Mood: depressed            Affect: crying, depressed            Thought Process     Rate; concrete vs abstract reasoning: Within normal limits        Logical vs illogical; associations: loose, tangential, circumstantial, intact: Within normal limits                                    Thought Content    Normal Thought Content (other than safety): Within normal limits     Perceptions (auditory, visual, tactile, etc.): Within normal limits       Impulse Control: fair  Cognition (Link to [facility])    Orientation (person, place, time): Within normal limits      Recent and remote memory: Within normal limits           Attention span and concentration: Within normal limits         Fund of knowledge, awareness of current events and vocabulary: Within normal limits      Judgment: Within normal limits          Insight: fair        Suicidality/Homicidality/Aggression (Victimization or Perpetration): Within normal limits     ASSESSMENT   Today's Assessment:  Continued to explore pt's need for empathic connection, sensitivity to nonverbal cues, and how this sensitivity evokes shame.     Risk Level Assessment  Risk Level Change (if yes, please describe): No    Suicide: low (1) to moderate (2) - feeling increasingly hopeless  Violence: low (1)  Addiction: low (1)        Protective Factors: help seeking.     DIAGNOSES ASSESSED TODAY (psychiatric diagnoses and medical diagnoses that factor into management of psychiatric treatment): DID unspecified ,  PTSD,    CLINICAL FORMULATION (Make changes as your understanding changes. 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 Pt represents for [facility] services to continue this work and has several strengths including intelligence, sense of humor and persistence.     REVIEWING TODAY'S VISIT  CLINICAL INTERVENTIONS TODAY: Continue to estable therapeutic alliance, increasing emotional literacy, exploring interpersonal styles and boundary setting       PATIENT'S RESPONSE TO INTERVENTIONS: cooperative     PROGRESS TOWARDS GOALS: n/a     TIME SPENT IN PSYCHOTHERAPY: 45 min     PLAN  PLAN FOR MANAGING RISK (Consider risk plan for patients at moderate or high risk for suicide/violence/addiction; medication plan; referrals, etc. Must coincide with treatment plan.): N/A    PLAN FOR ONGOING TREATMENT:  Continue with OP therapy     INFORMED CONSENT (for any new treatment): N/A      ONLY FOR PRESCRIBERS DOING EVALUATION AND MANAGEMENT VISITS     Use only when no psychotherapy performed  COUNSELING AND COORDINATION OF CARE PROVIDED (Consider diagnostic results/impressions and/or recommended studies; risks and benefits of treatment options; instruction for management/treatment and/or follow-up; importance of compliance with chosen treatment options; risk factor reduction; patient/family/caregiver education; prognosis): N/A      Over 50% of time during today's visit was devoted to counseling and/or coordination of care.  If yes, record estimated duration of the face to face encounter.  N/A    INSTRUCTIONS TO COVERING CLINICIANS:  N/A

## 2017-10-14 ENCOUNTER — Telehealth (HOSPITAL_BASED_OUTPATIENT_CLINIC_OR_DEPARTMENT_OTHER): Payer: Self-pay | Admitting: Emergency Medicine

## 2017-10-14 NOTE — Progress Notes (Signed)
received msg from pt. Called back but pt was unavailable, left vmail msg.

## 2017-10-19 ENCOUNTER — Ambulatory Visit (HOSPITAL_BASED_OUTPATIENT_CLINIC_OR_DEPARTMENT_OTHER): Payer: MEDICARE | Admitting: Emergency Medicine

## 2017-10-19 DIAGNOSIS — F449 Dissociative and conversion disorder, unspecified: Principal | ICD-10-CM

## 2017-10-20 NOTE — Progress Notes (Signed)
OUTPATIENT PSYCHIATRY PROGRESS NOTE    INTERPRETER: No    CONTACT INFO FOR OTHER AGENCIES AND MENTAL HEALTH PROVIDERS (IF APPLICABLE): N/a     PROBLEM(S) ADDRESSED IN THIS SESSION: attachment relationships     SUBJECTIVE  TODAY'S CHIEF COMPLAINT AND CLINICAL UPDATES IN PATIENT'S WORDS:  1) Chief Complaint (Patient and/or guardian's own words, concerns and expressed thoughts): "I didn't get good reception in the past when I said these things"    2) New information from patient and/or collateral (Patient's illness: context, course, modifying factors, severity, cultural, family, social, medical history): N/A    OBJECTIVE  DATA REVIEWED (Consider medical labs, radiology, other medical tests; screening/outcome measures; psychological testing; discussion of test results with other clinicians; consultation with other clinicians and systems involved with patient, summary of old records): n/a     CURRENT MEDICATIONS (make clear medications prescribed by psychiatry; include OTC medications):    Current Outpatient Medications:  propranolol (INDERAL) 10 MG tablet Take 1 tablet by mouth 3 (three) times daily as needed For rapid heart rate or anxiety symptoms. Disp: 90 tablet Rfl: 3   fluticasone (FLONASE) 50 MCG/ACT nasal spray 1 spray by Each Nostril route daily for allergies Disp: 1 Bottle Rfl: 12   THEANINE PO Take  by mouth. Disp:  Rfl:    Multiple Vitamin (MULTIVITAMINS PO)  Disp:  Rfl:      No current facility-administered medications for this visit.     MEDICATION ADHERENCE (including barriers and how addressed):N/A    MEDICATION SIDE EFFECTS (Prescribers Only): N/A    BIRTH CONTROL (ask females and males): N/A    CURRENT PREGNANCY: N/A                                    MENTAL STATUS EXAMINATION                     General Appearance: Within normal limits     Interaction with Interviewer (eye contact, attitude, behavior): often averting gaze,often closing eyes    Physical Signs    Gait and Station (how patient walks and  stands): Within normal limits                  Physical Appearance: Within normal limits          Normal Movements: Within normal limits         Speech (rate, volume, articulation): variable speed, low volume          Language: Within normal limits                       Mood: depressed            Affect: labile            Thought Process     Rate; concrete vs abstract reasoning: Within normal limits        Logical vs illogical; associations: loose, tangential, circumstantial, intact: Within normal limits                                    Thought Content    Normal Thought Content (other than safety): Within normal limits     Perceptions (auditory, visual, tactile, etc.): Within normal limits       Impulse Control: fair  Cognition (Link to [facility])    Orientation (person, place, time): Within normal limits      Recent and remote memory: Within normal limits           Attention span and concentration: Within normal limits         Fund of knowledge, awareness of current events and vocabulary: Within normal limits      Judgment: Within normal limits          Insight: fair        Suicidality/Homicidality/Aggression (Victimization or Perpetration): Within normal limits     ASSESSMENT   Today's Assessment:  Explored impact of previous negative experiences in therapy and pt's experience of asking what she wants. Practiced naming emotions, also explored feeling that she is much more concrete in her thought process which evokes shame and is in contrast with how articulate she is. Helped client also practice social skills and interpersonal efficacy   Risk Level Assessment  Risk Level Change (if yes, please describe): No    Suicide: low (1) to moderate (2) - feeling increasingly hopeless  Violence: low (1)  Addiction: low (1)        Protective Factors: help seeking.     DIAGNOSES ASSESSED TODAY (psychiatric diagnoses and medical diagnoses that factor into management of psychiatric treatment): DID unspecified ,   PTSD,    CLINICAL FORMULATION (Make changes as your understanding changes. 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 Pt represents for [facility] services to continue this work and has several strengths including intelligence, sense of humor and persistence.     REVIEWING TODAY'S VISIT  CLINICAL INTERVENTIONS TODAY: Continue to estable therapeutic alliance, increasing emotional literacy, exploring interpersonal styles and boundary setting       PATIENT'S RESPONSE TO INTERVENTIONS: cooperative     PROGRESS TOWARDS GOALS: n/a     TIME SPENT IN PSYCHOTHERAPY: 45 min     PLAN  PLAN FOR MANAGING RISK (Consider risk plan for patients at moderate or high risk for suicide/violence/addiction; medication plan; referrals, etc. Must coincide with treatment plan.): N/A    PLAN FOR ONGOING TREATMENT:  Continue with OP therapy     INFORMED CONSENT (for any new treatment): N/A      ONLY FOR PRESCRIBERS DOING EVALUATION AND MANAGEMENT VISITS     Use only when no psychotherapy performed  COUNSELING AND COORDINATION OF CARE PROVIDED (Consider diagnostic results/impressions and/or recommended studies; risks and benefits of treatment options; instruction for management/treatment and/or follow-up; importance of compliance with chosen treatment options; risk factor reduction; patient/family/caregiver education; prognosis): N/A      Over 50% of time during today's visit was devoted to counseling and/or coordination of care.  If yes, record estimated duration of the face to face  encounter.  N/A    INSTRUCTIONS TO COVERING CLINICIANS:  N/A

## 2017-10-21 ENCOUNTER — Ambulatory Visit (HOSPITAL_BASED_OUTPATIENT_CLINIC_OR_DEPARTMENT_OTHER): Payer: Self-pay

## 2017-10-21 NOTE — Dental Note (Signed)
Patient presented for a final impression for crown #19.    Patient pain level at beginning of appointment: 0    Interpreter: None    *Medical/Brianna [name] History*  Medical history reviewed and updated.    *Final Impression*  Local anesthetic used: 4% Articaine (1:100,000 epinephrine) and 2% Lidocaine  (1:100,000 epinephrine)  Whole number of carpules used: 2  Type of injection: Mandibular infiltration and Inferior alveloar nerve block  Packed cord: Yes  Impression material: Aquasil  Tooth shade selected: incisal 1/3 - C-3 and body 2/3 - A-3.5  Lab: [facility]    *Temporization*  Re-cemented provisional crown with TempBond. Checked and adjusted occlusion   and   contacts.    Post-op instructions given. Patient will return for crown delivery.    Patient pain level at end of appointment: 0    Patient left ambulatory, satisfied with treatment. Any remaining planned  treatments were explained in detail to the patient.    Assistant: Noralee Space    Next visit: What: del crown  Reason:    ----- Signed on Wednesday, October 21, 2017 at 5:38:50 PM  -----  ----- Provider: Barry Dienes, DMD -- Clinic: [facility] -----  ----- Override Provider: Roney Jaffe, DDS -----

## 2017-10-22 ENCOUNTER — Ambulatory Visit (HOSPITAL_BASED_OUTPATIENT_CLINIC_OR_DEPARTMENT_OTHER): Payer: MEDICARE | Admitting: Family Medicine

## 2017-10-26 ENCOUNTER — Ambulatory Visit (HOSPITAL_BASED_OUTPATIENT_CLINIC_OR_DEPARTMENT_OTHER): Payer: MEDICARE | Admitting: Emergency Medicine

## 2017-10-26 DIAGNOSIS — F449 Dissociative and conversion disorder, unspecified: Secondary | ICD-10-CM

## 2017-10-26 NOTE — Progress Notes (Signed)
OUTPATIENT PSYCHIATRY PROGRESS NOTE    INTERPRETER: No    CONTACT INFO FOR OTHER AGENCIES AND MENTAL HEALTH PROVIDERS (IF APPLICABLE): N/a     PROBLEM(S) ADDRESSED IN THIS SESSION: attachment relationships     SUBJECTIVE  TODAY'S CHIEF COMPLAINT AND CLINICAL UPDATES IN PATIENT'S WORDS:  1) Chief Complaint (Patient and/or guardian's own words, concerns and expressed thoughts): "I need you to say you understand my pain."    2) New information from patient and/or collateral (Patient's illness: context, course, modifying factors, severity, cultural, family, social, medical history): N/A    OBJECTIVE  DATA REVIEWED (Consider medical labs, radiology, other medical tests; screening/outcome measures; psychological testing; discussion of test results with other clinicians; consultation with other clinicians and systems involved with patient, summary of old records): n/a     CURRENT MEDICATIONS (make clear medications prescribed by psychiatry; include OTC medications):    Current Outpatient Medications:  propranolol (INDERAL) 10 MG tablet Take 1 tablet by mouth 3 (three) times daily as needed For rapid heart rate or anxiety symptoms. Disp: 90 tablet Rfl: 3   fluticasone (FLONASE) 50 MCG/ACT nasal spray 1 spray by Each Nostril route daily for allergies Disp: 1 Bottle Rfl: 12   THEANINE PO Take  by mouth. Disp:  Rfl:    Multiple Vitamin (MULTIVITAMINS PO)  Disp:  Rfl:      No current facility-administered medications for this visit.     MEDICATION ADHERENCE (including barriers and how addressed):N/A    MEDICATION SIDE EFFECTS (Prescribers Only): N/A    BIRTH CONTROL (ask females and males): N/A    CURRENT PREGNANCY: N/A                                    MENTAL STATUS EXAMINATION                     General Appearance: Within normal limits     Interaction with Interviewer (eye contact, attitude, behavior): often averting gaze,often closing eyes    Physical Signs    Gait and Station (how patient walks and stands): Within normal  limits                  Physical Appearance: Within normal limits          Normal Movements: Within normal limits         Speech (rate, volume, articulation): variable speed, low volume          Language: Within normal limits                       Mood: depressed            Affect: labile            Thought Process     Rate; concrete vs abstract reasoning: Within normal limits        Logical vs illogical; associations: loose, tangential, circumstantial, intact: Within normal limits                                    Thought Content    Normal Thought Content (other than safety): Within normal limits     Perceptions (auditory, visual, tactile, etc.): Within normal limits       Impulse Control: fair         Cognition (Link  to [facility])    Orientation (person, place, time): Within normal limits      Recent and remote memory: Within normal limits           Attention span and concentration: Within normal limits         Fund of knowledge, awareness of current events and vocabulary: Within normal limits      Judgment: Within normal limits          Insight: fair        Suicidality/Homicidality/Aggression (Victimization or Perpetration): Within normal limits     ASSESSMENT   Today's Assessment:  Explored pt's felt ruptures in this therapeutic relationship. Notes how she manages anger differentlyl. pt stating what she feels is helpful and some frustration at why she can't get in therapy.     Risk Level Assessment  Risk Level Change (if yes, please describe): No    Suicide: low (1) to moderate (2) - feeling increasingly hopeless  Violence: low (1)  Addiction: low (1)        Protective Factors: help seeking.     DIAGNOSES ASSESSED TODAY (psychiatric diagnoses and medical diagnoses that factor into management of psychiatric treatment): DID unspecified ,  PTSD,    CLINICAL FORMULATION (Make changes as your understanding changes. Should coincide with treatment plan): Brianna [name] is an intelligent 62 yo single (never married), domiciled,  woman on disability, who has a hx of DID and PTSD, who has a hx of early childhood trauma and disrupted attachments which has manifested in long hx of relational difficulties across several life domains and a hx of affective and behavioral dsyregulation but more recently has been able to develop some skills to more effectively self-regulate and manage social interactions more successfully in therapy. Pt represents for [facility] services to continue this work and has several strengths including intelligence, sense of humor and persistence.     REVIEWING TODAY'S VISIT  CLINICAL INTERVENTIONS TODAY: Continue to estable therapeutic alliance, increasing emotional literacy, exploring interpersonal styles and boundary setting       PATIENT'S RESPONSE TO INTERVENTIONS: cooperative     PROGRESS TOWARDS GOALS: n/a     TIME SPENT IN PSYCHOTHERAPY: 45 min     PLAN  PLAN FOR MANAGING RISK (Consider risk plan for patients at moderate or high risk for suicide/violence/addiction; medication plan; referrals, etc. Must coincide with treatment plan.): N/A    PLAN FOR ONGOING TREATMENT:  Continue with OP therapy     INFORMED CONSENT (for any new treatment): N/A      ONLY FOR PRESCRIBERS DOING EVALUATION AND MANAGEMENT VISITS     Use only when no psychotherapy performed  COUNSELING AND COORDINATION OF CARE PROVIDED (Consider diagnostic results/impressions and/or recommended studies; risks and benefits of treatment options; instruction for management/treatment and/or follow-up; importance of compliance with chosen treatment options; risk factor reduction; patient/family/caregiver education; prognosis): N/A      Over 50% of time during today's visit was devoted to counseling and/or coordination of care.  If yes, record estimated duration of the face to face encounter.  N/A    INSTRUCTIONS TO COVERING CLINICIANS:  N/A

## 2017-10-29 ENCOUNTER — Ambulatory Visit (HOSPITAL_BASED_OUTPATIENT_CLINIC_OR_DEPARTMENT_OTHER): Payer: MEDICARE | Admitting: Family Medicine

## 2017-10-29 VITALS — BP 110/74 | HR 72 | Wt 111.4 lb

## 2017-10-29 DIAGNOSIS — R232 Flushing: Secondary | ICD-10-CM

## 2017-10-29 DIAGNOSIS — R432 Parageusia: Secondary | ICD-10-CM

## 2017-10-29 DIAGNOSIS — F449 Dissociative and conversion disorder, unspecified: Secondary | ICD-10-CM

## 2017-10-29 DIAGNOSIS — G4700 Insomnia, unspecified: Secondary | ICD-10-CM

## 2017-10-29 NOTE — Progress Notes (Signed)
SUBJECTIVE: Brianna [name] is a 62 year old female with the following Problems and Medications.    Patient Active Problem List:     Post-traumatic stress disorder, chronic     FATIGUE GENERAL     Family history of malignant neoplasm of breast     Screening for unspecified condition     Dissociative disorder or reaction, unspecified     Major depressive disorder, recurrent episode, moderate ([place])     Bunion     Vitamin D deficiency     High myopia, both eyes     Pseudophakia     History of osteopenia     Urge incontinence     Other microscopic hematuria     External hemorrhoids     Not immune to hepatitis B virus     Elevated hemoglobin A1c     Hot flashes      Current Outpatient Medications:  propranolol (INDERAL) 10 MG tablet Take 1 tablet by mouth 3 (three) times daily as needed For rapid heart rate or anxiety symptoms. Disp: 90 tablet Rfl: 3   fluticasone (FLONASE) 50 MCG/ACT nasal spray 1 spray by Each Nostril route daily for allergies Disp: 1 Bottle Rfl: 12   Multiple Vitamin (MULTIVITAMINS PO)  Disp:  Rfl:    co-enzyme Q-10 30 MG capsule Take 1 capsule by mouth 3 (three) times daily Disp:  Rfl:    Omega-3 Fatty Acids (FISH OIL) 1000 MG CAPS Take by mouth Disp:  Rfl:    THEANINE PO Take  by mouth. Disp:  Rfl:      No current facility-administered medications for this visit.   Review of Patient's Allergies indicates:   Penicillins             Hives    Comment:childhood.     Here for ongoing follow up.  Would like to speak about possible treatments in the future for several of her concerns.      Has had return of hot flashes - after a lifetime of feeling cold.  ging to acupuncture and hoping it willb e helpful but would like a plan b.  Wonders about medications for hot flashes.    oroginally did have hot flashes with menopause, then they were gone for about 5 years, in the last year they have come back.  Not sure if they're the same. Suddenly feels really hot, has to remove a layer.  Symptoms last just  minutes.  Can be provoked by emotional stress.      Feels like sense of taste may be a little better.  Has been using Flonase every day and associates the improvement with this practice but wonders why the Flonase is helping her sense of taste    Only tried melatonin for sleep, not helpful and she is very troubled by poor sleep quality..  Hard to get to sleep and also hard to stay asleep.  Every night "pretty much" , started last fall during a trip.        Tries to wait until tired to go to sleep, looks at news clips on internet before bed.  Does not read TV or phone or computer in bedroom, has dark room, no caffeine use to speak of.  Tries to sleep between 10 and 12, feels like it's hard to get off to sleep, then wakes up, out of bed 830.  Does not have a clock in her bedroom so does not know really how long it takes her to go to  sleep nor when she wakes up.  In the morning she does not feel refreshed and suspects that her mental fog is related to poor sleep quality.  Really no sure how much she's sleeping.      Finally continues to be frustrated by verbal outbursts which she associated with her PTSD and dissociation.  She describes it as "like Tourette's" and wonders if there are things other than therapy that may be helpful for her to control.    OBJECTIVE: She appears well, in no apparent distress.  Alert and oriented times three, pleasant and cooperative. Vital signs are as noted by the nurse. BP 110/74  Pulse 72  Wt 50.5 kg (111 lb 6.4 oz)  LMP  (LMP Unknown)  SpO2 97%  BMI 19.99 kg/m2  Mental status exam; she is alert, orient to time, person and place. Normal thought content, speech, affect, mood and dress are noted.  She appears somewhat anxious however is well engaged in slight fall and with very goal directed conversation    (R23.2) Hot flashes  (primary encounter diagnosis)  Comment: As she is now several years after her perimenopausal hot flashes suspect that these are more related to emotional  dysregulation than hormonal.  They are very quick however quite disruptive.  Agree with attempting traditional [place] medicine and acupuncture to see if this is helpful first.  Reviewed other options might be SSRIs or clonidine.  SSRIs could also be helpful for her sleep and clonidine could potentially be helpful for PTSD or verbal tics  Plan: She will follow-up in 1 month or so and let me know how things are going    (F44.9) Dissociative disorder or reaction, unspecified  Comment: Continues in therapy which she is not sure if it is helpful however she has been successful in maintaining a therapeutic relationship so far  Plan: co-enzyme Q-10 30 MG capsule, Omega-3 Fatty         Acids (FISH OIL) 1000 MG CAPS        No change in treatment or medications for now strongly encouraged her to keep up with her psychotherapy    (R43.2) Taste impairment  Comment: Reviewed the location of the cribriform plate and how molecules for taste and odor sensing have to reach here, Flonase can help enlarged restricted passage ways to improve sense of smell and taste  Plan: Continue Flonase for now, she has scheduled appointment for ENT next month if she continues to feel that her symptoms are not well controlled    (G47.00) Insomnia, unspecified type  Comment: Very difficult to quantify or even qualify her sleep disorder.  Sounds like she has trouble getting to sleep and staying asleep however we do not have a quantity on that.  She is not of a typical physiognomy for snoring although once in a while she said she wakes herself up with a loud noise.  Reviewed medication options.  In her age group there are not many that are considered safe could consider SSRI or possibly doxepin in the future  Plan: Reviewed sleep hygiene, encouraged her to turn off electronics 1-2 hours before sleep and she will follow-up in 1 month       .

## 2017-11-02 ENCOUNTER — Ambulatory Visit (HOSPITAL_BASED_OUTPATIENT_CLINIC_OR_DEPARTMENT_OTHER): Payer: Self-pay

## 2017-11-02 NOTE — Dental Note (Signed)
Next visit: What: del crown  Reason:  -----  Provider: Roney Jaffe, DDS -----    Medical Alert: Allergies    Mental Disorders    Other    Vitamin Deficiency    Depression  Medications: Other    Propranolol    Clindamycin 300 mg  Allergies:      Penicillin  Since Last Visit: Medical Alert: No Change    Medications: No Change    Allergies:        No Change  Pain Scale Type: Numeric Pain Scale Pain Level: 0  Description:    Patient presented for a hygiene visit.    Interpreter: None    Patient pain level at beginning of appointment: 0    *Medical/Marquan Vokes History*  Medical history: No change    *Radiographs*  Radiographs taken: FMX  Radiographs taken to evaluate for: Eval for caries and/or pathology    *Hygiene Observations*  - Overall Oral Hygiene:Poor  - Plaque: Heavy  - Staining: Medium and Heavy  - Calculus: Medium  - Food Impaction: Medium  - Bleeding: Light  - Gingival Attachment: Recession  - Periodontal description: Generalized fibrotic tissue  - Periodontal probing: N/A  - Perio classification: II-Chronic Periodontitis, generalized, moderate  severity  -Home Care: Poor  -Intraoral exam: Dry mouth    *Treatment*  Services provided: Prophy and Oral hygiene Instruction.  Prophylaxis today consisted of hand scale, prophy cup polish, Cavitron and  flossing. Proper technique of brushing demonstrated.  Flossing with fingers   and   a floss holder demonstrated.  Patient given a soft toothbrush and floss.    *Assessment & Plan*  Patient was not examined by dentist.  Assessment/Plan: NA    Patient pain level at end of appointment: 0    Patient left ambulatory, satisfied with treatment. Any remaining planned  treatments were explained in detail to the patient.    Hygiene Recare 4 month interval    Next Visit: see above  *Instructions for Receptionist*  Procedure:  Provider:  When:    ----- Signed on Monday, November 02, 2017 at 10:48:50 AM  -----  ----- Provider: Jamie Brookes - Quillian Quince, RDH -- Clinic:  [facility] -----

## 2017-11-09 ENCOUNTER — Ambulatory Visit (HOSPITAL_BASED_OUTPATIENT_CLINIC_OR_DEPARTMENT_OTHER): Payer: MEDICARE | Admitting: Emergency Medicine

## 2017-11-09 DIAGNOSIS — F449 Dissociative and conversion disorder, unspecified: Principal | ICD-10-CM

## 2017-11-11 ENCOUNTER — Ambulatory Visit (HOSPITAL_BASED_OUTPATIENT_CLINIC_OR_DEPARTMENT_OTHER): Payer: MEDICARE | Admitting: Psychiatry

## 2017-11-11 DIAGNOSIS — F4310 Post-traumatic stress disorder, unspecified: Secondary | ICD-10-CM
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
## 2017-11-11 NOTE — Progress Notes (Signed)
OUTPATIENT PSYCHIATRY PROGRESS NOTE    INTERPRETER: No    CONTACT INFO FOR OTHER AGENCIES AND MENTAL HEALTH PROVIDERS (IF APPLICABLE): N/a     PROBLEM(S) ADDRESSED IN THIS SESSION: attachment relationships, emotional literacy      SUBJECTIVE  TODAY'S CHIEF COMPLAINT AND CLINICAL UPDATES IN PATIENT'S WORDS:  1) Chief Complaint (Patient and/or guardian's own words, concerns and expressed thoughts): "I need to think about it"     2) New information from patient and/or collateral (Patient's illness: context, course, modifying factors, severity, cultural, family, social, medical history): N/A    OBJECTIVE  DATA REVIEWED (Consider medical labs, radiology, other medical tests; screening/outcome measures; psychological testing; discussion of test results with other clinicians; consultation with other clinicians and systems involved with patient, summary of old records): n/a     CURRENT MEDICATIONS (make clear medications prescribed by psychiatry; include OTC medications):    Current Outpatient Medications:  co-enzyme Q-10 30 MG capsule Take 1 capsule by mouth 3 (three) times daily Disp:  Rfl:    Omega-3 Fatty Acids (FISH OIL) 1000 MG CAPS Take by mouth Disp:  Rfl:    propranolol (INDERAL) 10 MG tablet Take 1 tablet by mouth 3 (three) times daily as needed For rapid heart rate or anxiety symptoms. Disp: 90 tablet Rfl: 3   THEANINE PO Take  by mouth. Disp:  Rfl:    Multiple Vitamin (MULTIVITAMINS PO)  Disp:  Rfl:      No current facility-administered medications for this visit.     MEDICATION ADHERENCE (including barriers and how addressed):N/A    MEDICATION SIDE EFFECTS (Prescribers Only): N/A    BIRTH CONTROL (ask females and males): N/A    CURRENT PREGNANCY: N/A                                    MENTAL STATUS EXAMINATION                     General Appearance: Within normal limits     Interaction with Interviewer (eye contact, attitude, behavior): often averting gaze,often closing eyes    Physical Signs    Gait and  Station (how patient walks and stands): Within normal limits                  Physical Appearance: Within normal limits          Normal Movements: Within normal limits         Speech (rate, volume, articulation): variable speed, low volume          Language: Within normal limits                       Mood: depressed            Affect: labile            Thought Process     Rate; concrete vs abstract reasoning: Within normal limits        Logical vs illogical; associations: loose, tangential, circumstantial, intact: Within normal limits                                    Thought Content    Normal Thought Content (other than safety): Within normal limits     Perceptions (auditory, visual, tactile, etc.): Within normal limits  Impulse Control: fair         Cognition (Link to [facility])    Orientation (person, place, time): Within normal limits      Recent and remote memory: Within normal limits           Attention span and concentration: Within normal limits         Fund of knowledge, awareness of current events and vocabulary: Within normal limits      Judgment: Within normal limits          Insight: fair        Suicidality/Homicidality/Aggression (Victimization or Perpetration): Within normal limits     ASSESSMENT   Today's Assessment:  Explored further what pt feels she needs in therapy; 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 Also explored whether pt felt she needed a higher level of care but pt does not feel this is beneficial.Discussed ways of treating alexithymia   Risk Level Assessment  Risk Level Change (if yes, please describe): No    Suicide: low (1) to moderate (2) - feeling increasingly hopeless  Violence: low (1)  Addiction: low (1)        Protective Factors: help seeking.     DIAGNOSES ASSESSED TODAY (psychiatric diagnoses and medical diagnoses that factor into management of psychiatric treatment): DID unspecified ,  PTSD,    CLINICAL FORMULATION (Make changes as your  understanding changes. 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 Pt represents for [facility] services to continue this work and has several strengths including intelligence, sense of humor and persistence.     REVIEWING TODAY'S VISIT  CLINICAL INTERVENTIONS TODAY: Continue to estable therapeutic alliance, increasing emotional literacy, exploring interpersonal styles and boundary setting       PATIENT'S RESPONSE TO INTERVENTIONS: cooperative     PROGRESS TOWARDS GOALS: n/a     TIME SPENT IN PSYCHOTHERAPY: 45 min     PLAN  PLAN FOR MANAGING RISK (Consider risk plan for patients at moderate or high risk for suicide/violence/addiction; medication plan; referrals, etc. Must coincide with treatment plan.): N/A    PLAN FOR ONGOING TREATMENT:  Continue with OP therapy     INFORMED CONSENT (for any new treatment): N/A      ONLY FOR PRESCRIBERS DOING EVALUATION AND MANAGEMENT VISITS     Use only when no psychotherapy performed  COUNSELING AND COORDINATION OF CARE PROVIDED (Consider diagnostic results/impressions and/or recommended studies; risks and benefits of treatment options; instruction for management/treatment and/or follow-up; importance of compliance with chosen treatment options; risk factor reduction; patient/family/caregiver education; prognosis): N/A      Over 50% of time during today's visit was devoted to counseling and/or coordination of care.  If yes, record estimated duration of the face to face encounter.  N/A    INSTRUCTIONS TO COVERING  CLINICIANS:  N/A

## 2017-11-12 ENCOUNTER — Encounter (HOSPITAL_BASED_OUTPATIENT_CLINIC_OR_DEPARTMENT_OTHER): Payer: Self-pay | Admitting: Dentistry
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
## 2017-11-16 ENCOUNTER — Ambulatory Visit (HOSPITAL_BASED_OUTPATIENT_CLINIC_OR_DEPARTMENT_OTHER): Payer: MEDICARE | Admitting: Emergency Medicine

## 2017-11-25 ENCOUNTER — Telehealth (HOSPITAL_BASED_OUTPATIENT_CLINIC_OR_DEPARTMENT_OTHER): Payer: Self-pay | Admitting: Family Medicine

## 2017-11-25 ENCOUNTER — Encounter (HOSPITAL_BASED_OUTPATIENT_CLINIC_OR_DEPARTMENT_OTHER): Payer: Self-pay | Admitting: Family Medicine

## 2017-11-25 ENCOUNTER — Ambulatory Visit (HOSPITAL_BASED_OUTPATIENT_CLINIC_OR_DEPARTMENT_OTHER): Payer: MEDICARE | Admitting: Psychiatry

## 2017-11-25 DIAGNOSIS — F331 Major depressive disorder, recurrent, moderate: Secondary | ICD-10-CM

## 2017-11-25 DIAGNOSIS — F4310 Post-traumatic stress disorder, unspecified: Secondary | ICD-10-CM

## 2017-11-25 MED ORDER — FLUOXETINE HCL 10 MG PO TABS
5.0000 mg | ORAL_TABLET | Freq: Every day | ORAL | 0 refills | Status: DC
Start: 2017-11-25 — End: 2017-12-03

## 2017-11-25 MED ORDER — FLUOXETINE HCL 10 MG PO TABS: 5 mg | tablet | Freq: Every day | ORAL | 0 refills | 0 days | Status: DC

## 2017-11-25 NOTE — Progress Notes (Signed)
OUTPATIENT PSYCHOPHARMACOLOGY PROGRESS NOTE     CC:    Wants to go to [employer] at [employer]asked her to ask her therapist to fill it out    HPI:   I think I am doing better--the better weather has helped--has been getting gout a little more  Think I have to move to a sunnier area     EXAMINATION:   General Appearance/Behavior:  Wide-eyed, gray-haired, serious lookin, colorless,  thin woman with long face, no make-up  Gait and Station/Motor: wnl  Attention/Concentration: ok  Speech/Language:wnl  Mood: "mood better--moments of feeling happy",patient has alexathymia and has very hard time labeling her own moods  Affect:  Flat, restricted  Thought Process/Associations: linear  Thought Content/Perceptions: no delusions or hallucinations, ongoing thme of not being understood  Judgment/Insight: limited  Orientation:fully  Memory: ok  Fund of knowledge:  ok  Suicidal/Homicidal:  denies both     DATA REVIEWED   Pregnancy/Birth Control:  N/a  MASSPAT: no problem     Current medications (by psychiatry in [place]):   co-enzyme Q-10 30 MG capsule Take 1 capsule by mouth 3 (three) times daily Disp:  Rfl:    Omega-3 Fatty Acids (FISH OIL) 1000 MG CAPS Take by mouth Disp:  Rfl:    propranolol (INDERAL) 10 MG tablet Take 1 tablet by mouth 3 (three) times daily as needed For rapid heart rate or anxiety symptoms. Disp: 90 tablet Rfl: 3   THEANINE PO Take  by mouth. Disp:  Rfl:    Multiple Vitamin (MULTIVITAMINS PO)  Disp:  Rfl:      ASSESSMENT:  DIAGNOSES:  DSM 5 DIAGNOSES  Primary Psychiatric Dx:  Complex PTSD  Secondary Psychiatric Dx:  Borderline traits  Other Medical  Conditions:  Non-contrib  Psychosocial and Contextual Factors: social isolation    Overall Clinical formulation (from initial evaluation- amended as indicated):  This patient is a puzzle.  Her hx does not quite line up with her degree of PTSD sy's.  Psychological testing might be helpful.  She is definitely ambivalent about medication and may decide  not to use any, although I "d make a pitch for prazosin or clonidine and SSRI's  Gave her printed material and shared that helping her sleep without bad dreams would help her.    Current Assessment:  Essentially not change since 11/11/17.  Denies symptomatic complaints but worries about getting more depressed again next fall/winter and willing to try SSRI preventatively.  We also discussed use of light box in fall     INTERVENTIONS DURING VISIT:  Counseling and Coordination of Care provided I have spent 35 minutes in face to face time with this patient/patient proxy of which > 50% was in counseling or coordination of care regarding above issues/Dx.  --patient agreed to try low-dose prozac    PLAN:  Monitor response to prozac  Encourage use of prazosin or clonidine at future visits.    Notes for covering providers: May refill    RTC: .2 wks

## 2017-11-25 NOTE — Progress Notes (Signed)
Brianna [name],  [id number],  female  Date of Birth: 1956/05/21, 62 year old  Telephone Information:   Home Phone [phone]   Work Phone [phone]   Mobile [phone]   Patient's PCP:   Joanne Chars MD  Patient's language of care:   English  Caller does not need an interpreter.    Pt/Parent walks in today to drop off form/letter for: [facility] medical info       Did patient fill in information required of them on form, and sign where applicable?  Yes    Signed consent form on file or attached:  Yes      If not, have patient fill out [place] release form.    Other: [facility] medical info     CHECK form to see if PPD is required:   No   Date of Last Physical: 07/07/17     Date of Last visit: 10/29/2017                                  Does patient need to be seen before provider can complete form?  No     If yes, make appointment with patient.   Disability forms may require that pt has been seen within 30 days of completing form. Appt scheduled for: already scheduled for 12/03/2017    Date patient needs completed by: 12/02/2017  Patient's contact telephone number: [phone]      Will pick-up at the front desk when notified form is ready:  No     Fax to:  [phone] (requires signed release if other than patient).  Attn to: Nani Ravens  Will pick-up at the front desk after form has been faxed:  No    Form in [facility] box.    SEND TO [facility]

## 2017-11-26 NOTE — Progress Notes (Signed)
Form placed in MD box for completion.  (Located in Team office)

## 2017-11-30 ENCOUNTER — Ambulatory Visit (HOSPITAL_BASED_OUTPATIENT_CLINIC_OR_DEPARTMENT_OTHER): Payer: MEDICARE | Admitting: Emergency Medicine

## 2017-11-30 DIAGNOSIS — F449 Dissociative and conversion disorder, unspecified: Principal | ICD-10-CM

## 2017-11-30 NOTE — Progress Notes (Signed)
OUTPATIENT PSYCHIATRY PROGRESS NOTE    INTERPRETER: No    CONTACT INFO FOR OTHER AGENCIES AND MENTAL HEALTH PROVIDERS (IF APPLICABLE): N/a     PROBLEM(S) ADDRESSED IN THIS SESSION: attachment relationships, emotional literacy      SUBJECTIVE  TODAY'S CHIEF COMPLAINT AND CLINICAL UPDATES IN PATIENT'S WORDS:  1) Chief Complaint (Patient and/or guardian's own words, concerns and expressed thoughts): "I need to think about it"     2) New information from patient and/or collateral (Patient's illness: context, course, modifying factors, severity, cultural, family, social, medical history): N/A    OBJECTIVE  DATA REVIEWED (Consider medical labs, radiology, other medical tests; screening/outcome measures; psychological testing; discussion of test results with other clinicians; consultation with other clinicians and systems involved with patient, summary of old records): n/a     CURRENT MEDICATIONS (make clear medications prescribed by psychiatry; include OTC medications):    Current Outpatient Medications:  FLUoxetine (PROZAC) 10 MG tablet Take 0.5 tablets by mouth daily Disp: 15 tablet Rfl: 0   co-enzyme Q-10 30 MG capsule Take 1 capsule by mouth 3 (three) times daily Disp:  Rfl:    Omega-3 Fatty Acids (FISH OIL) 1000 MG CAPS Take by mouth Disp:  Rfl:    propranolol (INDERAL) 10 MG tablet Take 1 tablet by mouth 3 (three) times daily as needed For rapid heart rate or anxiety symptoms. Disp: 90 tablet Rfl: 3   THEANINE PO Take  by mouth. Disp:  Rfl:    Multiple Vitamin (MULTIVITAMINS PO)  Disp:  Rfl:      No current facility-administered medications for this visit.     MEDICATION ADHERENCE (including barriers and how addressed):N/A    MEDICATION SIDE EFFECTS (Prescribers Only): N/A    BIRTH CONTROL (ask females and males): N/A    CURRENT PREGNANCY: N/A                                    MENTAL STATUS EXAMINATION                     General Appearance: Within normal limits     Interaction with Interviewer (eye contact,  attitude, behavior): often averting gaze,often closing eyes    Physical Signs    Gait and Station (how patient walks and stands): Within normal limits                  Physical Appearance: Within normal limits          Normal Movements: Within normal limits         Speech (rate, volume, articulation): variable speed, low volume          Language: Within normal limits                       Mood: depressed            Affect: labile            Thought Process     Rate; concrete vs abstract reasoning: Within normal limits        Logical vs illogical; associations: loose, tangential, circumstantial, intact: Within normal limits                                    Thought Content    Normal Thought Content (other than safety):  Within normal limits     Perceptions (auditory, visual, tactile, etc.): Within normal limits       Impulse Control: fair         Cognition (Link to [facility])    Orientation (person, place, time): Within normal limits      Recent and remote memory: Within normal limits           Attention span and concentration: Within normal limits         Fund of knowledge, awareness of current events and vocabulary: Within normal limits      Judgment: Within normal limits          Insight: fair        Suicidality/Homicidality/Aggression (Victimization or Perpetration): Within normal limits     ASSESSMENT   Today's Assessment:  Explored further what pt feels she needs in therapy; explored pt's frustration (which was appropriately expressed) with this Probation officer and validated pt's difficult position of not knowing if she should switched providers. Explored ways in which countertransference arises in session. Pt able to tolerate this but has difficulty acknowledging her own strengths.   Risk Level Assessment  Risk Level Change (if yes, please describe): No    Suicide: low (1) to moderate (2) - feeling increasingly hopeless  Violence: low (1)  Addiction: low (1)        Protective Factors: help seeking.     DIAGNOSES ASSESSED  TODAY (psychiatric diagnoses and medical diagnoses that factor into management of psychiatric treatment): DID unspecified ,  PTSD,    CLINICAL FORMULATION (Make changes as your understanding changes. 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 Pt represents for [facility] services to continue this work and has several strengths including intelligence, sense of humor and persistence.     REVIEWING TODAY'S VISIT  CLINICAL INTERVENTIONS TODAY: Continue to estable therapeutic alliance, increasing emotional literacy, exploring interpersonal styles and boundary setting       PATIENT'S RESPONSE TO INTERVENTIONS: cooperative     PROGRESS TOWARDS GOALS: n/a     TIME SPENT IN PSYCHOTHERAPY: 45 min     PLAN  PLAN FOR MANAGING RISK (Consider risk plan for patients at moderate or high risk for suicide/violence/addiction; medication plan; referrals, etc. Must coincide with treatment plan.): N/A    PLAN FOR ONGOING TREATMENT:  Continue with OP therapy     INFORMED CONSENT (for any new treatment): N/A      ONLY FOR PRESCRIBERS DOING EVALUATION AND MANAGEMENT VISITS     Use only when no psychotherapy performed  COUNSELING AND COORDINATION OF CARE PROVIDED (Consider diagnostic results/impressions and/or recommended studies; risks and benefits of treatment options; instruction for management/treatment and/or follow-up; importance of compliance with chosen treatment options; risk factor reduction; patient/family/caregiver education; prognosis): N/A      Over 50% of time during today's  visit was devoted to counseling and/or coordination of care.  If yes, record estimated duration of the face to face encounter.  N/A    INSTRUCTIONS TO COVERING CLINICIANS:  N/A

## 2017-11-30 NOTE — Progress Notes (Signed)
Form completed and given to [place] to fax.

## 2017-12-01 ENCOUNTER — Ambulatory Visit (HOSPITAL_BASED_OUTPATIENT_CLINIC_OR_DEPARTMENT_OTHER): Payer: MEDICARE | Admitting: Otolaryngology

## 2017-12-03 ENCOUNTER — Encounter (HOSPITAL_BASED_OUTPATIENT_CLINIC_OR_DEPARTMENT_OTHER): Payer: Self-pay | Admitting: Family Medicine

## 2017-12-03 ENCOUNTER — Ambulatory Visit (HOSPITAL_BASED_OUTPATIENT_CLINIC_OR_DEPARTMENT_OTHER): Payer: MEDICARE | Admitting: Family Medicine

## 2017-12-03 VITALS — BP 96/60 | HR 71

## 2017-12-03 DIAGNOSIS — M8580 Other specified disorders of bone density and structure, unspecified site: Secondary | ICD-10-CM

## 2017-12-03 DIAGNOSIS — F4312 Post-traumatic stress disorder, chronic: Secondary | ICD-10-CM

## 2017-12-03 DIAGNOSIS — K9041 Non-celiac gluten sensitivity: Secondary | ICD-10-CM

## 2017-12-03 DIAGNOSIS — F331 Major depressive disorder, recurrent, moderate: Secondary | ICD-10-CM

## 2017-12-03 DIAGNOSIS — R7309 Other abnormal glucose: Secondary | ICD-10-CM

## 2017-12-03 DIAGNOSIS — R3129 Other microscopic hematuria: Secondary | ICD-10-CM

## 2017-12-03 LAB — URINALYSIS
BILIRUBIN, URINE: NEGATIVE
GLUCOSE, URINE: NEGATIVE MG/DL
LEUKOCYTE ESTERASE: NEGATIVE
NITRITE, URINE: NEGATIVE
OCCULT BLOOD, URINE: NEGATIVE
PH URINE: 5.5 (ref 5.0–8.0)
PROTEIN, URINE: NEGATIVE MG/DL
SPECIFIC GRAVITY URINE: 1.022 (ref 1.003–1.035)

## 2017-12-03 NOTE — Progress Notes (Signed)
SUBJECTIVE:   Brianna [name] is a 62 year old female with the following Problems and Medications.    Patient Active Problem List:     Post-traumatic stress disorder, chronic     FATIGUE GENERAL     Family history of malignant neoplasm of breast     Screening for unspecified condition     Dissociative disorder or reaction, unspecified     Major depressive disorder, recurrent episode, moderate ([place])     Bunion     Vitamin D deficiency     High myopia, both eyes     Pseudophakia     History of osteopenia     Urge incontinence     Other microscopic hematuria     External hemorrhoids     Not immune to hepatitis B virus     Elevated hemoglobin A1c     Hot flashes    Current Outpatient Medications   Medication Sig   . FLUoxetine (PROZAC) 10 MG tablet Take 0.5 tablets by mouth daily   . co-enzyme Q-10 30 MG capsule Take 1 capsule by mouth 3 (three) times daily   . Omega-3 Fatty Acids (FISH OIL) 1000 MG CAPS Take by mouth   . propranolol (INDERAL) 10 MG tablet Take 1 tablet by mouth 3 (three) times daily as needed For rapid heart rate or anxiety symptoms.   . THEANINE PO Take  by mouth.   . Multiple Vitamin (MULTIVITAMINS PO)      No current facility-administered medications for this visit.      Review of Patient's Allergies indicates:   Penicillins             Hives    Comment:childhood. here for ongonig follow up.    Has questions about her persistent stress and abdominal fat, wonders about associated diseases or if needs to see endocrinologist.  Has noted in the last few years with more fullness at her waist even without weight gain.    Still feels hot and cold, difficult to be comfortable.  Working with zero-balancing, found acupuncture to be overwhelming.  Not sure if helpful. Saw myofascial release person today and thought it might be really helpful and plans to continue.  Never started fluoxetine, did not think it was needed.  Has not used propranolol recently either.    Not sure if psychotherapist is a good  match, but has been speaking about this with her theapist, open to finding a new therapist.    Feels sensitive to gluten and dairy - feels more postnasal drip/mucus and maybe eye tearing. w ould like nutrition referral to help plan her diet..    interestedi n getting into more exercise, wonders what she needs to do to prepare for this.  Already walks regularly, wonders about arm fitness.    Denies any urinary symptoms, never gross hematuria that she had seen.    Denies knowing about prior dx osteopenia.  Currently about 16 years out from her last period.    OBJECTIVE: She appears well, in no apparent distress.  Alert and oriented times three, pleasant and cooperative. Vital signs are as noted by the nurse. Mental status exam; she is alert, orient to time, person and place. Normal thought content, speech, affect, mood and dress are noted.     (F33.1) Major depressive disorder, recurrent episode, moderate ([place])  (primary encounter diagnosis)  Comment: encouraged her to keep up with her therapist and with self advocacy.  She does not want to go bck to psychopharm  Plan: continue to encourage adherence.    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  Suspect she may be sensitive to dairy and gluten however she does not feel able to undertake a food challenge test for now.  She also noticed some abdominal bloating after peanut butter and worried that this was an allergy.  Reassured her that none of these sound like allergies but rather food sensitivities or intolerances.  Plan: REFERRAL TO NUTRITION ( INT) NON-DIABETES        Hopefully she can create some more varied and accessible nutrition with her limitations    (M85.80) Osteopenia, unspecified location  Comment: This is noted in 2004, at this point she should have a follow-up.  She is slender with a history of vitamin D  deficiency although she is active  Plan: XR BONE DENSITOMETRY, LUMBAR/HIPS        Await results    (R31.29) Hematuria, microscopic  Comment: Has a long history of microscopic hematuria and I do not think she is ever had an evaluation.  If it is persistent today she should see urology  Plan: URINALYSIS        Await results    (R73.09) Elevated hemoglobin A1c  Comment: In the past has had a mildly elevated hemoglobin A1c, reviewed the significance of this with the patient.  She is not a typical diabetic patient and she was not very near the diabetic diagnostic level  Plan: Would continue annual hemoglobin A1c    (F43.12) Post-traumatic stress disorder, chronic  Comment: She is engaged in treatment which is an improvement for her, also be a good self advocate looking for other treatment avenues such as myofascial work.  Plan:     4 to 6-week follow-up ongoing encouragement for her self advocacy and seeking wellness.            [name]

## 2017-12-07 ENCOUNTER — Ambulatory Visit (HOSPITAL_BASED_OUTPATIENT_CLINIC_OR_DEPARTMENT_OTHER): Payer: MEDICARE | Admitting: Emergency Medicine

## 2017-12-07 ENCOUNTER — Other Ambulatory Visit (HOSPITAL_BASED_OUTPATIENT_CLINIC_OR_DEPARTMENT_OTHER): Payer: Self-pay | Admitting: Psychiatry

## 2017-12-07 DIAGNOSIS — F449 Dissociative and conversion disorder, unspecified: Secondary | ICD-10-CM

## 2017-12-07 NOTE — Progress Notes (Signed)
Brianna [name] found it in Epic!     Encouter 04/09/2009 with Max Schmidheiser

## 2017-12-09 ENCOUNTER — Ambulatory Visit (HOSPITAL_BASED_OUTPATIENT_CLINIC_OR_DEPARTMENT_OTHER): Payer: MEDICARE | Admitting: Psychiatry

## 2017-12-09 ENCOUNTER — Encounter (HOSPITAL_BASED_OUTPATIENT_CLINIC_OR_DEPARTMENT_OTHER): Payer: Self-pay | Admitting: Family Medicine

## 2017-12-14 ENCOUNTER — Ambulatory Visit (HOSPITAL_BASED_OUTPATIENT_CLINIC_OR_DEPARTMENT_OTHER): Payer: MEDICARE | Admitting: Emergency Medicine

## 2017-12-14 DIAGNOSIS — F449 Dissociative and conversion disorder, unspecified: Principal | ICD-10-CM

## 2017-12-15 NOTE — Progress Notes (Signed)
OUTPATIENT PSYCHIATRY PROGRESS NOTE    INTERPRETER: No    CONTACT INFO FOR OTHER AGENCIES AND MENTAL HEALTH PROVIDERS (IF APPLICABLE): N/a     PROBLEM(S) ADDRESSED IN THIS SESSION: attachment relationships, emotional literacy      SUBJECTIVE  TODAY'S CHIEF COMPLAINT AND CLINICAL UPDATES IN PATIENT'S WORDS:  1) Chief Complaint (Patient and/or guardian's own words, concerns and expressed thoughts): "It boggles my mind"    2) New information from patient and/or collateral (Patient's illness: context, course, modifying factors, severity, cultural, family, social, medical history): N/A    OBJECTIVE  DATA REVIEWED (Consider medical labs, radiology, other medical tests; screening/outcome measures; psychological testing; discussion of test results with other clinicians; consultation with other clinicians and systems involved with patient, summary of old records): n/a     CURRENT MEDICATIONS (make clear medications prescribed by psychiatry; include OTC medications):  Current Outpatient Medications   Medication Sig   . co-enzyme Q-10 30 MG capsule Take 1 capsule by mouth 3 (three) times daily   . Omega-3 Fatty Acids (FISH OIL) 1000 MG CAPS Take by mouth   . propranolol (INDERAL) 10 MG tablet Take 1 tablet by mouth 3 (three) times daily as needed For rapid heart rate or anxiety symptoms.   . THEANINE PO Take  by mouth.   . Multiple Vitamin (MULTIVITAMINS PO)      No current facility-administered medications for this visit.        MEDICATION ADHERENCE (including barriers and how addressed):N/A    MEDICATION SIDE EFFECTS (Prescribers Only): N/A    BIRTH CONTROL (ask females and males): N/A    CURRENT PREGNANCY: N/A                                    MENTAL STATUS EXAMINATION                     General Appearance: Within normal limits     Interaction with Interviewer (eye contact, attitude, behavior): often averting gaze,often closing eyes    Physical Signs    Gait and Station (how patient walks and stands): Within normal limits                   Physical Appearance: Within normal limits          Normal Movements: Within normal limits         Speech (rate, volume, articulation): variable speed, low volume          Language: Within normal limits                       Mood: depressed            Affect: congruent            Thought Process     Rate; concrete vs abstract reasoning: Within normal limits        Logical vs illogical; associations: loose, tangential, circumstantial, intact: Within normal limits                                    Thought Content    Normal Thought Content (other than safety): Within normal limits     Perceptions (auditory, visual, tactile, etc.): Within normal limits       Impulse Control: fair  Cognition (Link to [facility])    Orientation (person, place, time): Within normal limits      Recent and remote memory: Within normal limits           Attention span and concentration: Within normal limits         Fund of knowledge, awareness of current events and vocabulary: Within normal limits      Judgment: Within normal limits          Insight: limited        Suicidality/Homicidality/Aggression (Victimization or Perpetration): Within normal limits     ASSESSMENT   Today's Assessment:  Continued to explore what pt feels has been effective and not, explored how pt has understood a sense of connection with other providers. Attempted to explore pt's internal experience but pt explained that she "did not feel safe" to do this with this Probation officer.      Risk Level Assessment  Risk Level Change (if yes, please describe): No    Suicide: low (1) to moderate (2) - feeling increasingly hopeless  Violence: low (1)  Addiction: low (1)        Protective Factors: help seeking.     DIAGNOSES ASSESSED TODAY (psychiatric diagnoses and medical diagnoses that factor into management of psychiatric treatment): DID unspecified ,  PTSD,    CLINICAL FORMULATION (Make changes as your understanding changes. 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 Pt represents for [facility] services to continue this work and has several strengths including intelligence, sense of humor and persistence.     REVIEWING TODAY'S VISIT  CLINICAL INTERVENTIONS TODAY: Continue to estable therapeutic alliance, increasing emotional literacy, exploring interpersonal styles and boundary setting       PATIENT'S RESPONSE TO INTERVENTIONS: cooperative     PROGRESS TOWARDS GOALS: n/a     TIME SPENT IN PSYCHOTHERAPY: 45 min     PLAN  PLAN FOR MANAGING RISK (Consider risk plan for patients at moderate or high risk for suicide/violence/addiction; medication plan; referrals, etc. Must coincide with treatment plan.): N/A    PLAN FOR ONGOING TREATMENT:  Continue with OP therapy     INFORMED CONSENT (for any new treatment): N/A      ONLY FOR PRESCRIBERS DOING EVALUATION AND MANAGEMENT VISITS     Use only when no psychotherapy performed  COUNSELING AND COORDINATION OF CARE PROVIDED (Consider diagnostic results/impressions and/or recommended studies; risks and benefits of treatment options; instruction for management/treatment and/or follow-up; importance of compliance with chosen treatment options; risk factor reduction; patient/family/caregiver education; prognosis): N/A      Over 50% of time during today's visit was devoted to counseling and/or coordination of care.  If yes, record estimated duration of the face to face encounter.  N/A    INSTRUCTIONS TO COVERING CLINICIANS:  N/A

## 2017-12-17 ENCOUNTER — Ambulatory Visit (HOSPITAL_BASED_OUTPATIENT_CLINIC_OR_DEPARTMENT_OTHER): Payer: MEDICARE | Admitting: Registered"

## 2017-12-17 NOTE — Progress Notes (Signed)
Patient seen briefly as has medicare as insurance chart closed as erroneous per departmental protocol

## 2017-12-21 ENCOUNTER — Ambulatory Visit (HOSPITAL_BASED_OUTPATIENT_CLINIC_OR_DEPARTMENT_OTHER): Payer: MEDICARE | Admitting: Emergency Medicine

## 2017-12-21 DIAGNOSIS — F449 Dissociative and conversion disorder, unspecified: Principal | ICD-10-CM

## 2017-12-21 NOTE — Progress Notes (Signed)
OUTPATIENT PSYCHIATRY PROGRESS NOTE    INTERPRETER: No    CONTACT INFO FOR OTHER AGENCIES AND MENTAL HEALTH PROVIDERS (IF APPLICABLE): N/a     PROBLEM(S) ADDRESSED IN THIS SESSION: therapeutic relationships, emotional literacy      SUBJECTIVE  TODAY'S CHIEF COMPLAINT AND CLINICAL UPDATES IN PATIENT'S WORDS:  1) Chief Complaint (Patient and/or guardian's own words, concerns and expressed thoughts): "I don't understand why I have to work so hard"     2) New information from patient and/or collateral (Patient's illness: context, course, modifying factors, severity, cultural, family, social, medical history): N/A    OBJECTIVE  DATA REVIEWED (Consider medical labs, radiology, other medical tests; screening/outcome measures; psychological testing; discussion of test results with other clinicians; consultation with other clinicians and systems involved with patient, summary of old records): n/a     CURRENT MEDICATIONS (make clear medications prescribed by psychiatry; include OTC medications):  Current Outpatient Medications   Medication Sig   . co-enzyme Q-10 30 MG capsule Take 1 capsule by mouth 3 (three) times daily   . Omega-3 Fatty Acids (FISH OIL) 1000 MG CAPS Take by mouth   . propranolol (INDERAL) 10 MG tablet Take 1 tablet by mouth 3 (three) times daily as needed For rapid heart rate or anxiety symptoms.   . THEANINE PO Take  by mouth.   . Multiple Vitamin (MULTIVITAMINS PO)      No current facility-administered medications for this visit.        MEDICATION ADHERENCE (including barriers and how addressed):N/A    MEDICATION SIDE EFFECTS (Prescribers Only): N/A    BIRTH CONTROL (ask females and males): N/A    CURRENT PREGNANCY: N/A                                    MENTAL STATUS EXAMINATION                     General Appearance: Within normal limits     Interaction with Interviewer (eye contact, attitude, behavior): often averting gaze,often closing eyes    Physical Signs    Gait and Station (how patient walks and  stands): Within normal limits                  Physical Appearance: Within normal limits          Normal Movements: Within normal limits         Speech (rate, volume, articulation): variable speed, low volume          Language: Within normal limits                       Mood: depressed            Affect: congruent            Thought Process     Rate; concrete vs abstract reasoning: Within normal limits        Logical vs illogical; associations: loose, tangential, circumstantial, intact: Within normal limits                                    Thought Content    Normal Thought Content (other than safety): Within normal limits     Perceptions (auditory, visual, tactile, etc.): Within normal limits       Impulse Control: fair  Cognition (Link to [facility])    Orientation (person, place, time): Within normal limits      Recent and remote memory: Within normal limits           Attention span and concentration: Within normal limits         Fund of knowledge, awareness of current events and vocabulary: Within normal limits      Judgment: Within normal limits          Insight: limited        Suicidality/Homicidality/Aggression (Victimization or Perpetration): Within normal limits     ASSESSMENT   Today's Assessment:  Continued to explore what pt feels has been effective and not in therapeutic relationships; patient able to acknowledge her role in some of the problems but feels that this should be understood in the context of the skills seh lacks interpersonally, rather than blamed which is what she feels has occurred in the past. Explored ways in which empathic failures overshadow many instances of attunement.      Risk Level Assessment  Risk Level Change (if yes, please describe): No    Suicide: low (1) to moderate (2) - feeling increasingly hopeless  Violence: low (1)  Addiction: low (1)        Protective Factors: help seeking.     DIAGNOSES ASSESSED TODAY (psychiatric diagnoses and medical diagnoses that factor into  management of psychiatric treatment): DID unspecified ,  PTSD,    CLINICAL FORMULATION (Make changes as your understanding changes. 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 Pt represents for [facility] services to continue this work and has several strengths including intelligence, sense of humor and persistence.     REVIEWING TODAY'S VISIT  CLINICAL INTERVENTIONS TODAY: Continue to estable therapeutic alliance, increasing emotional literacy, exploring interpersonal styles and boundary setting       PATIENT'S RESPONSE TO INTERVENTIONS: cooperative     PROGRESS TOWARDS GOALS: n/a     TIME SPENT IN PSYCHOTHERAPY: 45 min     PLAN  PLAN FOR MANAGING RISK (Consider risk plan for patients at moderate or high risk for suicide/violence/addiction; medication plan; referrals, etc. Must coincide with treatment plan.): N/A    PLAN FOR ONGOING TREATMENT:  Continue with OP therapy     INFORMED CONSENT (for any new treatment): N/A      ONLY FOR PRESCRIBERS DOING EVALUATION AND MANAGEMENT VISITS     Use only when no psychotherapy performed  COUNSELING AND COORDINATION OF CARE PROVIDED (Consider diagnostic results/impressions and/or recommended studies; risks and benefits of treatment options; instruction for management/treatment and/or follow-up; importance of compliance with chosen treatment options; risk factor reduction; patient/family/caregiver education; prognosis): N/A      Over 50% of time during today's visit was devoted to counseling and/or coordination of care.  If  yes, record estimated duration of the face to face encounter.  N/A    INSTRUCTIONS TO COVERING CLINICIANS:  N/A

## 2017-12-25 NOTE — Progress Notes (Signed)
OUTPATIENT PSYCHIATRY PROGRESS NOTE    INTERPRETER: No    CONTACT INFO FOR OTHER AGENCIES AND MENTAL HEALTH PROVIDERS (IF APPLICABLE): N/a     PROBLEM(S) ADDRESSED IN THIS SESSION: attachment relationships, emotional literacy      SUBJECTIVE  TODAY'S CHIEF COMPLAINT AND CLINICAL UPDATES IN PATIENT'S WORDS:  1) Chief Complaint (Patient and/or guardian's own words, concerns and expressed thoughts): "It's really hard to think about"     2) New information from patient and/or collateral (Patient's illness: context, course, modifying factors, severity, cultural, family, social, medical history): N/A    OBJECTIVE  DATA REVIEWED (Consider medical labs, radiology, other medical tests; screening/outcome measures; psychological testing; discussion of test results with other clinicians; consultation with other clinicians and systems involved with patient, summary of old records): n/a     CURRENT MEDICATIONS (make clear medications prescribed by psychiatry; include OTC medications):  Current Outpatient Medications   Medication Sig   . co-enzyme Q-10 30 MG capsule Take 1 capsule by mouth 3 (three) times daily   . Omega-3 Fatty Acids (FISH OIL) 1000 MG CAPS Take by mouth   . propranolol (INDERAL) 10 MG tablet Take 1 tablet by mouth 3 (three) times daily as needed For rapid heart rate or anxiety symptoms.   . THEANINE PO Take  by mouth.   . Multiple Vitamin (MULTIVITAMINS PO)      No current facility-administered medications for this visit.        MEDICATION ADHERENCE (including barriers and how addressed):N/A    MEDICATION SIDE EFFECTS (Prescribers Only): N/A    BIRTH CONTROL (ask females and males): N/A    CURRENT PREGNANCY: N/A                                    MENTAL STATUS EXAMINATION                     General Appearance: Within normal limits     Interaction with Interviewer (eye contact, attitude, behavior): often averting gaze,often closing eyes    Physical Signs    Gait and Station (how patient walks and stands): Within  normal limits                  Physical Appearance: Within normal limits          Normal Movements: Within normal limits         Speech (rate, volume, articulation): variable speed, low volume          Language: Within normal limits                       Mood: depressed            Affect: labile            Thought Process     Rate; concrete vs abstract reasoning: Within normal limits        Logical vs illogical; associations: loose, tangential, circumstantial, intact: Within normal limits                                    Thought Content    Normal Thought Content (other than safety): Within normal limits     Perceptions (auditory, visual, tactile, etc.): Within normal limits       Impulse Control: fair  Cognition (Link to [facility])    Orientation (person, place, time): Within normal limits      Recent and remote memory: Within normal limits           Attention span and concentration: Within normal limits         Fund of knowledge, awareness of current events and vocabulary: Within normal limits      Judgment: Within normal limits          Insight: fair        Suicidality/Homicidality/Aggression (Victimization or Perpetration): Within normal limits     ASSESSMENT   Today's Assessment:  Continued to explore feelings relating to possibly transitioning to another therapist, what goals are for therapy, frustration at feeling unhelped by the system    Risk Level Assessment  Risk Level Change (if yes, please describe): No    Suicide: low (1) to moderate (2) - feeling increasingly hopeless  Violence: low (1)  Addiction: low (1)        Protective Factors: help seeking.     DIAGNOSES ASSESSED TODAY (psychiatric diagnoses and medical diagnoses that factor into management of psychiatric treatment): DID unspecified ,  PTSD,    CLINICAL FORMULATION (Make changes as your understanding changes. 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 Pt represents for [facility] services to continue this work and has several strengths including intelligence, sense of humor and persistence.     REVIEWING TODAY'S VISIT  CLINICAL INTERVENTIONS TODAY: Continue to estable therapeutic alliance, increasing emotional literacy, exploring interpersonal styles and boundary setting       PATIENT'S RESPONSE TO INTERVENTIONS: cooperative     PROGRESS TOWARDS GOALS: n/a     TIME SPENT IN PSYCHOTHERAPY: 45 min     PLAN  PLAN FOR MANAGING RISK (Consider risk plan for patients at moderate or high risk for suicide/violence/addiction; medication plan; referrals, etc. Must coincide with treatment plan.): N/A    PLAN FOR ONGOING TREATMENT:  Continue with OP therapy     INFORMED CONSENT (for any new treatment): N/A      ONLY FOR PRESCRIBERS DOING EVALUATION AND MANAGEMENT VISITS     Use only when no psychotherapy performed  COUNSELING AND COORDINATION OF CARE PROVIDED (Consider diagnostic results/impressions and/or recommended studies; risks and benefits of treatment options; instruction for management/treatment and/or follow-up; importance of compliance with chosen treatment options; risk factor reduction; patient/family/caregiver education; prognosis): N/A      Over 50% of time during today's visit was devoted to counseling and/or coordination of care.  If yes, record estimated duration of the face to face encounter.  N/A    INSTRUCTIONS TO COVERING CLINICIANS:  N/A

## 2017-12-28 ENCOUNTER — Ambulatory Visit (HOSPITAL_BASED_OUTPATIENT_CLINIC_OR_DEPARTMENT_OTHER): Payer: MEDICARE | Admitting: Emergency Medicine

## 2017-12-28 DIAGNOSIS — F449 Dissociative and conversion disorder, unspecified: Principal | ICD-10-CM

## 2017-12-28 NOTE — Progress Notes (Signed)
OUTPATIENT PSYCHIATRY PROGRESS NOTE    INTERPRETER: No    CONTACT INFO FOR OTHER AGENCIES AND MENTAL HEALTH PROVIDERS (IF APPLICABLE): N/a     PROBLEM(S) ADDRESSED IN THIS SESSION: interpersonal relationships, emotional literacy      SUBJECTIVE  TODAY'S CHIEF COMPLAINT AND CLINICAL UPDATES IN PATIENT'S WORDS:  1) Chief Complaint (Patient and/or guardian's own words, concerns and expressed thoughts): "There aren't words"      2) New information from patient and/or collateral (Patient's illness: context, course, modifying factors, severity, cultural, family, social, medical history): N/A    OBJECTIVE  DATA REVIEWED (Consider medical labs, radiology, other medical tests; screening/outcome measures; psychological testing; discussion of test results with other clinicians; consultation with other clinicians and systems involved with patient, summary of old records): n/a     CURRENT MEDICATIONS (make clear medications prescribed by psychiatry; include OTC medications):  Current Outpatient Medications   Medication Sig   . co-enzyme Q-10 30 MG capsule Take 1 capsule by mouth 3 (three) times daily   . Omega-3 Fatty Acids (FISH OIL) 1000 MG CAPS Take by mouth   . propranolol (INDERAL) 10 MG tablet Take 1 tablet by mouth 3 (three) times daily as needed For rapid heart rate or anxiety symptoms.   . THEANINE PO Take  by mouth.   . Multiple Vitamin (MULTIVITAMINS PO)      No current facility-administered medications for this visit.        MEDICATION ADHERENCE (including barriers and how addressed):N/A    MEDICATION SIDE EFFECTS (Prescribers Only): N/A    BIRTH CONTROL (ask females and males): N/A    CURRENT PREGNANCY: N/A                                    MENTAL STATUS EXAMINATION                     General Appearance: Within normal limits     Interaction with Interviewer (eye contact, attitude, behavior): increased eye contact    Physical Signs    Gait and Station (how patient walks and stands): Within normal limits                   Physical Appearance: Within normal limits          Normal Movements: Within normal limits         Speech (rate, volume, articulation): variable speed, low volume          Language: Within normal limits                       Mood: irritable            Affect: congruent            Thought Process     Rate; concrete vs abstract reasoning: Within normal limits        Logical vs illogical; associations: loose, tangential, circumstantial, intact: Within normal limits                                    Thought Content    Normal Thought Content (other than safety): Within normal limits     Perceptions (auditory, visual, tactile, etc.): Within normal limits       Impulse Control: fair  Cognition (Link to [facility])    Orientation (person, place, time): Within normal limits      Recent and remote memory: Within normal limits           Attention span and concentration: Within normal limits         Fund of knowledge, awareness of current events and vocabulary: Within normal limits      Judgment: Within normal limits          Insight: limited        Suicidality/Homicidality/Aggression (Victimization or Perpetration): Within normal limits     ASSESSMENT   Today's Assessment:  Continued to explore what pt feels has been ineffective, expresses feeling misunderstood and not heard and how this is very triggering. Explored ways in which both pt and therapist are responsible for misattunements and ruptures and patient wanted to write down TW's expectations for what she wants in therapy. Pt expressed that she didn't want to make people defensive or frustrated, validated this lack of intentionality and dificulty of being in the world this way which pt responded well too.     Risk Level Assessment  Risk Level Change (if yes, please describe): No    Suicide: low (1) to moderate (2) - feeling increasingly hopeless  Violence: low (1)  Addiction: low (1)        Protective Factors: help seeking.     DIAGNOSES ASSESSED TODAY  (psychiatric diagnoses and medical diagnoses that factor into management of psychiatric treatment): DID unspecified ,  PTSD,    CLINICAL FORMULATION (Make changes as your understanding changes. 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 Pt represents for [facility] services to continue this work and has several strengths including intelligence, sense of humor and persistence.     REVIEWING TODAY'S VISIT  CLINICAL INTERVENTIONS TODAY: Continue to estable therapeutic alliance, increasing emotional literacy, exploring interpersonal styles and boundary setting       PATIENT'S RESPONSE TO INTERVENTIONS: cooperative     PROGRESS TOWARDS GOALS: n/a     TIME SPENT IN PSYCHOTHERAPY: 45 min     PLAN  PLAN FOR MANAGING RISK (Consider risk plan for patients at moderate or high risk for suicide/violence/addiction; medication plan; referrals, etc. Must coincide with treatment plan.): N/A    PLAN FOR ONGOING TREATMENT:  Continue with OP therapy     INFORMED CONSENT (for any new treatment): N/A      ONLY FOR PRESCRIBERS DOING EVALUATION AND MANAGEMENT VISITS     Use only when no psychotherapy performed  COUNSELING AND COORDINATION OF CARE PROVIDED (Consider diagnostic results/impressions and/or recommended studies; risks and benefits of treatment options; instruction for management/treatment and/or follow-up; importance of compliance with chosen treatment options; risk factor reduction; patient/family/caregiver education; prognosis): N/A      Over 50% of time during today's visit  was devoted to counseling and/or coordination of care.  If yes, record estimated duration of the face to face encounter.  N/A    INSTRUCTIONS TO COVERING CLINICIANS:  N/A

## 2017-12-29 ENCOUNTER — Ambulatory Visit (HOSPITAL_BASED_OUTPATIENT_CLINIC_OR_DEPARTMENT_OTHER): Payer: Self-pay | Admitting: Endocrinology

## 2017-12-29 DIAGNOSIS — Z780 Asymptomatic menopausal state: Secondary | ICD-10-CM

## 2017-12-29 DIAGNOSIS — M8580 Other specified disorders of bone density and structure, unspecified site: Secondary | ICD-10-CM

## 2017-12-29 NOTE — Addendum Note (Signed)
Addended by: Allen Derry on: 12/29/2017 10:46 AM     Modules accepted: Orders

## 2017-12-30 ENCOUNTER — Ambulatory Visit (HOSPITAL_BASED_OUTPATIENT_CLINIC_OR_DEPARTMENT_OTHER): Payer: MEDICARE | Admitting: Psychologist

## 2017-12-31 ENCOUNTER — Ambulatory Visit (HOSPITAL_BASED_OUTPATIENT_CLINIC_OR_DEPARTMENT_OTHER): Payer: Self-pay

## 2017-12-31 NOTE — Dental Note (Signed)
Patient presented for a periodic exam.    Patient pain level at beginning of appointment:    Interpreter: None    **Medical/Brianna [name] History**  Chief complaint:  History of present illness:  Pertinent medical alerts:  Medical history: No change    **Periodic Exam**  Extraoral exam: WNL (normal and symmetrical)  Intraoral exam: WNL (good hygiene, no masses, ulcerations, or discoloration)  Occlusion classification: Class I  Periodontal probing: N/A  Periodontal exam: Generalized normal tissue, pink, knife-edge, filled  embrasure, firm, stippled  Radiographs taken:  Radiographs taken to evaluate for:  Radiographic exam:    **Assessment**  Prescription given:  Referral:  Assessment plan: spot prob    Patient pain level at end of appointment:    Patient left ambulatory, satisfied with treatment. Any remaining planned  treatments were explained in detail to the patient.    Assistant:    Next visit: What:  Reason:    ----- Signed on Tuesday, February 23, 2018 at 2:44:22 PM  -----  ----- Provider: Caren Macadam - Peter Garter, DDS -- Clinic: [facility] -----

## 2017-12-31 NOTE — Dental Note (Signed)
Patient presented for a periodic exam.    Patient pain level at beginning of appointment:  0    Interpreter: None    **Medical/Brayten Komar History**  Chief complaint: exam  cold sensitivity upper anterion  slight senditivity lower left crown  new implant upper right  History of present illness:  Pertinent medical alerts:  Medical history: No change    **Periodic Exam**  Extraoral exam: WNL (normal and symmetrical)  Intraoral exam: WNL (good hygiene, no masses, ulcerations, or discoloration)  Occlusion classification: Class I  Periodontal probing: N/A  Periodontal exam: Generalized normal tissue, pink, knife-edge, filled  embrasure, firm, stippled  Radiographs taken:  Radiographs taken to evaluate for:  Radiographic exam: Caries    **Assessment**  Prescription given: None  Referral: Not given  Assessment plan: spot probing    Patient pain level at end of appointment: 0    Patient left ambulatory, satisfied with treatment. Any remaining planned  treatments were explained in detail to the patient.    Assistant: Brianna [name]    Next visit: What:comp fill #9  Reason:    ----- Signed on Thursday, Dec 31, 2017 at 9:47:57 AM  -----  ----- Provider: Elpidio [name],  -- Clinic: [facility] -----  ----- Override Provider: Roney Jaffe, DDS -----

## 2017-12-31 NOTE — Dental Note (Signed)
Medical Alert: Allergies    Mental Disorders    Other    Vitamin Deficiency    Depression  Medications: Other    Propranolol    Clindamycin 300 mg  Allergies:      Penicillin  Since Last Visit: Medical Alert: No Change    Medications: No Change    Allergies:        No Change  Pain Scale Type: Numeric Pain Scale Pain Level: 0  Description:    Patient Health Assessment    Date:            12/31/2017  Blood Pressure:  100/68  Pulse:           70  Age:             61  Weight:          0 lbs  Height/Length:   0\' 0"   Body Mass Index: 0.0  Provider:        [facility]  Clinic:          [facility]        ----- Signed on Tuesday, February 23, 2018 at 2:44:22 PM  -----  ----- Provider: Caren Macadam - Peter Garter, DDS -- Clinic: [facility] -----

## 2018-01-01 ENCOUNTER — Ambulatory Visit (HOSPITAL_BASED_OUTPATIENT_CLINIC_OR_DEPARTMENT_OTHER): Payer: MEDICARE | Admitting: Psychologist

## 2018-01-05 ENCOUNTER — Ambulatory Visit (HOSPITAL_BASED_OUTPATIENT_CLINIC_OR_DEPARTMENT_OTHER): Payer: Self-pay

## 2018-01-05 NOTE — Dental Note (Signed)
Patient presented for a composite restoration #9, MF    Patient pain level at beginning of appointment: 0    Interpreter: None    *Medical/Shriya Aker History*  Chief complaint: none  HPI: n/a  Medical history reviewed.    *Composite Restoration*  Reason(s) for restoration(s): Recurrent caries  Radiographs taken: None taken  Radiographs taken to evaluate for: N/A  Local anesthetic used: 2% Lidocaine (1:100,000 epinephrine)  Whole number of carpules used: 1  Type of injection: Maxillary infiltration  Existing material removed: Composite  Liner and base placed: Lime-Lite  Composite material used: Filtek Supreme Ultra  Shade: A1    The tooth was prepared with adequate water irrigation. 40% phosphoric acid  etchant was placed on preparation and rinsed off completely after 15-30  seconds. Tooth was dried and isolated. Bonding agent placed, air gently  blown, and light cured for 20-30 seconds. Selected composite material(s)  was/were placed and light cured for at least 40 seconds. Margins, contacts,   and   occlusion were checked and adjusted as needed. Polished. Post-op  instructions given.    *Assessment*  Prescription given: None  Referral: Not given    Patient pain level at end of appointment: 0    Patient left ambulatory, satisfied with treatment. Any remaining planned  treatments were explained in detail to the patient.    Assistant: Berta Minor    Next visit: What:recare  Reason:recare    ----- Signed on Tuesday, Jan 05, 2018 at 9:44:34 AM  -----  ----- Provider: Theodora Blow - Meskerem Theodosia Paling,  -- Clinic: [facility] -----  ----- Override Provider: Barry Dienes, DMD -----

## 2018-01-05 NOTE — Dental Note (Signed)
Medical Alert: Allergies    Mental Disorders    Other    Vitamin Deficiency    Depression  Medications: Other    Propranolol    Allergies:      Penicillin  Since Last Visit: Medical Alert: No Change    Medications: No Change    Allergies:        No Change  Pain Scale Type: Numeric Pain Scale Pain Level: 0    ----- Signed on Tuesday, Jan 05, 2018 at 9:43:43 AM  -----  ----- Provider: Theodora Blow - Meskerem Theodosia Paling,  -- Clinic: [facility] -----  ----- Override Provider: Barry Dienes, DMD -----

## 2018-01-11 ENCOUNTER — Ambulatory Visit (HOSPITAL_BASED_OUTPATIENT_CLINIC_OR_DEPARTMENT_OTHER): Payer: MEDICARE | Admitting: Emergency Medicine

## 2018-01-11 DIAGNOSIS — F449 Dissociative and conversion disorder, unspecified: Principal | ICD-10-CM

## 2018-01-11 LAB — XR DXA BONE DENSITOMETRY

## 2018-01-12 NOTE — Progress Notes (Signed)
OUTPATIENT PSYCHIATRY PROGRESS NOTE    INTERPRETER: No    CONTACT INFO FOR OTHER AGENCIES AND MENTAL HEALTH PROVIDERS (IF APPLICABLE): N/a     PROBLEM(S) ADDRESSED IN THIS SESSION: interpersonal relationships, emotional literacy      SUBJECTIVE  TODAY'S CHIEF COMPLAINT AND CLINICAL UPDATES IN PATIENT'S WORDS:  1) Chief Complaint (Patient and/or guardian's own words, concerns and expressed thoughts): "I want to know what phrase to tell Diya"     2) New information from patient and/or collateral (Patient's illness: context, course, modifying factors, severity, cultural, family, social, medical history): N/A    OBJECTIVE  DATA REVIEWED (Consider medical labs, radiology, other medical tests; screening/outcome measures; psychological testing; discussion of test results with other clinicians; consultation with other clinicians and systems involved with patient, summary of old records): n/a     CURRENT MEDICATIONS (make clear medications prescribed by psychiatry; include OTC medications):  Current Outpatient Medications   Medication Sig   . co-enzyme Q-10 30 MG capsule Take 1 capsule by mouth 3 (three) times daily   . Omega-3 Fatty Acids (FISH OIL) 1000 MG CAPS Take by mouth   . propranolol (INDERAL) 10 MG tablet Take 1 tablet by mouth 3 (three) times daily as needed For rapid heart rate or anxiety symptoms.   . THEANINE PO Take  by mouth.   . Multiple Vitamin (MULTIVITAMINS PO)      No current facility-administered medications for this visit.        MEDICATION ADHERENCE (including barriers and how addressed):N/A    MEDICATION SIDE EFFECTS (Prescribers Only): N/A    BIRTH CONTROL (ask females and males): N/A    CURRENT PREGNANCY: N/A                                    MENTAL STATUS EXAMINATION                     General Appearance: Within normal limits     Interaction with Interviewer (eye contact, attitude, behavior): increased eye contact    Physical Signs    Gait and Station (how patient walks and stands): Within  normal limits                  Physical Appearance: Within normal limits          Normal Movements: Within normal limits         Speech (rate, volume, articulation): variable speed, low volume          Language: Within normal limits                       Mood: tense            Affect: congruent            Thought Process     Rate; concrete vs abstract reasoning: Within normal limits        Logical vs illogical; associations: loose, tangential, circumstantial, intact: Within normal limits                                    Thought Content    Normal Thought Content (other than safety): Within normal limits     Perceptions (auditory, visual, tactile, etc.): Within normal limits       Impulse Control: fair  Cognition (Link to [facility])    Orientation (person, place, time): Within normal limits      Recent and remote memory: Within normal limits           Attention span and concentration: Within normal limits         Fund of knowledge, awareness of current events and vocabulary: Within normal limits      Judgment: Within normal limits          Insight: limited        Suicidality/Homicidality/Aggression (Victimization or Perpetration): Within normal limits     ASSESSMENT   Today's Assessment:  Continued to explore what pt feels has been ineffective. What she hopes to gain re: therapy. Explored challenges in therapuetic relationship and expectations versus what the [facility] may have to offer.     Risk Level Assessment  Risk Level Change (if yes, please describe): No    Suicide: low (1) to moderate (2) - feeling increasingly hopeless  Violence: low (1)  Addiction: low (1)        Protective Factors: help seeking.     DIAGNOSES ASSESSED TODAY (psychiatric diagnoses and medical diagnoses that factor into management of psychiatric treatment): DID unspecified ,  PTSD,    CLINICAL FORMULATION (Make changes as your understanding changes. 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 Pt represents for [facility] services to continue this work and has several strengths including intelligence, sense of humor and persistence.     REVIEWING TODAY'S VISIT  CLINICAL INTERVENTIONS TODAY: Continue to estable therapeutic alliance, increasing emotional literacy, exploring interpersonal styles and boundary setting       PATIENT'S RESPONSE TO INTERVENTIONS: cooperative     PROGRESS TOWARDS GOALS: n/a     TIME SPENT IN PSYCHOTHERAPY: 45 min     PLAN  PLAN FOR MANAGING RISK (Consider risk plan for patients at moderate or high risk for suicide/violence/addiction; medication plan; referrals, etc. Must coincide with treatment plan.): N/A    PLAN FOR ONGOING TREATMENT:  Continue with OP therapy     INFORMED CONSENT (for any new treatment): N/A      ONLY FOR PRESCRIBERS DOING EVALUATION AND MANAGEMENT VISITS     Use only when no psychotherapy performed  COUNSELING AND COORDINATION OF CARE PROVIDED (Consider diagnostic results/impressions and/or recommended studies; risks and benefits of treatment options; instruction for management/treatment and/or follow-up; importance of compliance with chosen treatment options; risk factor reduction; patient/family/caregiver education; prognosis): N/A      Over 50% of time during today's visit was devoted to counseling and/or coordination of care.  If yes, record estimated duration of the face to face encounter.  N/A    INSTRUCTIONS TO COVERING CLINICIANS:  N/A

## 2018-01-20 ENCOUNTER — Ambulatory Visit (HOSPITAL_BASED_OUTPATIENT_CLINIC_OR_DEPARTMENT_OTHER): Payer: Self-pay | Admitting: Family Medicine

## 2018-01-20 ENCOUNTER — Ambulatory Visit (HOSPITAL_BASED_OUTPATIENT_CLINIC_OR_DEPARTMENT_OTHER): Payer: MEDICARE | Admitting: Psychologist

## 2018-01-20 DIAGNOSIS — F449 Dissociative and conversion disorder, unspecified: Principal | ICD-10-CM

## 2018-01-20 NOTE — Progress Notes (Signed)
OUTPATIENT PSYCHIATRY PROGRESS NOTE  This is a consult note- pt seen by TW in context of disatisfaction with current treator Gatha Mayer. 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 The context for this consult is her feeling that current therapist is not helpful to her in the larger context of feeling that treatment has not met her needs because providers do not provide the kind of emotional mirroring that she needs. Chart reviewed in detail. Per notes from Dr. Desma Mcgregor, "Pt reports psychotherapy has always been refractory because as part of a constellation of regressive PTSD symptoms, Pt often lashes out angrily at providers which usually provokes counter transferential anger which is re-traumatizing for Pt. Pt notes in past episodes of dissociation and regression, she has ended up on the office floor unable to move and finds therapists become angry when they tell her she has to leave"      Per Dr. Olin Pia recent evaluation, this is an abbreviated but incomplete hx of providers she has had:   [facility]--2003-2007   In 2009 had 6 month of DBT group led by Jeannine Boga -therapy 9/16-7/17-   Therapy with [facility] 05/2010 - 08/01/2010   Ramona Dvorak--Psychopharm Eval-one session on 01/14/11   Daleen Squibb for Psychopharm Eval in [facility]--once on 11/29/15   In addition, Tora Kindred at [facility], multiple consults with [name] and other affiliated [facility] providers, [name] PsyD. Per Dr. Desma Mcgregor, "Pt notes only one helpful therapy is provided by Dr.Debbie Alver Fisher who has a full practice and calls Pt at [facility] when she has openings in her usually full caseload. Pt private pays for these and finds Dr. Alver Fisher is very helpful."      Consult with Dr. Claiborne Rigg centered on working to conceptualize treatment as "good enough" and work through Office manager  but pt has generally had a difficult time doing that.      Given her dissatisfaction with her current treator which is expressed in similar terms to other treatments that have largely been unhelpful, team recommendation was that intensive therapy has not been helpful and perhaps iatrogenically harmful to patient and we do not feel that another reassignment rather than either working through existing relationship would be of benefit. Team recommendations is that she recontact Dr. Alver Fisher with whom she had the most positive experience and attend support groups at [facility].      Pt is not currently at high risk.      In session, pt presents that in part d/t her pre verbal trauma, she struggles to feel understood by therapists and their tone and emotional responsiveness can feel not containing to her "even if the intention is good." says that she has recognized that she presents very verbal but this belies the traumatized child in her who it seems therapy cannot reach. She felt Dr. Alver Fisher to be particularly helpful because "she followed me" instead of a prescribed method. Does not want to try to repair with current treater as it feels to pt that her style (tone, voice) are not containing and also feels that treator should not be expected to change these either.              MENTAL STATUS EXAMINATION  General Appearance: Within normal limits     Interaction with Interviewer (eye contact, attitude, behavior): increased eye contact    Physical Signs    Gait and Station (how patient walks and stands): Within normal limits                  Physical Appearance: Within normal limits          Normal Movements: Within normal limits         Speech (rate, volume, articulation): normal r,r,t        Language: Within normal limits                       Mood: "a little anxious but ok"            Affect: congruent            Thought Process     Rate; concrete vs abstract reasoning: Within normal limits        Logical vs  illogical; associations: loose, tangential, circumstantial, intact: Within normal limits                                    Thought Content    Normal Thought Content (other than safety): Within normal limits     Perceptions (auditory, visual, tactile, etc.): Within normal limits       Impulse Control: fair         Cognition (Link to [facility])    Orientation (person, place, time): Within normal limits      Recent and remote memory: Within normal limits           Attention span and concentration: Within normal limits         Fund of knowledge, awareness of current events and vocabulary: Within normal limits      Judgment: Within normal limits          Insight: limited-mod        Suicidality/Homicidality/Aggression (Victimization or Perpetration): none today     Plan: will see Dr. Alver Fisher as she is available and interested in a referral for a consult to the MBT program at [facility]. TW will pass this information on to her current treator for referral    DX for visit: DD NOS/unspecified  50 mins spent with pt  .

## 2018-01-21 ENCOUNTER — Encounter (HOSPITAL_BASED_OUTPATIENT_CLINIC_OR_DEPARTMENT_OTHER): Payer: Self-pay | Admitting: Family Medicine

## 2018-01-21 ENCOUNTER — Ambulatory Visit (HOSPITAL_BASED_OUTPATIENT_CLINIC_OR_DEPARTMENT_OTHER): Payer: MEDICARE | Admitting: Family Medicine

## 2018-01-21 VITALS — BP 100/60 | HR 74

## 2018-01-21 DIAGNOSIS — F331 Major depressive disorder, recurrent, moderate: Secondary | ICD-10-CM

## 2018-01-21 DIAGNOSIS — F4312 Post-traumatic stress disorder, chronic: Secondary | ICD-10-CM

## 2018-01-21 DIAGNOSIS — R21 Rash and other nonspecific skin eruption: Secondary | ICD-10-CM

## 2018-01-21 DIAGNOSIS — L659 Nonscarring hair loss, unspecified: Secondary | ICD-10-CM

## 2018-01-21 DIAGNOSIS — R232 Flushing: Secondary | ICD-10-CM

## 2018-01-21 DIAGNOSIS — R5382 Chronic fatigue, unspecified: Secondary | ICD-10-CM

## 2018-01-21 NOTE — Patient Instructions (Signed)
Patient Education   Clonidine tablets  What is this medicine?  CLONIDINE (KLOE ni deen) is used to treat high blood pressure, also for hot flashes and nightmares.  This medicine may be used for other purposes; ask your health care provider or pharmacist if you have questions.  COMMON BRAND NAME(S): Catapres  What should I tell my health care provider before I take this medicine?  They need to know if you have any of these conditions:  -kidney disease  -an unusual or allergic reaction to clonidine, other medicines, foods, dyes, or preservatives  -pregnant or trying to get pregnant  -breast-feeding  How should I use this medicine?  Take this medicine by mouth with a glass of water. Follow the directions on the prescription label. Take your doses at regular intervals. Do not take your medicine more often than directed. Do not suddenly stop taking this medicine. You must gradually reduce the dose or you may get a dangerous increase in blood pressure. Ask your doctor or health care professional for advice.  Talk to your pediatrician regarding the use of this medicine in children. Special care may be needed.  Overdosage: If you think you have taken too much of this medicine contact a poison control center or emergency room at once.  NOTE: This medicine is only for you. Do not share this medicine with others.  What if I miss a dose?  If you miss a dose, take it as soon as you can. If it is almost time for your next dose, take only that dose. Do not take double or extra doses.  What may interact with this medicine?  Do not take this medicine with any of the following medications:  -MAOIs like Carbex, Eldepryl, Marplan, Nardil, and Parnate  This medicine may also interact with the following medications:  -barbiturate medicines for inducing sleep or treating seizures like phenobarbital  -certain medicines for blood pressure, heart disease, irregular heart beat  -certain medicines for depression, anxiety, or psychotic  disturbances  -prescription pain medicines  This list may not describe all possible interactions. Give your health care provider a list of all the medicines, herbs, non-prescription drugs, or dietary supplements you use. Also tell them if you smoke, drink alcohol, or use illegal drugs. Some items may interact with your medicine.  What should I watch for while using this medicine?  Visit your doctor or health care professional for regular checks on your progress. Check your heart rate and blood pressure regularly while you are taking this medicine. Ask your doctor or health care professional what your heart rate should be and when you should contact him or her.  You may get drowsy or dizzy. Do not drive, use machinery, or do anything that needs mental alertness until you know how this medicine affects you. To avoid dizzy or fainting spells, do not stand or sit up quickly, especially if you are an older person. Alcohol can make you more drowsy and dizzy. Avoid alcoholic drinks.  Your mouth may get dry. Chewing sugarless gum or sucking hard candy, and drinking plenty of water will help.  Do not treat yourself for coughs, colds, or pain while you are taking this medicine without asking your doctor or health care professional for advice. Some ingredients may increase your blood pressure.  If you are going to have surgery tell your doctor or health care professional that you are taking this medicine.  What side effects may I notice from receiving this medicine?  Side effects  that you should report to your doctor or health care professional as soon as possible:  -allergic reactions like skin rash, itching or hives, swelling of the face, lips, or tongue  -anxiety, nervousness  -chest pain  -depression  -fast, irregular heartbeat  -swelling of feet or legs  -unusually weak or tired  Side effects that usually do not require medical attention (report to your doctor or health care professional if they continue or are  bothersome):  -change in sex drive or performance  -constipation  -headache  This list may not describe all possible side effects. Call your doctor for medical advice about side effects. You may report side effects to FDA at 1-800-FDA-1088.  Where should I keep my medicine?  Keep out of the reach of children.  Store at room temperature between 15 and 30 degrees C (59 and 86 degrees F). Protect from light. Keep container tightly closed. Throw away any unused medicine after the expiration date.  NOTE: This sheet is a summary. It may not cover all possible information. If you have questions about this medicine, talk to your doctor, pharmacist, or health care provider.   2018 Elsevier/Gold Standard (2011-01-22 13:01:28)

## 2018-01-21 NOTE — Progress Notes (Signed)
Brianna [name] is a 62 year old woman presenting for a followup visit.     SUBJECTIVE    Hot Flashes  Notes that over the course of the past 6 months to a year, she has been experiences episodes of intense heat in her body, mainly localized to her torso and upper extremities. These hot flashes are similar to what she experienced about 10 years ago after she went through menopause, but they cause her to sweat more now than then before. Notably, she had about a 10 year period when she didn't experience any hot flashes at all. Brianna [name] has noted that the hot flashes do sometimes occur when she is 'feeling challenged', and wonders if they can be related to stress or emotional states; she says she has been confronting more stress over the past year.     Mood, Anxiety  Brianna [name] continues to experience significant stress, which she attributes to traumatic childhood experiences. She has a long history of dissociation; she has been seeing a therapist for the past few months through [facility], but does not feel like there is a good therapeutic relationship, and is working on getting another therapist. During her episodes of emotional dissociation/distress, she has been experimenting with taking propranolol, which has been helping control her symptoms.     Fatigue  The fatigue that [name] had been experiencing throughout the winter has been improving; she is not sure to attribute this to the improving weather (she has finally been able to get out of her house, go for walks about 30 minutes each day) or starting supplements; she is now taking both fish oil and Coenzyme Q.     Hair Loss  Brianna [name] has a 2 year history of experiencing loss of hair from her scalp, but notes that for the past few month the hair loss has improved somewhat. She also notes that she lost all of the hair on her legs, as she does not need to shave anymore; this happened a few years ago but she didn't think of bringing it up to her physician  until now.     Rash  Brianna [name] notes a rash on her anterior shins, between her knees and extending down onto the top of her foot, that came on about 2 weeks ago. Describes it as brown/red small bumps. She does note walking frequently in the sun for the past few weeks. The rash is not painful or itchy, and she has not noted any leg swelling.       With contributions from Lottie Rater, MS-2    OBJECTIVE     01/21/18  1428   BP: 100/60   Site: Left Arm   Position: Sitting   Cuff Size: Regular   Pulse: 74   SpO2: 97%           Physical Exam   Cardiovascular: Intact distal pulses.   Skin:   Areas of hyperpigmentation noted bilaterally on the anterior shin. Hyperpigmentation is brown in color, and coalesces into small macules.   Complete absence of hair noted on the legs bilaterally distal to the knee.            ASSESSMENT/PLAN    #Hot Flashes  Given long period of absence of hot flashes between menopause and her current onset of symptoms, unlikely to be related to hormonal changes; may be related to emotional stress. Discussed conservative vs. Medical treatment for symptom management; SSRI and Clonidine was discussed as possible options. Given previous  poor response to [facility] Ms. Houp will consider whether she wants to try clonidine; she was sent home with clonidine drug information and will let Dr. Sabra Heck know if she wants to try a low dose.   Plan:  -Start clonidine if requested by patient.     #Major Depressive Disorder, PTSD  Continues to struggle with dissociation secondary to childhood trauma. Continues to see therapist whom she is not very fond of, and is trying to change to a new therapist.   Plan: Encouraged to continue working with therapist, seek out new therapist on psychology today who she feels more comfortable with.     #Rash  Most likely secondary to new sun exposure; not concerning for hypersensitivity reaction or other dermatitis given absence of pain, itch.   Plan:  -encouraged to follow up  if rash persists or any new changes are noted.

## 2018-01-25 ENCOUNTER — Ambulatory Visit (HOSPITAL_BASED_OUTPATIENT_CLINIC_OR_DEPARTMENT_OTHER): Payer: Medicare Other | Admitting: Emergency Medicine

## 2018-01-25 DIAGNOSIS — F449 Dissociative and conversion disorder, unspecified: Principal | ICD-10-CM

## 2018-01-26 ENCOUNTER — Other Ambulatory Visit (HOSPITAL_BASED_OUTPATIENT_CLINIC_OR_DEPARTMENT_OTHER): Payer: Self-pay | Admitting: Social Worker

## 2018-01-26 NOTE — Progress Notes (Signed)
A message was left for the patient, asking her to call us back at [phone] to schedule an appointment.    ([facility] Over 52 )

## 2018-01-26 NOTE — Progress Notes (Signed)
OUTPATIENT PSYCHIATRY PROGRESS NOTE    INTERPRETER: No    CONTACT INFO FOR OTHER AGENCIES AND MENTAL HEALTH PROVIDERS (IF APPLICABLE): N/a     PROBLEM(S) ADDRESSED IN THIS SESSION: termination, interpersonal relationships, emotional literacy      SUBJECTIVE  TODAY'S CHIEF COMPLAINT AND CLINICAL UPDATES IN PATIENT'S WORDS:  1) Chief Complaint (Patient and/or guardian's own words, concerns and expressed thoughts): "I just want you to understand the impact it had on me"     2) New information from patient and/or collateral (Patient's illness: context, course, modifying factors, severity, cultural, family, social, medical history): N/A    OBJECTIVE  DATA REVIEWED (Consider medical labs, radiology, other medical tests; screening/outcome measures; psychological testing; discussion of test results with other clinicians; consultation with other clinicians and systems involved with patient, summary of old records): n/a     CURRENT MEDICATIONS (make clear medications prescribed by psychiatry; include OTC medications):  Current Outpatient Medications   Medication Sig   . co-enzyme Q-10 30 MG capsule Take 1 capsule by mouth 3 (three) times daily   . Omega-3 Fatty Acids (FISH OIL) 1000 MG CAPS Take by mouth   . propranolol (INDERAL) 10 MG tablet Take 1 tablet by mouth 3 (three) times daily as needed For rapid heart rate or anxiety symptoms.   . THEANINE PO Take  by mouth.   . Multiple Vitamin (MULTIVITAMINS PO)      No current facility-administered medications for this visit.        MEDICATION ADHERENCE (including barriers and how addressed):N/A    MEDICATION SIDE EFFECTS (Prescribers Only): N/A    BIRTH CONTROL (ask females and males): N/A    CURRENT PREGNANCY: N/A                                    MENTAL STATUS EXAMINATION                     General Appearance: Within normal limits     Interaction with Interviewer (eye contact, attitude, behavior): increased eye contact    Physical Signs    Gait and Station (how patient  walks and stands): Within normal limits                  Physical Appearance: Within normal limits          Normal Movements: Within normal limits         Speech (rate, volume, articulation): variable speed, low volume          Language: Within normal limits                       Mood: tense            Affect: congruent            Thought Process     Rate; concrete vs abstract reasoning: Within normal limits        Logical vs illogical; associations: loose, tangential, circumstantial, intact: Within normal limits                                    Thought Content    Normal Thought Content (other than safety): Within normal limits     Perceptions (auditory, visual, tactile, etc.): Within normal limits       Impulse Control:  fair         Cognition (Link to [facility])    Orientation (person, place, time): Within normal limits      Recent and remote memory: Within normal limits           Attention span and concentration: Within normal limits         Fund of knowledge, awareness of current events and vocabulary: Within normal limits      Judgment: Within normal limits          Insight: limited        Suicidality/Homicidality/Aggression (Victimization or Perpetration): Within normal limits     ASSESSMENT   Today's Assessment:  Discussed termination, pt reviewed the things she felt this writer should know re: iatrogenic effects of treatment. Pt agreed to mentalization based outpatient program referral.      Risk Level Assessment  Risk Level Change (if yes, please describe): No    Suicide: low (1) to moderate (2) - feeling increasingly hopeless  Violence: low (1)  Addiction: low (1)        Protective Factors: help seeking.     DIAGNOSES ASSESSED TODAY (psychiatric diagnoses and medical diagnoses that factor into management of psychiatric treatment): DID unspecified ,  PTSD,    CLINICAL FORMULATION (Make changes as your understanding changes. Should coincide with treatment plan): Brianna [name] is an intelligent 62 yo single (never  married), domiciled, woman on disability, who has a hx of DID and PTSD, who has a hx of early childhood trauma and disrupted attachments which has manifested in long hx of relational difficulties across several life domains and a hx of affective and behavioral dsyregulation but more recently has been able to develop some skills to more effectively self-regulate and manage social interactions more successfully in therapy. Pt represents for VOV services to continue this work and has several strengths including intelligence, sense of humor and persistence.     REVIEWING TODAY'S VISIT  CLINICAL INTERVENTIONS TODAY: Continue to estable therapeutic alliance, increasing emotional literacy, exploring interpersonal styles and boundary setting       PATIENT'S RESPONSE TO INTERVENTIONS: cooperative     PROGRESS TOWARDS GOALS: n/a     TIME SPENT IN PSYCHOTHERAPY: 45 min     PLAN  PLAN FOR MANAGING RISK (Consider risk plan for patients at moderate or high risk for suicide/violence/addiction; medication plan; referrals, etc. Must coincide with treatment plan.): N/A    PLAN FOR ONGOING TREATMENT:  Continue with OP therapy     INFORMED CONSENT (for any new treatment): N/A      ONLY FOR PRESCRIBERS DOING EVALUATION AND MANAGEMENT VISITS     Use only when no psychotherapy performed  COUNSELING AND COORDINATION OF CARE PROVIDED (Consider diagnostic results/impressions and/or recommended studies; risks and benefits of treatment options; instruction for management/treatment and/or follow-up; importance of compliance with chosen treatment options; risk factor reduction; patient/family/caregiver education; prognosis): N/A      Over 50% of time during today's visit was devoted to counseling and/or coordination of care.  If yes, record estimated duration of the face to face encounter.  N/A    INSTRUCTIONS TO COVERING CLINICIANS:  N/A

## 2018-01-26 NOTE — Addendum Note (Signed)
Addended by: Dorothy Puffer on: 01/26/2018 03:27 PM     Modules accepted: Orders

## 2018-02-01 ENCOUNTER — Ambulatory Visit (HOSPITAL_BASED_OUTPATIENT_CLINIC_OR_DEPARTMENT_OTHER): Payer: Medicare Other | Admitting: Emergency Medicine

## 2018-02-01 DIAGNOSIS — F449 Dissociative and conversion disorder, unspecified: Principal | ICD-10-CM

## 2018-02-02 NOTE — Progress Notes (Signed)
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 Per evaluation by Dr. Desma Mcgregor, "Pt reports psychotherapy has always been refractory because as part of a constellation of regressive PTSD symptoms, Pt often lashes out angrily at providers which usually provokes counter transferential anger which is re-traumatizing for Pt. Pt notes in past episodes of dissociation and regression, she has ended up on the office floor unable to move and finds therapists become angry when they tell her she has to leave"     Pt came to treatment again to decrease dissociative tendencies/increase emotional literacy,  improve her interpersonal skills and to learn to "deal with her pain."       Treatment course (response to medications, compliance): Pt came weekly to all scheduled sessions and although there seemed to be initial rapport built in the first month, patient reports that she often felt that this writer was blaming her and that this Probation officer did not acknowledge or recognize her degree of pain or the level of her social skill deficit. Pt has demonstrated growth per chart review, is able to acknowledge her role in her relational patterns with therapists but often expressed frustration that the attempts to repair any sort of misattunement or ruptures by this writer were not  expressed in a way (via prosody and gaze) that she could feel. Pt's relational pattern is likely to be understood by her insecure attachment and trauma as a young child in which her innate attachment seeking behavior has been coupled with threat (pain and danger that her needs will be ignored, unmet or invalidated), resulting in a pattern where she is seeking connection but then sabotaging it. Pt does have many strengths, including kindness and intelligence which can be exhibited when pt is regulated. Given the impasse of work with this Probation officer, she was seen for a consultation with Dr. Amedeo Gory.     Outstanding Issues: None. Discussed with Team Leader, Jayme Shorin, LICSW and clinical supervisor, Dr. Vivi Martens who feel given the number of therapists pt has seen at [facility] with no benefit, pt's needs would be better met by returning to Dr. Cristal Ford.     Safe to refill: n/a     Risk level: Low    Plan: Pt to call Dr. Alver Fisher and was given information on private therapist in the community as an alterantive if Dr. Alver Fisher does not have space in her private practice. She will also join a wellness group for women over the age of 39.     For transfers, the treatment plan will now be the responsibility of: not applicable    (Clinician, please route/cc this encounter to your team/program administrative coordinator so that the treatment plan database can be updated.)

## 2018-02-02 NOTE — Progress Notes (Signed)
OUTPATIENT PSYCHIATRY PROGRESS NOTE    INTERPRETER: No    CONTACT INFO FOR OTHER AGENCIES AND MENTAL HEALTH PROVIDERS (IF APPLICABLE): N/a     PROBLEM(S) ADDRESSED IN THIS SESSION: termination, interpersonal relationships, emotional literacy      SUBJECTIVE  TODAY'S CHIEF COMPLAINT AND CLINICAL UPDATES IN PATIENT'S WORDS:  1) Chief Complaint (Patient and/or guardian's own words, concerns and expressed thoughts): "Can you try to understand why I have this difficultly"     2) New information from patient and/or collateral (Patient's illness: context, course, modifying factors, severity, cultural, family, social, medical history): N/A    OBJECTIVE  DATA REVIEWED (Consider medical labs, radiology, other medical tests; screening/outcome measures; psychological testing; discussion of test results with other clinicians; consultation with other clinicians and systems involved with patient, summary of old records): n/a     CURRENT MEDICATIONS (make clear medications prescribed by psychiatry; include OTC medications):  Current Outpatient Medications   Medication Sig   . co-enzyme Q-10 30 MG capsule Take 1 capsule by mouth 3 (three) times daily   . Omega-3 Fatty Acids (FISH OIL) 1000 MG CAPS Take by mouth   . propranolol (INDERAL) 10 MG tablet Take 1 tablet by mouth 3 (three) times daily as needed For rapid heart rate or anxiety symptoms.   . THEANINE PO Take  by mouth.   . Multiple Vitamin (MULTIVITAMINS PO)      No current facility-administered medications for this visit.        MEDICATION ADHERENCE (including barriers and how addressed):N/A    MEDICATION SIDE EFFECTS (Prescribers Only): N/A    BIRTH CONTROL (ask females and males): N/A    CURRENT PREGNANCY: N/A                                    MENTAL STATUS EXAMINATION                     General Appearance: Within normal limits     Interaction with Interviewer (eye contact, attitude, behavior): increased eye contact    Physical Signs    Gait and Station (how patient  walks and stands): Within normal limits                  Physical Appearance: Within normal limits          Normal Movements: Within normal limits         Speech (rate, volume, articulation): variable speed, low volume          Language: Within normal limits                       Mood: tense            Affect: congruent            Thought Process     Rate; concrete vs abstract reasoning: Within normal limits        Logical vs illogical; associations: loose, tangential, circumstantial, intact: Within normal limits                                    Thought Content    Normal Thought Content (other than safety): Within normal limits     Perceptions (auditory, visual, tactile, etc.): Within normal limits       Impulse Control: fair  Cognition (Link to [facility])    Orientation (person, place, time): Within normal limits      Recent and remote memory: Within normal limits           Attention span and concentration: Within normal limits         Fund of knowledge, awareness of current events and vocabulary: Within normal limits      Judgment: Within normal limits          Insight: limited        Suicidality/Homicidality/Aggression (Victimization or Perpetration): Within normal limits     ASSESSMENT   Today's Assessment:  Discussed termination further; pt continued to discuss the often negative impact this writer had, explored her difficulty in dyadic relationships. Is looking forward to group therapy. Provided pt with contact for a therapist in the community whom left TW a message that she likely would have openings in the summer.    Risk Level Assessment  Risk Level Change (if yes, please describe): No    Suicide: low (1) to moderate (2) - feeling increasingly hopeless  Violence: low (1)  Addiction: low (1)        Protective Factors: help seeking.     DIAGNOSES ASSESSED TODAY (psychiatric diagnoses and medical diagnoses that factor into management of psychiatric treatment): DID unspecified ,  PTSD,    CLINICAL  FORMULATION (Make changes as your understanding changes. 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 Pt represents for [facility] services to continue this work and has several strengths including intelligence, sense of humor and persistence.     REVIEWING TODAY'S VISIT  CLINICAL INTERVENTIONS TODAY: Continue to estable therapeutic alliance, increasing emotional literacy, exploring interpersonal styles and boundary setting       PATIENT'S RESPONSE TO INTERVENTIONS: cooperative     PROGRESS TOWARDS GOALS: n/a     TIME SPENT IN PSYCHOTHERAPY: 45 min     PLAN  PLAN FOR MANAGING RISK (Consider risk plan for patients at moderate or high risk for suicide/violence/addiction; medication plan; referrals, etc. Must coincide with treatment plan.): N/A    PLAN FOR ONGOING TREATMENT:  Continue with OP therapy     INFORMED CONSENT (for any new treatment): N/A      ONLY FOR PRESCRIBERS DOING EVALUATION AND MANAGEMENT VISITS     Use only when no psychotherapy performed  COUNSELING AND COORDINATION OF CARE PROVIDED (Consider diagnostic results/impressions and/or recommended studies; risks and benefits of treatment options; instruction for management/treatment and/or follow-up; importance of compliance with chosen treatment options; risk factor reduction; patient/family/caregiver education; prognosis): N/A      Over 50% of time during today's visit was devoted to counseling and/or coordination of care.  If yes, record estimated duration of the face to face  encounter.  N/A    INSTRUCTIONS TO COVERING CLINICIANS:  N/A

## 2018-02-05 ENCOUNTER — Encounter (HOSPITAL_BASED_OUTPATIENT_CLINIC_OR_DEPARTMENT_OTHER): Payer: Self-pay | Admitting: Family Medicine

## 2018-02-16 ENCOUNTER — Telehealth (HOSPITAL_BASED_OUTPATIENT_CLINIC_OR_DEPARTMENT_OTHER): Payer: Self-pay | Admitting: Emergency Medicine

## 2018-02-16 NOTE — Progress Notes (Signed)
Call out to pt to respond to msg she left this Probation officer. Pt not available. Left msg on voicemail.

## 2018-02-17 ENCOUNTER — Ambulatory Visit (HOSPITAL_BASED_OUTPATIENT_CLINIC_OR_DEPARTMENT_OTHER): Payer: Medicare Other | Admitting: Clinical

## 2018-03-03 ENCOUNTER — Ambulatory Visit (HOSPITAL_BASED_OUTPATIENT_CLINIC_OR_DEPARTMENT_OTHER): Payer: Medicare Other | Admitting: Clinical

## 2018-03-10 ENCOUNTER — Ambulatory Visit (HOSPITAL_BASED_OUTPATIENT_CLINIC_OR_DEPARTMENT_OTHER): Payer: Medicare Other | Admitting: Clinical

## 2018-03-17 ENCOUNTER — Ambulatory Visit: Payer: Medicare Other | Attending: Emergency Medicine | Admitting: Clinical

## 2018-03-17 ENCOUNTER — Encounter (HOSPITAL_BASED_OUTPATIENT_CLINIC_OR_DEPARTMENT_OTHER): Payer: Self-pay | Admitting: Family Medicine

## 2018-03-17 DIAGNOSIS — F4310 Post-traumatic stress disorder, unspecified: Secondary | ICD-10-CM

## 2018-03-17 DIAGNOSIS — F331 Major depressive disorder, recurrent, moderate: Secondary | ICD-10-CM | POA: Diagnosis not present

## 2018-03-17 DIAGNOSIS — F449 Dissociative and conversion disorder, unspecified: Secondary | ICD-10-CM | POA: Diagnosis present

## 2018-03-17 NOTE — Progress Notes (Signed)
OUTPATIENT PSYCHIATRY GROUP PROGRESS NOTE    Patient Name: Brianna [name]    Group Name:  "[facility] for Women Over 50"    Leaders:  Floreen Comber, Ed.D. Psychologist     Service Type: [phone] Group Psychotherapy     Length of Group: 24 Min.  Number of participants in group today:  3 (include leader)        Purpose of Group (choose all that apply):   Symptom relief  Affect regulation  Skills training/rehab  Interpersonal skill development  Support (psychological, family, community resources)         Group Process:   Small group today d/t cancellation of one member, No Show of other new member.  Informal introduction of new member and shared information about focus of group to address aging issues and challenges, loss of relationships and coping with health, mental health issues.  Discussed needing to add new members.  Member "not sure" if she will continue, but was welcomed to come back next week, hoping two other members will be present.    Individual Patient Participation:    Active participant  Engaged in topic  Identified with peers  Quiet, but attentive to process    Diagnosis (addressed by this group):  Major Depression, recurrent, moderate PTSD, chronic, Dissociative D/O, Unsped    Medical Necessity of Session (how treatment is necessary to improve symptoms, functioning, or prevent worsening):  Pt is trying to identify new resources for therapy, recently d/c'd from [facility] therapy, exploring finding a new therapy group today, hoping to decrease social isolation and depression.  No risks identified in group today.  Pt endorses having "depression" plus social anxiety re making new friends. upset re losing connections with people, seeking new community resources but ambivalent, "I'm tired of giving to people."     Relevant Changes in Mental Status: No.     Risk Level per Scale:  Suicide: low  Violence: low  Addiction: low    Current risk level represents increase in risk: No.                                                    Dianna Rossetti, PhD                                  03/17/2018  .

## 2018-03-19 ENCOUNTER — Encounter (HOSPITAL_BASED_OUTPATIENT_CLINIC_OR_DEPARTMENT_OTHER): Payer: Self-pay | Admitting: Family Medicine

## 2018-03-23 ENCOUNTER — Encounter (HOSPITAL_BASED_OUTPATIENT_CLINIC_OR_DEPARTMENT_OTHER): Payer: Self-pay | Admitting: Family Medicine

## 2018-03-23 DIAGNOSIS — R4789 Other speech disturbances: Secondary | ICD-10-CM

## 2018-03-24 ENCOUNTER — Ambulatory Visit (HOSPITAL_BASED_OUTPATIENT_CLINIC_OR_DEPARTMENT_OTHER): Payer: Medicare Other | Admitting: Clinical

## 2018-04-07 ENCOUNTER — Ambulatory Visit (HOSPITAL_BASED_OUTPATIENT_CLINIC_OR_DEPARTMENT_OTHER): Payer: Medicare Other | Admitting: Clinical

## 2018-04-08 ENCOUNTER — Encounter (HOSPITAL_BASED_OUTPATIENT_CLINIC_OR_DEPARTMENT_OTHER): Payer: Medicare Other | Admitting: Family Medicine

## 2018-04-14 ENCOUNTER — Ambulatory Visit (HOSPITAL_BASED_OUTPATIENT_CLINIC_OR_DEPARTMENT_OTHER): Payer: Medicare Other | Admitting: Clinical

## 2018-04-21 ENCOUNTER — Ambulatory Visit (HOSPITAL_BASED_OUTPATIENT_CLINIC_OR_DEPARTMENT_OTHER): Payer: Medicare Other | Admitting: Clinical

## 2018-04-21 ENCOUNTER — Ambulatory Visit: Payer: Medicare Other | Attending: Family Medicine | Admitting: Neurology

## 2018-04-21 VITALS — BP 120/69 | HR 58 | Temp 97.7°F | Wt 113.0 lb

## 2018-04-21 DIAGNOSIS — N3941 Urge incontinence: Secondary | ICD-10-CM | POA: Diagnosis present

## 2018-04-21 DIAGNOSIS — F449 Dissociative and conversion disorder, unspecified: Principal | ICD-10-CM

## 2018-04-21 DIAGNOSIS — F4312 Post-traumatic stress disorder, chronic: Secondary | ICD-10-CM

## 2018-04-21 DIAGNOSIS — F331 Major depressive disorder, recurrent, moderate: Secondary | ICD-10-CM | POA: Insufficient documentation

## 2018-04-21 NOTE — Progress Notes (Signed)
Neurology Consultation    Brianna [name] is a 62 year old right handed female, sent for consultation by Joanne Chars, MD for evaluation of  Vocal utterances.    Brianna [name] is here describing symptoms of vocal sounds without intending to. She believes this is due to early childhood trauma, starting over 20 years ago. She found she would say her sisters name, or fathers name or " daddy I hate you", or ask for her mother calling out "mommy mommy".  Some of the things she said are identifying with particular experiences, and others not, but involve family members or are connected to a particular time. She started to have depersonalization at that time: she describes a time when she started to do  mindfulness meditation, and in the process of" looking inside" she started to feel exposed and started to " talk like a baby".  She remembers that she felt compelled to walk around, move her hands and pace the floor.  After this experience, the walking and other symptoms did not recur, but for approximately 20 years she has had these utterances, often of emotional weight, content revolving around early childhood experiences.  She does not have  utterances of grunting, coughing, cursing, sniffing etc. she has not had any grimacing, blinking, facial movements, or other tic movements.  Similar symptoms occurred she describes when she recently started stretching  exercises noting she yelled "daddy".  Without warning this past winter this happened more frequently, but somewhat less now over the summer. Being in bed, showering are also situations that can also somewhat trigger these vocalizations. She has been on medications including Prozac, bupropion, citalopram, but she did not find them helpful for anxiety or depression.  In 1996 she was hospitalized treated with Risperdal but only for 20 days.She denies symptoms of deja-vu, unusual odor, visual or other aura, LOC, alteration in level of arousal, dizziness, vertigo, focal  motor weakness, sensory changes, visual abnormalities.  She sleep poorly, wakens frequently. She does not drink caffeine. She does not use drugs or other supplements.  She is in group therapy, and has seen a number of therapist and psychiatrist, but she has not established long-term relationships with them.  More recently, psychiatry suggested that she try low-dose Prozac, and consider the use of prazosin or clonidine but she is not follow through with these trials.    Past medical history:  Patient Active Problem List:     Post-traumatic stress disorder, chronic     Chronic fatigue     Family history of malignant neoplasm of breast     Screening for unspecified condition     Dissociative disorder or reaction, unspecified     Major depressive disorder, recurrent episode, moderate ([place])     Bunion     Vitamin D deficiency     High myopia, both eyes     Pseudophakia     History of osteopenia     Urge incontinence     Other microscopic hematuria     External hemorrhoids     Not immune to hepatitis B virus     Elevated hemoglobin A1c     Hot flashes      Current Outpatient Medications   Medication Sig   . co-enzyme Q-10 30 MG capsule Take 1 capsule by mouth 3 (three) times daily   . Omega-3 Fatty Acids (FISH OIL) 1000 MG CAPS Take by mouth   . propranolol (INDERAL) 10 MG tablet Take 1 tablet by mouth 3 (three) times daily as  needed For rapid heart rate or anxiety symptoms.   . THEANINE PO Take  by mouth.   . Multiple Vitamin (MULTIVITAMINS PO)      No current facility-administered medications for this visit.        Review of Patient's Allergies indicates:   Penicillins             Hives    Comment:childhood.    Review of patient's family history indicates:  Problem: Mental/Emotional Disorders      Relation: Mother          Age of Onset: (Not Specified)  Problem: Mental/Emotional Disorders      Relation: Father          Age of Onset: (Not Specified)  Problem: Mental/Emotional Disorders      Relation: Sister          Age  of Onset: (Not Specified)  Problem: Cancer - Breast      Relation: Mother          Age of Onset: (Not Specified)          Comment: dx'd at 65. D. age 42.      FH: mother had PSP age 23's. Father " used to call out"-- she thought related to PTSD    SH:  [place], single no children, unemployed.  Previous Control and instrumentation engineer. reports that she has never smoked. She has never used smokeless tobacco. She reports that she does not drink alcohol or use drugs.    ROS: A detailed ROS was discussed with the patient, and is negative other than the HPI.    EXAM: BP 120/69  Pulse 58  Temp 97.7 F (36.5 C) (Oral)  Wt 51.3 kg (113 lb)  LMP  (LMP Unknown)  SpO2 98%  BMI 20.27 kg/m2   General: moderately built and nourished, anxious appearing  Eyes: PERRLA, no icterus  HEENT: atraumatic, normocephalic  Neck: no lymphadenopathy, no carotid bruits  CVS: S1, S2 +, regular rate and rhythm, 1/6 soft murmur heard at the right second intercostal space  Extremities: distal pulses palpable, no cyanosis, clubbing, edema  Skin: no rash, warm, well perfused    Mental status: Awake, alert and oriented Attentive ; provides a detailed history speech fluent, without paraphasias. Comprehension for crossed midline commands intact. o neglect, apraxia or left-right confusion.  Cranial nerves: Pupils 3 mm, equal, round and reactive to light. Extraocular movements full without nystagmus. Visual fields full to confrontation. Facial sensation normal in V1,V2,V3 distributions. Facial strength full and symmetric at rest and volition. Hearing intact to finger rub bilaterally. Palate elevates symmetrically. Tongue protrudes midline. No dysarthria. Shoulder shrug symmetric.  Motor: Normal bulk and tone in all extremities. No adventitious movements. No pronator drift. Strength as follows:    Delt  Bi  Tri  WE  FE  IO  APB   IP   Ham   Quad   TA  Gastr    EHL  R 5     5    5     5     5     5    5       5      5         5         5      5           5   L  5     5    5  5     5     5    5       5      5         5         5      5           5   Reflexes:        B   Tr  Brach  Pat  AJ  Toes  R   2+-----------------------1> Down  L    2+-----------------------1> Down  Sensory: Intact to touch, pinprick,  throughout. Vibratory threshold 12 seconds in the great toes. Proprioception intact in toes.   Movements: no tremor, rigidity or other movements seen.  No tics, mild clonus, or any abnormality seen.  Coordination:  Finger-nose testing without dysmetria or ataxia bilaterally.   Stance and Gait: Gait is steady with normal base, posture and arm-swing. Rhomberg: negative.  Joints: Generally full    LABS AND IMAGING REVIEWED:     ASSESSMENT AND PLAN:     Ms Willison has a past medical history significant for PTSD, dissociative identity disorder, major depression, chronic fatigue, anxiety, urge incontinence. She presents today noting for approximately 20 years, following which she describes as involvement in my from his therapy, the onset of "verbal utterances ".  As noted, she will find herself verbalizing things such as "I love you daddy ", "I need my mommy ", or other regressive simple words or sentences, with content involving childhood experiences.  She says she often states sounds as if she is a toddler baby, and may repeat these phrases.  These occurred during showering, exercising, relaxing and sleeping.  For number of years this resolved, but more recently she has had it happen more frequently, often disturbing periods when she tries to stretch or exercise.  She has not had any motor tics; she has no symptoms of dj vu, loss of consciousness, alteration in level of arousal, motor weakness, vertigo, or any other signs or symptoms suggestive of cerebral ischemia, seizure activity, Tourette's disease, or underlying neurological problem.  She does note dissociation, and different forms throughout her lifetime, severe PTSD and she believes that this is the underlying mechanism.  We had  a long discussion today regarding brain function, and various neurological disorders, as well as the fact that I also believe this is a form of dissociation, during times when she "lets down", when she is relaxed, and when she is less guarded or vigilant.  I encouraged her to follow-up with psychiatry/psychopharmacology.  Suggestions for low-dose Prozac, prazosin and clonidine and other treatment was made, but she has not followed up.  I see she has had a difficult time making therapeutic relationships last, and I discussed this with her as well, suggesting she work to find some when she is comfortable with, and that continuing with therapy, and trial and error with medications that may be helpful to her, would be worth the attempt to deliver more full life.  She does note significant anxiety, depression, and symptoms from PTSD that are very disturbing to her.  We did not set up a follow-up appointment with her.  Great than 50% of today's visit of 60 minutes was spent on direct patient face to face time, discussing the diagnosis, and reviewing treatment plans in detail.    Please feel free to contact me with any questions or concerns.     Verdene Lennert MD

## 2018-04-28 ENCOUNTER — Ambulatory Visit (HOSPITAL_BASED_OUTPATIENT_CLINIC_OR_DEPARTMENT_OTHER): Payer: Medicare Other | Admitting: Clinical

## 2018-05-05 ENCOUNTER — Ambulatory Visit (HOSPITAL_BASED_OUTPATIENT_CLINIC_OR_DEPARTMENT_OTHER): Payer: Medicare Other | Admitting: Clinical

## 2018-05-10 ENCOUNTER — Ambulatory Visit (HOSPITAL_BASED_OUTPATIENT_CLINIC_OR_DEPARTMENT_OTHER): Payer: Self-pay

## 2018-05-10 NOTE — Dental Note (Addendum)
Medical Alert: Allergies    Mental Disorders    Other    Vitamin Deficiency    Depression  Medications: Other    Propranolol    Clindamycin 300 mg  Allergies:      Penicillin  Since Last Visit: Medical Alert: No Change    Medications: No Change    Allergies:        No Change  Pain Scale Type: Numeric Pain Scale Pain Level: 0  Description:    Patient presented for a hygiene visit.    Interpreter: None    Patient pain level at beginning of appointment: 0    *Medical/Isabel Ardila History*  Medical history: No change    *Radiographs*  Radiographs taken: None  Radiographs taken to evaluate for: NA    *Hygiene Observations*  - Overall Oral Hygiene:Poor  - Plaque: Moderate to Heavy  - Staining: Medium  - Calculus: Medium  - Food Impaction: Medium  - Bleeding: Light  - Gingival Attachment: Recession  - Periodontal description: Generalized fibrotic tissue  - Periodontal probing: N/A  - Perio classification: II-Chronic Periodontitis, generalized, moderate  severity  -Home Care: Poor  -Intraoral exam: Dry mouth    *Treatment*  Services provided: Prophy and Oral hygiene Instruction.  Prophylaxis today consisted of hand scale, prophy cup polish, Cavitron and  flossing. Proper technique of brushing demonstrated.  Flossing with fingers   and   a floss holder demonstrated.  Patient given a soft toothbrush and floss.    *Assessment & Plan*  Patient was not examined by dentist.  Assessment/Plan: NA    Patient pain level at end of appointment: 0    Patient left ambulatory, satisfied with treatment. Any remaining planned  treatments were explained in detail to the patient.    Hygiene Recare 4 month interval    Next Visit: see above  *Instructions for Receptionist*  Procedure:  Provider:  When:    ----- Signed on Monday, May 10, 2018 at 4:51:16 PM  -----  ----- Provider: [facility], [facility]: [facility] -----      ----- Appended on Monday, May 10, 2018 at 4:51:56 PM  -----  ----- Provider: Mal Amabile, [facility] -- Clinic: [facility] -----  NV: recall

## 2018-05-11 ENCOUNTER — Encounter (HOSPITAL_BASED_OUTPATIENT_CLINIC_OR_DEPARTMENT_OTHER): Payer: Self-pay | Admitting: Family Medicine

## 2018-05-11 ENCOUNTER — Ambulatory Visit: Payer: No Typology Code available for payment source | Attending: Family Medicine | Admitting: Family Medicine

## 2018-05-11 VITALS — BP 106/64 | HR 78 | Temp 98.2°F | Wt 116.0 lb

## 2018-05-11 DIAGNOSIS — M79672 Pain in left foot: Secondary | ICD-10-CM | POA: Diagnosis not present

## 2018-05-11 DIAGNOSIS — G8929 Other chronic pain: Secondary | ICD-10-CM | POA: Diagnosis present

## 2018-05-11 DIAGNOSIS — F331 Major depressive disorder, recurrent, moderate: Secondary | ICD-10-CM | POA: Diagnosis present

## 2018-05-11 DIAGNOSIS — R209 Unspecified disturbances of skin sensation: Secondary | ICD-10-CM | POA: Diagnosis present

## 2018-05-11 DIAGNOSIS — N952 Postmenopausal atrophic vaginitis: Secondary | ICD-10-CM | POA: Diagnosis present

## 2018-05-11 DIAGNOSIS — M25562 Pain in left knee: Secondary | ICD-10-CM | POA: Diagnosis present

## 2018-05-11 DIAGNOSIS — F5109 Other insomnia not due to a substance or known physiological condition: Secondary | ICD-10-CM | POA: Diagnosis present

## 2018-05-11 DIAGNOSIS — M25561 Pain in right knee: Secondary | ICD-10-CM | POA: Diagnosis present

## 2018-05-11 DIAGNOSIS — M25552 Pain in left hip: Secondary | ICD-10-CM

## 2018-05-11 DIAGNOSIS — D1801 Hemangioma of skin and subcutaneous tissue: Secondary | ICD-10-CM

## 2018-05-11 DIAGNOSIS — M79671 Pain in right foot: Secondary | ICD-10-CM

## 2018-05-11 DIAGNOSIS — M21619 Bunion of unspecified foot: Secondary | ICD-10-CM | POA: Insufficient documentation

## 2018-05-11 DIAGNOSIS — J3489 Other specified disorders of nose and nasal sinuses: Secondary | ICD-10-CM

## 2018-05-11 DIAGNOSIS — M25551 Pain in right hip: Secondary | ICD-10-CM | POA: Diagnosis present

## 2018-05-11 MED ORDER — FLUTICASONE PROPIONATE 50 MCG/ACT NA SUSP
1.00 | Freq: Every day | NASAL | 12 refills | Status: AC
Start: 2018-05-11 — End: 2018-08-09

## 2018-05-11 MED ORDER — FLUTICASONE PROPIONATE 50 MCG/ACT NA SUSP: 1 | Bottle | Freq: Every day | NASAL | 12 refills | 0 days | Status: AC

## 2018-05-11 MED ORDER — ESTRADIOL 0.1 MG/GM VA CREA
1.00 g | TOPICAL_CREAM | VAGINAL | 1 refills | Status: AC
Start: 2018-05-12 — End: 2019-05-12

## 2018-05-11 MED ORDER — ESTRADIOL 0.1 MG/GM VA CREA: 1 g | tube | VAGINAL | 1 refills | 0 days | Status: AC

## 2018-05-11 NOTE — Progress Notes (Signed)
SUBJECTIVE:   Brianna [name] is a 62 year old female with the following Problems and Medications.    Patient Active Problem List:     Post-traumatic stress disorder, chronic     Chronic fatigue     Family history of malignant neoplasm of breast     Screening for unspecified condition     Dissociative disorder or reaction, unspecified     Major depressive disorder, recurrent episode, moderate (HCC)     Bunion     Vitamin D deficiency     High myopia, both eyes     Pseudophakia     History of osteopenia     Urge incontinence     Other microscopic hematuria     External hemorrhoids     Not immune to hepatitis B virus     Elevated hemoglobin A1c     Hot flashes    Current Outpatient Medications   Medication Sig   . fluticasone (FLONASE) 50 MCG/ACT nasal spray 1 spray by Each Nostril route daily for allergies   . [START ON 05/12/2018] estradiol (ESTRACE VAGINAL) 0.1 MG/GM vaginal cream Place 1 g vaginally 3 (three) times a week   . co-enzyme Q-10 30 MG capsule Take 1 capsule by mouth 3 (three) times daily   . Omega-3 Fatty Acids (FISH OIL) 1000 MG CAPS Take by mouth   . propranolol (INDERAL) 10 MG tablet Take 1 tablet by mouth 3 (three) times daily as needed For rapid heart rate or anxiety symptoms.   . THEANINE PO Take  by mouth.   . Multiple Vitamin (MULTIVITAMINS PO)      No current facility-administered medications for this visit.      Review of Patient's Allergies indicates:   Penicillins             Hives    Comment:childhood.    Here for an array of concerns.      Emotionally feeling a little better, which she attributes to getting outside and walking more often.  Her group series had been canceled and she does not plan to go back.  Currently contemplating a move to [place] where the climate is better and she can walk for more of the year.  But still not sleeping well.  Getting 6 hour of sleep a night due to early awakening - sleep 10-11, up at around 4.  Used to sleep only 4 hours with some improvement but she  feels continuously tired.  In the past melatonin did not really help.  Has been working with therapists in the past and not currently as well.  Maybe interseted in meds but not sure now.  We reviewed some of the possible risks including morning drowsiness, she would like to think it over and let me know.  Should she opt for this treatment I would probably recommend doxepin or trazodone    Feels like taste is a little better, though does sometimes have scant bloody nasal discharge.  She would like to continue treating and needs new rx for flonase.    Has some bumps on skin, not sure where they're coming from.  Behind right ear, in the mid chest, a few on her abdomen.  Thinks there might be as many as a dozen although she is only identified these 5.  Not itchy, not painful.  HAve ome and stayed, thinks has about a dozen, over about the last month.      Bilateral dorsal foot pain, great toe stiff on both sides.  No recent change in footwear though has been walking more, would like referral back to podiatry.    Hip, knee pain relatively new - hurts more with standing and walking which is been important to supporting her mood.    Having more fecal urgency, not related to meals, not all the time, no clear triggers, no diarrhea just soft.  No clear provocation.  Little caffine intake. Not sure if any food correlation.  Will feel an urge to defecate and be barely able to make it to the bathroom.  Usually happens at home, and she has had very small amounts of fecal incontinence because she was not able to get to the bathroom fast enough    Cold hands and top of ears.  No color changes.  Not painful.  Wonders if there is a treatment available    Wonders about selenium deficiency that might explain her fatigue and ongoing hair loss    Worried about pap and pain with atrophic vaginitis, in the past worked with a PCP who gave her estrogen cream for a couple of months before.  She would like to do this again.    OBJECTIVE: She  appears well, in no apparent distress.  Alert and oriented times three, pleasant and cooperative. Vital signs are as noted by the nurse. BP 106/64 (Site: RA, Position: Sitting, Cuff Size: Reg)  Pulse 78  Temp 98.2 F (36.8 C) (Temporal)  Wt 52.6 kg (116 lb)  LMP  (LMP Unknown)  SpO2 96%  BMI 20.81 kg/m2 Most Recent Weight Reading(s)  05/11/18 : 52.6 kg (116 lb)  04/21/18 : 51.3 kg (113 lb)  12/17/17 : 52.2 kg (115 lb)  Skin exam behind her right ear is an approximately 2 mm papule which appears to be probably a tiny sebaceous cyst.  Her mid chest is a similar 1 to 2 mm papule which could be another small sebaceous cyst or an intradermal nevus.  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    Hands today are warm and well perfused with normal capillary refill  Gait is normal.  Bilateral feet with some evidence of first metatarsophalangeal joint change however no other gross abnormalities noted, there is some tenderness over the lateral dorsal foot near the lateral malleolus bilaterally with some subtle swelling however no point tenderness.    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  In the past did not find melatonin helpful.  Reviewed the risks and benefits of use of sleep aids, at this time she is not wanting to try anything.  We did not have the time to deeply review sleep hygiene however we did talk about avoidance of caffeine late in the day  Plan: She will continue to work with her mental health provider teams, will let me know if she wants to try medication    (J34.89) Rhinorrhea  Comment: Continues to have some runny nose she wondered if it was caused by Flonase however I suspect it is not.  She has enjoyed an improvement in her sense of taste so we will continue this treatment  Plan: fluticasone (FLONASE) 50 MCG/ACT nasal spray        Reviewed possible  side effects of epistaxis    (M79.671,  M79.672) Foot pain, bilateral  Comment: I suspect that she is having mechanical pain from her foot where an increase in walking.  Since she also has a bunion would like to see podiatry.  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  She will let me know if she has further concerns    (M25.561,  M25.562,  G89.29) Chronic pain of both knees  Comment: While there is insufficient time to do a complete exam of her hips and knees this discomfort is coincident with increased walking.  May well benefit from work on her posture  See above  Plan: REFERRAL TO PHYSICAL THERAPY ( INT)        She will let me know how it goes    (M25.551,  M25.552,  G89.29) Chronic pain of both hips  Comment: As above  Plan:     (N95.2) Atrophic vaginitis  Comment: Long history of vaginal atrophy hopefully some vaginal estrogen will make her next Pap smear easier reviewed appropriate  Plan: estradiol (ESTRACE VAGINAL) 0.1 MG/GM vaginal         cream        Reviewed appropriate use    (F33.1) Major depressive disorder, recurrent episode, moderate (HCC)  Comment: She reports feeling somewhat better and is still engaged in mental health services at this time.  Exercise has been very important for her to feel better  PlanEncouraged ongoing exercise and try to stay engaged with her therapists    (M21.619) Bunion  Comment: Requests referral to podiatry  Plan:     (R20.9) Cold hands  Comment: She notes her hands and tops of her ears are often cold during the day and at night.  This happens indoors as well as out.  She denies any color changes pain or numbness.  Reviewed that this is probably her normal physiology and the best treatment is avoidance through appropriate dress, consider keeping a heating pad or warm beverage  around to warm her hands.  Plan: She will call should symptoms worsen

## 2018-05-12 ENCOUNTER — Ambulatory Visit (HOSPITAL_BASED_OUTPATIENT_CLINIC_OR_DEPARTMENT_OTHER): Payer: Medicare Other | Admitting: Clinical

## 2018-05-19 ENCOUNTER — Ambulatory Visit (HOSPITAL_BASED_OUTPATIENT_CLINIC_OR_DEPARTMENT_OTHER): Payer: Medicare Other | Admitting: Clinical

## 2018-05-20 ENCOUNTER — Ambulatory Visit (HOSPITAL_BASED_OUTPATIENT_CLINIC_OR_DEPARTMENT_OTHER)
Admit: 2018-05-20 | Discharge: 2018-05-20 | Disposition: A | Discharge status: Home / Self Care | Payer: No Typology Code available for payment source

## 2018-05-20 ENCOUNTER — Ambulatory Visit
Admission: RE | Admit: 2018-05-20 | Discharge: 2018-05-20 | Disposition: A | Discharge status: Home / Self Care | Payer: No Typology Code available for payment source | Attending: Foot & Ankle Surgery | Admitting: Foot & Ankle Surgery

## 2018-05-20 ENCOUNTER — Ambulatory Visit (HOSPITAL_BASED_OUTPATIENT_CLINIC_OR_DEPARTMENT_OTHER): Payer: No Typology Code available for payment source | Admitting: Foot & Ankle Surgery

## 2018-05-20 DIAGNOSIS — M25472 Effusion, left ankle: Principal | ICD-10-CM

## 2018-05-20 DIAGNOSIS — M79671 Pain in right foot: Secondary | ICD-10-CM | POA: Diagnosis present

## 2018-05-20 DIAGNOSIS — M205X1 Other deformities of toe(s) (acquired), right foot: Secondary | ICD-10-CM | POA: Diagnosis not present

## 2018-05-20 DIAGNOSIS — M19072 Primary osteoarthritis, left ankle and foot: Secondary | ICD-10-CM

## 2018-05-20 DIAGNOSIS — M19071 Primary osteoarthritis, right ankle and foot: Secondary | ICD-10-CM | POA: Diagnosis not present

## 2018-05-20 DIAGNOSIS — M205X2 Other deformities of toe(s) (acquired), left foot: Secondary | ICD-10-CM | POA: Diagnosis not present

## 2018-05-20 DIAGNOSIS — M7731 Calcaneal spur, right foot: Secondary | ICD-10-CM

## 2018-05-20 DIAGNOSIS — M79672 Pain in left foot: Secondary | ICD-10-CM

## 2018-05-20 DIAGNOSIS — M205X9 Other deformities of toe(s) (acquired), unspecified foot: Secondary | ICD-10-CM

## 2018-05-20 DIAGNOSIS — M7732 Calcaneal spur, left foot: Secondary | ICD-10-CM | POA: Insufficient documentation

## 2018-05-20 MED ORDER — DICLOFENAC SODIUM 1 % TD GEL: 4 g | g | Freq: Four times a day (QID) | 2 refills | 0 days | Status: AC

## 2018-05-20 MED ORDER — DICLOFENAC SODIUM 1 % TD GEL
4.00 g | Freq: Four times a day (QID) | TRANSDERMAL | 2 refills | Status: AC
Start: 2018-05-20 — End: 2018-08-18

## 2018-05-20 NOTE — Progress Notes (Signed)
HPI  Brianna [name] is a 62 year old  female who presents to clinic for bilateral chronic midfoot pain for several months, bilateral chronic great toe joint pain for the past few years, and more recent left ankle pain and swelling over the past week. She does recently recall a tripping injury to her left ankle last week which she believes may have caused the left ankle pain and swelling but is unclear about this injury. She states the pain in her left ankle has been consistent for the past two days but the pain in her midfoot and great toe joints has been intermittent in nature. The pain is dull and "achy" to the midfoot and sharp to the great toe joints and left ankle when present.     Review of Systems:  Constitutional: Denies any nausea, vomiting, fever, chills, shortness of breath or chest pain.   MSK: Endorses left ankle pain, bilateral great toe joint pain, and bilateral midfoot pain. Endorses bilateral hip pain. Denies knee and lower back pain. Endorses left ankle swelling. Endorses stiffness of great toe joints bilaterally. Denies erythema of joints.  Integument: Denies lesions, rashes, or new bumps on skin.   Neuro: Denies weakness, paralysis, numbness, tingling, and burning.      PAST MEDICAL HISTORY  Past Medical History:  No date: Anxiety  No date: Astigmatism  No date: Cataract  BMD 8/04: Disorder of bone and cartilage, unspecified      Comment:  Osteopenia only, femoral neck.  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 MAH: EGD,                Cscopy 09/09/06: all normal. 2/08: rslvd.  No date: Major depressive disorder, single episode, moderate ([place])  08/11/2002: Menopause  No date: Myopia  No date: Presbyopia  No date: Thyroid disease  No date: Wears eyeglasses    PAST SURGICAL HISTORY  Past Surgical History:  No date: CATARACT REMOVAL INSERTION OF LENS  No date: EXCISION TONSIL TAGS  No date: IMPLANT MESH OPN HERNIA RPR/DEBRIDEMENT CLOSURE       Comment:  inguinal hernia    MEDICATIONS   [place], [place] Medication Instructions ZOX:[id number]    Printed on:05/20/18 1309   Medication Information                      co-enzyme Q-10 30 MG capsule  Take 1 capsule by mouth 3 (three) times daily             estradiol (ESTRACE VAGINAL) 0.1 MG/GM vaginal cream  Place 1 g vaginally 3 (three) times a week             fluticasone (FLONASE) 50 MCG/ACT nasal spray  1 spray by Each Nostril route daily for allergies             Multiple Vitamin (MULTIVITAMINS PO)               Omega-3 Fatty Acids (FISH OIL) 1000 MG CAPS  Take by mouth             propranolol (INDERAL) 10 MG tablet  Take 1 tablet by mouth 3 (three) times daily as needed For rapid heart rate or anxiety symptoms.             THEANINE PO  Take  by mouth.  ALLERGIES  Review of Patient's Allergies indicates:   Penicillins             Hives    Comment:childhood.    SOCIAL HISTORY  Social History    Tobacco Use      Smoking status: Never Smoker      Smokeless tobacco: Never Used    Alcohol use: No      Alcohol/week: 0.2 oz    Drug use: No        FAMILY HISTORY  Review of patient's family history indicates:  Problem: Mental/Emotional Disorders      Relation: Mother          Age of Onset: (Not Specified)  Problem: Mental/Emotional Disorders      Relation: Father          Age of Onset: (Not Specified)  Problem: Mental/Emotional Disorders      Relation: Sister          Age of Onset: (Not Specified)  Problem: Cancer - Breast      Relation: Mother          Age of Onset: (Not Specified)          Comment: dx'd at 69. D. age 45.      OBJECTIVE:  General: AAOx3. NAD. Pleasant  Vascular: DP and PT pulses palpable, +1/4 b/l. Capillary fill time brisk to digits 1-5 b/l. No increase in skin temperature gradient. There is pitting edema to the left ankle +1.   Neurologic: Epicritic sensation intact b/l.  Dermatologic: Webspaces 1-4 were clean, dry and intact b/l. No open lesions noted. No erythema or  edema noted. No crepitus or fluctuance noted.    Orthopedic: Positive TTP to bilateral midfoot over the second and third tarsometatarsal joints as well as left medial ankle with compression.  There is limited first metatarsophalangeal joint range of motion bilaterally with crepitus noted on endrange dorsiflexion and plantarflexion, with more limitation and pain on range of motion on the right than the left.  Muscle strength was +5/5 for all dorsiflexors, plantarflexors, invertors and everters b/l. All muscles and tendons actively firing. Ankle joint range of motion was decreased with the knee extended and flexed with no pain or crepitus b/l. Subtalar joint and midtarsal joint range of motion was intact with no pain or crepitus b/l.      Imaging:  Weightbearing x-rays of the left ankle demonstrate no increasing soft tissue density or osseous abnormalities.  Ankle joint and subtalar joint appear congruent on lateral weightbearing view.    Weightbearing x-rays of the left foot demonstrate joint space narrowing of the first metatarsophalangeal joint with mild dorsal osteophytic lipping as well as second and third tarsometatarsal joints without significant subchondral sclerosis or osteophyte formation.    Weightbearing x-rays of the right foot demonstrate joint space narrowing of the first metatarsophalangeal joint with flattening of the first metatarsal head and dorsal osteophytic lipping as well as elevation of the first ray relative to the lesser rays.  There is joint space narrowing without subchondral sclerosis or osteophyte formation of the second and third tarsometatarsal joint.    ASSESSMENT:  Structural hallux limitus, bilateral  Metatarsus primus elevatus, right foot  Midfoot arthritis bilateral  Left ankle swelling    PLAN:  It does appear that the principal source of patient's deformity is at the level of the first metatarsophalangeal joint bilaterally, more so on the right than the left.  She does  additionally have mild signs of tarsometatarsal arthritis.  She  was given multiple options, including nonsteroidal anti-inflammatories, offloading with arch supports, and surgical correction to include cheilectomy or fusion most likely.  She wants to avoid surgical correction at this time, so she was given structural orthotics to prevent further exacerbation of tarsometatarsal joint arthritis and first metatarsophalangeal joint arthritis.  She was also given a prescription for Voltaren gel to apply as needed for local analgesic effect.  She does not have any osseous abnormality at the left ankle but does have soft tissue swelling with pitting edema at this location.  For this, she was given a pair of compression stockings.  She can follow-up as needed for further pedal complaints.

## 2018-05-21 NOTE — Progress Notes (Signed)
ATTENDING NOTE    I have personally seen and examined this patient. I have fully participated in the care of this patient. I have reviewed and agree with all pertinent clinical information including history, physical exam, diagnostics and the plan. I have also reviewed and agree with the medications, allergies and past medical history sections for this patient.

## 2018-05-26 ENCOUNTER — Ambulatory Visit (HOSPITAL_BASED_OUTPATIENT_CLINIC_OR_DEPARTMENT_OTHER): Payer: Medicare Other | Admitting: Clinical

## 2018-06-02 ENCOUNTER — Ambulatory Visit (HOSPITAL_BASED_OUTPATIENT_CLINIC_OR_DEPARTMENT_OTHER): Payer: Medicare Other | Admitting: Clinical

## 2018-06-08 ENCOUNTER — Ambulatory Visit (HOSPITAL_BASED_OUTPATIENT_CLINIC_OR_DEPARTMENT_OTHER): Payer: Medicare Other | Admitting: Rehabilitative and Restorative Service Providers"

## 2018-06-08 NOTE — Progress Notes (Deleted)
OUTPATIENT PHYSICAL THERAPY EVALUATION    Referring Provider: Joanne Chars, MD    Precautions: ***    SUBJECTIVE  Hx of Present Illness: Pt is a 62 year old female who presents to physical therapy with a physician diagnosis of chronic pain of both knees. ***    Mechanism and Date of Injury: ***    Date of Surgery: ***    Imaging: none for current episode    Prior Level of Function: ***    Occupation: ***    Pain Level: ***    Dressing/Grooming: {REHAB_GENERAL LIST:12845::"WNL"}    Driving: {REHAB_GENERAL LIST:12845::"WNL"}    Sleeping: {REHAB_GENERAL LIST:12845::"WNL"}    Aggravating Factors: ***    Alleviating Factors: ***    Patient Goal for PT: ***    Previous Treatment: ***    Past Medical History: Patient Active Problem List:     Post-traumatic stress disorder, chronic     Chronic fatigue     Family history of malignant neoplasm of breast     Screening for unspecified condition     Dissociative disorder or reaction, unspecified     Major depressive disorder, recurrent episode, moderate ([place])     Bunion     Vitamin D deficiency     High myopia, both eyes     Pseudophakia     History of osteopenia     Urge incontinence     Other microscopic hematuria     External hemorrhoids     Not immune to hepatitis B virus     Elevated hemoglobin A1c     Hot flashes      Medications: (Rx Comments, concerns): For a list of current medications review the Medication activity.     Mental Status/Communication: {REHAB_MENTAL STATUS:12835}    Learns Best: ***      OBJECTIVE  Neurological: {REHAB_GENERAL LIST:12845::"WNL"}  Inspection: ***  Skin/Integrity: ***    Palpation: ***  Posture/Alignment: ***  Pelvic Alignment: ***  Gait: ***  Stair Negotiation: ***  Balance: ***  Joint Mobility: ***  Core Strength: ***  Double Leg Squat: ***  Single Leg Squat: ***  GIRTH LEFT RIGHT GIRTH LEFT RIGHT                       ROM/STRENGTH   A/PROM  MMT    LEFT RIGHT  LEFT RIGHT   GROSS ROM   GROSS MMT     HIP   HIP     FLEX.   FLEX     EXT.    GLUT MAX     ABD   ABD     ER   ER     IR   IR     ADD   ADD     KNEE   KNEE     FLEX   HS     EXT   QUADS     ANKLE   ANKLE     DF   ANT. TIB     PF   GASTROC     INV   INV     EV   EV     1st MTP EXT   1st MTP EXT         SPECIAL TEST LEFT RIGHT MUSCLE LENGTH LEFT RIGHT      Hamstring Length        Quad Length        [employer]  Gastroc Length               SPECIAL TESTS  Straight Leg Raise: ***  Well's Leg Raise: ***  Valgus Stress Test: ***  Varus Stress Test: ***  McMurray's Test: ***  Thessaly's Test: ***  Apley's Test: ***  Anterior Drawer: ***  Lever Test: ***  Posterior Drawer: ***  Lachman's Test: ***  Double Leg Squat: ***  Single Leg Squat: ***  Single limb stance: ***  Patellar Apprehension Test: ***  FABER Test: ***  Hip Scouring Test: ***  FAIR Test: ***  Leg Length Discrepancy: ***  Capsular Pattern: ***  SI JOINT TESTING  [place]: ***  FABER Test: ***  Thigh Thrust/Femoral Shear: ***  Ant Compression/Post Gapping: ***  Post Compression/Ant Gapping: ***  Leg Length Discrepancy: ***    Objective Deficits: ***  Functional Deficits:***      Physical Therapy Plan of Care    MD: Joanne Chars, MD  Referring Provider: Joanne Chars, MD  Diagnosis: No diagnosis found.    Assessment/Objective Findings: Patient is a 62 year old female who reports to physical therapy with ***. Signs and symptoms most consistent with ***. She presents today with {LIMITATIONS}; these deficits contribute to difficulty {FUNCTIONAL DEFICITS}. Pt also has a history of {COMORBIDITIES} which were taken into consideration formulating PT Plan of Care. Patient expresses long term goal of {LONG TERM GOALS} and is motivated to work towards this in PT. She was educated on HEP, role of PT, prognosis, POC***. She will benefit from skilled physical therapy to address these limitations and return to prior level of function.    Short Term Rehabilitation Goals:   improved *** ROM to *** in *** weeks   decreased  girth/swelling to *** in *** weeks   improved *** SLS to *** in *** weeks   improved *** mobility to *** in *** weeks   improved *** strength by 1/3 grade in *** weeks   improved *** length to *** in *** weeks  improved gait *** by *** in *** weeks   improved core strength by 1/3 grade in *** weeks     improved squat mechanics with *** by *** in *** weeks      Long Term Rehabilitation Goals:  Pt will demonstrate ability to ambulate > *** minutes with min to no pain or difficulty in *** weeks  Pt will demonstrate ability to negotiate stairs with min to no pain or difficulty in *** weeks  Pt will demonstrate ability to perform all transfers without difficulty in *** weeks  Pt will demonstrate ***   Pt will demonstrate ***     Treatment Plan: {OT/PT TREATMENT PLAN:12724}    Recommend Physical Therapy be continued {NUMBERS 1-6:6} times per week for {NUMBERS 1-12:10} weeks.  The rehabilitation potential for this patient is {EXCELLENT/GOOD/FAIR/POOR:12734}. Clinical presentation is *** due to *** (PMH, past non compliance, high and changing pain levels, etc) ***    Patient Regis Bill is aware of attendance policy: {HER/DE/YC:14481::"EHU"}  Plan of care discussed with Patient/Family: {Yes/No/NA:10764::"Yes"}  Patient goals reviewed and incorporated in plan of care: {Yes/No/NA:10764::"Yes"}  Patient/Family agrees with plan of care: {Yes/No/NA:10764::"Yes"}  Patient/Family education: {Yes/No/NA:10764::"Yes"}  Does patient feel safe at home: {yes DJ:[id number]}      Barrie Dunker, PT, Lic # [id number]

## 2018-06-09 ENCOUNTER — Ambulatory Visit (HOSPITAL_BASED_OUTPATIENT_CLINIC_OR_DEPARTMENT_OTHER): Payer: Medicare Other | Admitting: Clinical

## 2018-06-15 ENCOUNTER — Encounter (HOSPITAL_BASED_OUTPATIENT_CLINIC_OR_DEPARTMENT_OTHER): Payer: Self-pay | Admitting: Psychiatry

## 2018-06-16 ENCOUNTER — Ambulatory Visit (HOSPITAL_BASED_OUTPATIENT_CLINIC_OR_DEPARTMENT_OTHER): Payer: Medicare Other | Admitting: Clinical

## 2018-06-23 ENCOUNTER — Encounter (HOSPITAL_BASED_OUTPATIENT_CLINIC_OR_DEPARTMENT_OTHER): Payer: Medicare Other | Admitting: Neurology

## 2018-07-05 ENCOUNTER — Encounter (HOSPITAL_BASED_OUTPATIENT_CLINIC_OR_DEPARTMENT_OTHER): Payer: Self-pay | Admitting: Family Medicine

## 2018-07-06 ENCOUNTER — Encounter (HOSPITAL_BASED_OUTPATIENT_CLINIC_OR_DEPARTMENT_OTHER): Payer: Self-pay | Admitting: Family Medicine

## 2018-07-06 MED ORDER — ESTROGENS, CONJUGATED 0.625 MG/GM VA CREA
1.50 g | TOPICAL_CREAM | Freq: Every evening | VAGINAL | 1 refills | Status: AC
Start: 2018-07-06 — End: 2018-09-05

## 2018-07-06 MED ORDER — ESTROGENS, CONJUGATED 0.625 MG/GM VA CREA: 2 g | g | Freq: Every evening | VAGINAL | 1 refills | 0 days | Status: AC

## 2018-07-09 ENCOUNTER — Encounter (HOSPITAL_BASED_OUTPATIENT_CLINIC_OR_DEPARTMENT_OTHER): Payer: Self-pay | Admitting: Family Medicine

## 2018-07-11 ENCOUNTER — Encounter (HOSPITAL_BASED_OUTPATIENT_CLINIC_OR_DEPARTMENT_OTHER): Payer: Self-pay | Admitting: Family Medicine

## 2018-07-27 ENCOUNTER — Encounter (HOSPITAL_BASED_OUTPATIENT_CLINIC_OR_DEPARTMENT_OTHER): Payer: Self-pay | Admitting: Family Medicine

## 2018-08-09 ENCOUNTER — Ambulatory Visit: Payer: No Typology Code available for payment source | Attending: Family Medicine | Admitting: Family Medicine

## 2018-08-09 ENCOUNTER — Encounter (HOSPITAL_BASED_OUTPATIENT_CLINIC_OR_DEPARTMENT_OTHER): Payer: Self-pay | Admitting: Family Medicine

## 2018-08-09 VITALS — BP 100/60 | HR 72 | Temp 98.8°F

## 2018-08-09 DIAGNOSIS — Z01419 Encounter for gynecological examination (general) (routine) without abnormal findings: Principal | ICD-10-CM

## 2018-08-09 DIAGNOSIS — Z124 Encounter for screening for malignant neoplasm of cervix: Secondary | ICD-10-CM

## 2018-08-09 NOTE — Progress Notes (Signed)
62 year old woman here for pelvic part of annual exam. She has seen her primary care provider for an annual exam in the last year.     OB History   G0  P0  T0  P0  A0  L0    SAB0  TAB0  Ectopic0  Molar0  Multiple0  Live Births0      CC/HPI: no gyn concerns.  Has been using vaginal estrogen for last 2 weeks as in past had very painful pap smears.    GYN History:  menopausal since age 26 with no bleeding since that time, and no bothersome vaginal discharge.  Sexual History: not currently sexually active  Past GYN History: no change since last visit    Significant OB History: none    Current Outpatient Medications   Medication Sig   . fluticasone (FLONASE) 50 MCG/ACT nasal spray 1 spray by Each Nostril route daily for allergies   . co-enzyme Q-10 30 MG capsule Take 1 capsule by mouth 3 (three) times daily   . Omega-3 Fatty Acids (FISH OIL) 1000 MG CAPS Take by mouth   . propranolol (INDERAL) 10 MG tablet Take 1 tablet by mouth 3 (three) times daily as needed For rapid heart rate or anxiety symptoms.   . THEANINE PO Take  by mouth.   . Multiple Vitamin (MULTIVITAMINS PO)    . estrogen, conjugated, (PREMARIN) 0.625 MG/GM vaginal cream Place 1.5 g vaginally nightly For 3 weeks; then use as needed (once or twice a week).   [name] diclofenac (VOLTAREN) 1 % GEL Gel Apply 4 g topically 4 (four) times daily Do Not Exceed 32 g in 24 Hours   . estradiol (ESTRACE VAGINAL) 0.1 MG/GM vaginal cream Place 1 g vaginally 3 (three) times a week     No current facility-administered medications for this visit.        Penicillins    Patient Active Problem List:     Post-traumatic stress disorder, chronic     Chronic fatigue     Family history of malignant neoplasm of breast     Screening for unspecified condition     Dissociative disorder or reaction, unspecified     Major depressive disorder, recurrent episode, moderate (HCC)     Bunion     Vitamin D deficiency     High myopia, both eyes     Pseudophakia     History of osteopenia     Urge  incontinence     Other microscopic hematuria     External hemorrhoids     Not immune to hepatitis B virus     Elevated hemoglobin A1c     Hot flashes      Past Surgical History:  No date: CATARACT REMOVAL INSERTION OF LENS  No date: EXCISION TONSIL TAGS  No date: IMPLANT MESH OPN HERNIA RPR/DEBRIDEMENT CLOSURE      Comment:  inguinal hernia    Social History     Socioeconomic History   . Marital status: Single     Spouse name: Not on file   . Number of children: 0   . Years of education: Not on file   . Highest education level: Not on file   Occupational History   . Occupation: disability     Employer: NONE   Social Needs   . Financial resource strain: Not on file   . Food insecurity:     Worry: Not on file     Inability: Not on file   .  Transportation needs:     Medical: Not on file     Non-medical: Not on file   Tobacco Use   . Smoking status: Never Smoker   . Smokeless tobacco: Never Used   Substance and Sexual Activity   . Alcohol use: No     Alcohol/week: 0.3 standard drinks   . Drug use: No   . Sexual activity: Not Currently     Partners: Male     Comment: not since '05.   Lifestyle   . Physical activity:     Days per week: Not on file     Minutes per session: Not on file   . Stress: Not on file   Relationships   . Social connections:     Talks on phone: Not on file     Gets together: Not on file     Attends religious service: Not on file     Active member of club or organization: Not on file     Attends meetings of clubs or organizations: Not on file     Relationship status: Not on file   . Intimate partner violence:     Fear of current or ex partner: Not on file     Emotionally abused: Not on file     Physically abused: Not on file     Forced sexual activity: Not on file   Other Topics Concern   . Not on file   Social History Narrative    Former Education officer, environmental for [employer]; moved to [place] area 11/02.  Previously in [place].  Lives alone.  Single since '05.  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             Exercise: daily  Diet: adequate nutrition.    Review of patient's family history indicates:  Problem: Mental/Emotional Disorders      Relation: Mother          Age of Onset: (Not Specified)  Problem: Mental/Emotional Disorders      Relation: Father          Age of Onset: (Not Specified)  Problem: Mental/Emotional Disorders      Relation: Sister          Age of Onset: (Not Specified)  Problem: Cancer - Breast      Relation: Mother          Age of Onset: (Not Specified)          Comment: dx'd at 49. D. age 99.      ROS:  Constitutional: benign  Allergy: environmental  Endocrine: vaso-motor flushing  Dermatologic: notes purple great toenail (part of it) on right foot, not sure when that happened  Cardiovascular:   Respiratory:   Gastrointestinal:   Genitourinary: negative  Musculoskeletal:   Neurological:   Psychiatric: unchanged    PHYSICAL EXAM:  Constitutional: well developed, well nourished White female  Skin: clear  Neurological: normal and alert and oriented  Lymphatic:   Eyes: normal and anicteric  ENT: external ear normal  Neck: supple  Thyroid: not visibly enlarged    PELVIC:  External Genitalia: normal architecture, no lesions and no discharge  Urethral Meatus: normal size and location, without lesions or prolapse  Urethra: without masses, tenderness or scarring  Bladder: not examined  Vagina: thin-walled/atrophic and no lesions  Vaginal Discharge: estrogen creme only noted  Pelvic supports: normal  Cervix: nulliparous appearance  Uterus:   Adnexa:  Extremities: normal, no cyanosis, clubbing and no edema.  Right great toenail with lateral purplish discoloration consistent with subungual hematoma, nontender.  Anus and Perineum: normal  Rectum: deferred      ASSESSMENT & PLAN:  (Z01.419) Encounter for gynecological examination without abnormal finding  (primary encounter diagnosis)  Comment: post menopausal, if today's smear is negative can stop with pap  smears - low risk, noly abnormal was ASCUS neg HPV in 2009  Plan: [place]        Await results.    (Z12.4) Screening for cervical cancer  Comment: as above.  Plan: CYTOPATH, C/V, THIN LAYER, OBTAINING SCREEN PAP        SMEAR, HUMAN PAPILLOMAVIRUS (HPV)               COUNSELING:  diet and nutrition, exercise and post menopausal sexuality

## 2018-08-10 LAB — HUMAN PAPILLOMAVIRUS (HPV): HUMAN PAPILLOMAVIRUS: NEGATIVE

## 2018-08-12 LAB — CYTOPATH, C/V, THIN LAYER

## 2018-08-13 ENCOUNTER — Telehealth (HOSPITAL_BASED_OUTPATIENT_CLINIC_OR_DEPARTMENT_OTHER): Payer: Self-pay | Admitting: Community Health

## 2018-08-13 NOTE — Progress Notes (Signed)
Pt requests appt for Medicare Annual Wellness visit. Only had GYN exam on 08/09/18. Appt scheduled.

## 2018-08-13 NOTE — Telephone Encounter (Signed)
-----   Message from [place] sent at 08/13/2018  4:35 PM EST -----  Regarding: PE concerns  Contact: [phone]  Brianna [name],  [id number],  63 year old,  female  Telephone Information:  Home Phone      [phone]  Work Phone      [phone]  Mobile          [phone]    Patient's PCP: Joanne Chars, MD    Patient's language of care:   [place] does not need an interpreter.    Person calling on behalf of patient: Patient (self)     Pt has concerns and questions regarding PE on 08/09/18. Pt would like to speak to RN      CALL BACK NUMBER: [phone]

## 2018-08-17 ENCOUNTER — Encounter (HOSPITAL_BASED_OUTPATIENT_CLINIC_OR_DEPARTMENT_OTHER): Payer: Self-pay | Admitting: Family Medicine

## 2018-08-20 ENCOUNTER — Ambulatory Visit (HOSPITAL_BASED_OUTPATIENT_CLINIC_OR_DEPARTMENT_OTHER): Payer: Self-pay | Admitting: Family Medicine

## 2018-09-08 ENCOUNTER — Other Ambulatory Visit (HOSPITAL_BASED_OUTPATIENT_CLINIC_OR_DEPARTMENT_OTHER): Payer: Self-pay | Admitting: Psychiatry

## 2018-09-08 DIAGNOSIS — F331 Major depressive disorder, recurrent, moderate: Principal | ICD-10-CM

## 2018-09-13 ENCOUNTER — Emergency Department (HOSPITAL_COMMUNITY): Payer: Medicare (Managed Care)

## 2018-09-13 ENCOUNTER — Encounter (HOSPITAL_COMMUNITY): Payer: Self-pay | Admitting: Emergency Medicine

## 2018-09-13 ENCOUNTER — Emergency Department (HOSPITAL_COMMUNITY)
Admission: EM | Admit: 2018-09-13 | Discharge: 2018-09-13 | Disposition: A | Discharge status: Home / Self Care | Payer: Medicare (Managed Care) | Attending: Emergency Medicine | Admitting: Emergency Medicine

## 2018-09-13 ENCOUNTER — Other Ambulatory Visit: Payer: Self-pay

## 2018-09-13 ENCOUNTER — Ambulatory Visit (HOSPITAL_BASED_OUTPATIENT_CLINIC_OR_DEPARTMENT_OTHER): Payer: Self-pay | Admitting: Registered Nurse

## 2018-09-13 DIAGNOSIS — R079 Chest pain, unspecified: Secondary | ICD-10-CM | POA: Diagnosis present

## 2018-09-13 HISTORY — DX: Depression, unspecified: F32.A

## 2018-09-13 HISTORY — DX: Anxiety disorder, unspecified: F41.9

## 2018-09-13 HISTORY — DX: Major depressive disorder, single episode, unspecified: F32.9

## 2018-09-13 LAB — CBC WITH DIFFERENTIAL/PLATELET
Abs Immature Granulocytes: 0.01 10*3/uL (ref 0.00–0.07)
Basophils Absolute: 0 10*3/uL (ref 0.0–0.1)
Basophils Relative: 1 %
Eosinophils Absolute: 0.1 10*3/uL (ref 0.0–0.5)
Eosinophils Relative: 1 %
HEMATOCRIT: 38.8 % (ref 36.0–46.0)
Hemoglobin: 12.7 g/dL (ref 12.0–15.0)
Immature Granulocytes: 0 %
Lymphocytes Relative: 17 %
Lymphs Abs: 1.1 10*3/uL (ref 0.7–4.0)
MCH: 32 pg (ref 26.0–34.0)
MCHC: 32.7 g/dL (ref 30.0–36.0)
MCV: 97.7 fL (ref 80.0–100.0)
Monocytes Absolute: 0.6 10*3/uL (ref 0.1–1.0)
Monocytes Relative: 9 %
Neutro Abs: 4.8 10*3/uL (ref 1.7–7.7)
Neutrophils Relative %: 72 %
Platelets: 182 10*3/uL (ref 150–400)
RBC: 3.97 MIL/uL (ref 3.87–5.11)
RDW: 11.8 % (ref 11.5–15.5)
WBC: 6.6 10*3/uL (ref 4.0–10.5)
nRBC: 0 % (ref 0.0–0.2)

## 2018-09-13 LAB — COMPREHENSIVE METABOLIC PANEL
ALT: 25 U/L (ref 0–44)
AST: 28 U/L (ref 15–41)
Albumin: 3.5 g/dL (ref 3.5–5.0)
Alkaline Phosphatase: 43 U/L (ref 38–126)
Anion gap: 8 (ref 5–15)
BUN: 19 mg/dL (ref 8–23)
CO2: 26 mmol/L (ref 22–32)
Calcium: 9 mg/dL (ref 8.9–10.3)
Chloride: 109 mmol/L (ref 98–111)
Creatinine, Ser: 0.83 mg/dL (ref 0.44–1.00)
GFR calc Af Amer: >60 mL/min (ref >60)
GFR calc non Af Amer: >60 mL/min (ref >60)
Glucose, Bld: 99 mg/dL (ref 70–99)
Potassium: 4.3 mmol/L (ref 3.5–5.1)
Sodium: 143 mmol/L (ref 135–145)
Total Bilirubin: 0.7 mg/dL (ref 0.3–1.2)
Total Protein: 6.2 g/dL — ABNORMAL LOW (ref 6.5–8.1)

## 2018-09-13 LAB — I-STAT TROPONIN, ED
TROPONIN I, POC: 0 ng/mL (ref 0.00–0.08)
Troponin i, poc: 0.01 ng/mL (ref 0.00–0.08)

## 2018-09-13 NOTE — ED Triage Notes (Addendum)
States had left cp pain sat night and Sunday , no hx states looked on goggle and states it was a coronary artery spasm no n/v/sob but it did radiate to her neck and arm. Pt states she has been under a lot of stress lately has some mental issues she states, pt states she is thinking of moving here , took the train 2 days ago but doesn't know anyone here

## 2018-09-13 NOTE — ED Provider Notes (Signed)
[facility] EMERGENCY DEPARTMENT Provider Note   CSN: [id number] Arrival date & time: 09/13/18  9702     History   Chief Complaint Chief Complaint  Patient presents with  . Chest Pain    HPI Janet Osborn is a 63 y.o. female.  63 year old female with prior medical history as detailed below presents for evaluation of reported chest discomfort.  Patient reports an episode of left-sided chest pain that occurred 24 hours ago.  Symptoms lasted approximately 1 hour.  Patient reports left-sided chest pressure and discomfort that radiates to the left shoulder.  She is currently pain-free.  She denies associated fever.  She denies prior history of CAD or other cardiac disorder.  She denies prior history of cardiac testing.  She apparently lives in [place] and is here visiting.  The history is provided by the patient and medical records.  Chest Pain  Pain location:  L chest and substernal area Pain quality: aching   Pain radiates to:  Does not radiate Pain severity:  Mild Onset quality:  Sudden Timing:  Constant Progression:  Resolved Chronicity:  New Worsened by:  Nothing Ineffective treatments:  None tried   Past Medical History:  Diagnosis Date  . Anxiety   . Depression     There are no active problems to display for this patient.     OB History   No obstetric history on file.      Home Medications    Prior to Admission medications   Not on File    Family History No family history on file.  Social History Social History   Tobacco Use  . Smoking status: Never Smoker  . Smokeless tobacco: Never Used  Substance Use Topics  . Alcohol use: Not on file  . Drug use: Not on file     Allergies   Penicillins   Review of Systems Review of Systems  Cardiovascular: Positive for chest pain.  All other systems reviewed and are negative.    Physical Exam Updated Vital Signs BP 106/78 (BP Location: Right Arm)   Pulse 65    Temp 98.8 F (37.1 C)   Resp 20   Ht 5\' 3"  (1.6 m)   Wt 51.7 kg   SpO2 99%   BMI 20.19 kg/m   Physical Exam Vitals signs and nursing note reviewed.  Constitutional:      General: She is not in acute distress.    Appearance: She is well-developed.  HENT:     Head: Normocephalic and atraumatic.  Eyes:     Conjunctiva/sclera: Conjunctivae normal.     Pupils: Pupils are equal, round, and reactive to light.  Neck:     Musculoskeletal: Normal range of motion and neck supple.  Cardiovascular:     Rate and Rhythm: Normal rate and regular rhythm.     Heart sounds: Normal heart sounds.  Pulmonary:     Effort: Pulmonary effort is normal. No respiratory distress.     Breath sounds: Normal breath sounds.  Abdominal:     General: There is no distension.     Palpations: Abdomen is soft.     Tenderness: There is no abdominal tenderness.  Musculoskeletal: Normal range of motion.        General: No deformity.  Skin:    General: Skin is warm and dry.  Neurological:     Mental Status: She is alert and oriented to person, place, and time.      ED Treatments / Results  Labs (all  labs ordered are listed, but only abnormal results are displayed) Labs Reviewed  COMPREHENSIVE METABOLIC PANEL - Abnormal; Notable for the following components:      Result Value   Total Protein 6.2 (*)    All other components within normal limits  CBC WITH DIFFERENTIAL/PLATELET  I-STAT TROPONIN, ED  I-STAT TROPONIN, ED    EKG EKG Interpretation  Date/Time:  Monday September 13 2018 09:37:27 EST Ventricular Rate:  63 PR Interval:    QRS Duration: 93 QT Interval:  382 QTC Calculation: 391 R Axis:   69 Text Interpretation:  Sinus rhythm Consider left atrial enlargement Consider left ventricular hypertrophy Abnrm T, consider ischemia, anterolateral lds Baseline wander in lead(s) V5 Confirmed by Kristine RoyalMessick, Peter [phone]([id number]) on 09/13/2018 9:43:19 AM   Radiology Dg Chest Port 1 View  Result Date:  09/13/2018 CLINICAL DATA:  Left-sided chest pain for several days EXAM: PORTABLE CHEST 1 VIEW COMPARISON:  None. FINDINGS: The heart size and mediastinal contours are within normal limits. Both lungs are clear. The visualized skeletal structures are unremarkable. IMPRESSION: No active disease. Electronically Signed   By: Alcide CleverMark  Lukens M.D.   On: 09/13/2018 09:57    Procedures Procedures (including critical care time)  Medications Ordered in ED Medications - No data to display   Initial Impression / Assessment and Plan / ED Course  I have reviewed the triage vital signs and the nursing notes.  Pertinent labs & imaging results that were available during my care of the patient were reviewed by me and considered in my medical decision making (see chart for details).     MDM  Screen complete  Patient is presenting for evaluation of chest pain.  Patient's described symptoms are somewhat atypical.  Initial EKG is without evidence of acute ischemia.  Troponin x2 is negative.  Patient's other screening labs are without significant abnormality.   Patient's heart score is 3.  Patient is appropriate for discharge.  Patient does understand the need for close follow-up.  Strict return precautions given and understood.  Final Clinical Impressions(s) / ED Diagnoses   Final diagnoses:  Chest pain, unspecified type    ED Discharge Orders    None       Wynetta FinesMessick, Peter C, MD 09/13/18 1415

## 2018-09-13 NOTE — Discharge Instructions (Addendum)
Please return for any problem.  Follow-up with your regular care provider as instructed. °

## 2018-09-13 NOTE — ED Notes (Signed)
Dr Rodena Medin into speak to pt about normal labs and xray

## 2018-09-13 NOTE — Telephone Encounter (Signed)
TC to patient. Patient is waiting to be discharged.  No heart attack.  Patient scheduled for Follow up appointment with [facility] Thursday 09/16/2018 at 1pm for 40 minutes (she is always scheduled for 40 min.)  Patient verbalized understanding and is without further questions or concerns at this time.  Esaw Dace, RN 09/13/18 2:44 PM

## 2018-09-13 NOTE — Telephone Encounter (Signed)
-----   Message from Lolita Lenz sent at 09/13/2018  8:19 AM EST -----  Brianna [name],  [id number],  63 year old,  female  Telephone Information:  Home Phone      [phone]  Work Phone      [phone]  Mobile          [phone]    Patient's PCP: Joanne Chars, MD    Patient's language of care:   English  Caller does not need an interpreter.    Person calling on behalf of patient: Patient (self) /     Pt would not  like to be seen today. Offered next available appt on Pt declined appt.   What are the symptoms: Pt states in the middle of the night she had pain on left shoulder and she was not sure how long was the pain shoulders been very soar she would like to speak with a nurse   How long has patient been sick?3 days  Where is the patient located at the moment: out of town    [place]: [place]

## 2018-09-13 NOTE — Telephone Encounter (Signed)
Patient calls today to report that while she was sleeping she had a "coronary artery spasm." "My heart hurts" "I thought it was a heart attack"  Left upper chest "grinding pain." "It was terrifying." Pain 10/10 when it happened. Now 3/10.  Shoulder pain x 3 days left shoulder.  Patient is out of town, [place].   Patient is alone.  Under a lot of emotional stress.   Denies Jaw pain  Denies sweating or diaphoresis.  Has not used any mediation.  Had some intermittent SOB "It hurt when I was trying to take a deep breath.    Any history of cardiac issues or stroke? (Angina, Heart Attack, bypass surgery, CHF, angina, Hypertension, diabetic, high cholesterol, obesity, smoker or strong family history of heart disease?) Denies  Any history of blood clots? Denies.        SOB or difficulty with breathing Intermittent.  Recent pneumonia denies.  Coughing up anything (more specifically blood?) denies  Speaking well? Difficulty with completing sentences denies  Fainted or passed out? Denies  Tachycardia or palpitations? Denies  Recent recreational drug use such as cocaine? Denies    Recent illness NO (prolonged bedrest which could precipitate a clot)  Major recent surgery? NO  Recent hip or leg fracture? NO  Fever NO  Headache NO   Recent travel YES took train to [facility]    Disposition: Emergency Department in [place]   [facility]  [address] [place] [address]  [phone]    Esaw Dace, RN 09/13/18 8:35 AM

## 2018-09-16 ENCOUNTER — Encounter (HOSPITAL_BASED_OUTPATIENT_CLINIC_OR_DEPARTMENT_OTHER): Payer: Self-pay | Admitting: Family Medicine

## 2018-09-16 ENCOUNTER — Ambulatory Visit: Payer: No Typology Code available for payment source | Attending: Family Medicine | Admitting: Family Medicine

## 2018-09-16 VITALS — BP 105/64 | HR 72 | Temp 98.3°F | Wt 116.0 lb

## 2018-09-16 DIAGNOSIS — M67922 Unspecified disorder of synovium and tendon, left upper arm: Secondary | ICD-10-CM | POA: Insufficient documentation

## 2018-09-16 DIAGNOSIS — R0782 Intercostal pain: Secondary | ICD-10-CM | POA: Diagnosis present

## 2018-09-16 DIAGNOSIS — Z1211 Encounter for screening for malignant neoplasm of colon: Secondary | ICD-10-CM | POA: Diagnosis present

## 2018-09-16 NOTE — Progress Notes (Signed)
Patient presents with:  Chest Pain  Shoulder Pain: Left shoulder pain.            Brianna [name] is a 63 year old female presents with left sided chest pain while in [place].   Had left sided extreme pain in her left chest and left shoulder (into Sunday morning).   She was very worried that this was a heart attack.   It eventually stopped- she thinks after a half hour but it felt like forever.   The next day she had soreness in that area.   SHe read things online and she was concerned that it was due to a coronary spasms- she was also under a lot of emotional stress which she felt it was playing a role.   She called the clinic asking for advice on this and she went to the ER in [place] where she had EKG, blood testing.   She was told at the ER that everything was fine. SHe feels that the test ruled out a heart attack but she didn't feel it addressed her concern about a coronary vasospasm.   She was told it was likely due to a muscle spasm   The day her pain started she did have to help her aunt lift a futon box that was delivered.     ER Records reviewed.   cxr normal  Troponin x 2 negative  Cbc and cmp negative.   Per record EKG was negative.   Random glucose of 99    No results found for: OCB  ONBOARD CONTROL PRESENT? (no units)   Date Value   04/06/2017 Yes   11/05/2016 Yes       BP 105/64 (Site: RA, Position: Sitting, Cuff Size: Reg)  Pulse 72  Temp 98.3 F (36.8 C) (Oral)  Wt 52.6 kg (116 lb)  LMP  (LMP Unknown)  SpO2 96%  BMI 20.81 kg/m2  GA: Well appearing in no acute distress, non toxic appearing  HEENT: normocephalic, atraumatic, Mucous membranes are moist. Pupils normal size. EOMI.   Lungs: Clear to auscultation. No wheezes, crackles or rales.   Heart: Normal rate and rhythm. No murmurs, rubs or gallops.   Mild chest wall tenderness to palpation on the left side of her sternum.   Normal ROM at the shoulders.   Tenderness to palpation at the left biceps tendon with positive speed test. Negative empty can,  hawking and Neer.   Neurologic: no focal deficits noted, alert, Appropriate speech. Normal gait observed  Psychiatric: Well groomed. Appropriate affect. Judgement and thought content are normal. Responds to questions appropriately.         A/P:     1. Intercostal pain  2. Tendinopathy of left biceps tendon  Reviewed her negative ER findings. Discussed that her symptoms are not consistent with ischemia or coronary vasospasm. On exam she has some chest wall tenderness and left biceps tendon tenderness likely due to strain while lifting the futon box. Her sypmtoms already seem to be improved. Reassured her at this time. Discussed that if her pain persists we should consider PT.     3. Colon cancer screening  - POC IMMUNOASSAY FECAL OCCULT BLOOD TEST; Future    I have spent 25 minutes in face to face time with this patient/patient proxy of which > 50% was in counseling or coordination of care regarding above issues/Dx.        We discussed the patient's current medications. The patient expressed understanding and no barriers to adherence  were identified. Medications were reconciled during this visit and a current medication list was given to the patient at the end of the visit.        Nickolas Madrid, MD, 09/16/18, 1:07 PM

## 2018-10-19 ENCOUNTER — Ambulatory Visit: Payer: No Typology Code available for payment source | Attending: Family Medicine | Admitting: Lab

## 2018-10-19 DIAGNOSIS — Z1211 Encounter for screening for malignant neoplasm of colon: Secondary | ICD-10-CM | POA: Diagnosis present

## 2018-10-19 LAB — POC IMMUNOASSAY FECAL OCCULT BLOOD TEST: POC FECAL OCCULT BLOOD TEST (IMMUNOASSAY): NEGATIVE

## 2018-10-19 NOTE — Progress Notes (Signed)
specimen dropped off

## 2018-10-20 ENCOUNTER — Encounter (HOSPITAL_BASED_OUTPATIENT_CLINIC_OR_DEPARTMENT_OTHER): Payer: Self-pay | Admitting: Family Medicine

## 2020-09-25 DIAGNOSIS — R6889 Other general symptoms and signs: Secondary | ICD-10-CM | POA: Diagnosis not present

## 2020-11-09 DIAGNOSIS — M5432 Sciatica, left side: Secondary | ICD-10-CM | POA: Diagnosis not present

## 2020-11-16 DIAGNOSIS — M5432 Sciatica, left side: Secondary | ICD-10-CM | POA: Diagnosis not present

## 2020-11-20 DIAGNOSIS — M545 Low back pain, unspecified: Secondary | ICD-10-CM | POA: Diagnosis not present

## 2020-11-22 DIAGNOSIS — M545 Low back pain, unspecified: Secondary | ICD-10-CM | POA: Diagnosis not present

## 2020-11-28 DIAGNOSIS — M545 Low back pain, unspecified: Secondary | ICD-10-CM | POA: Diagnosis not present

## 2020-11-28 DIAGNOSIS — M5416 Radiculopathy, lumbar region: Secondary | ICD-10-CM | POA: Diagnosis not present

## 2020-11-29 DIAGNOSIS — M5416 Radiculopathy, lumbar region: Secondary | ICD-10-CM | POA: Diagnosis not present

## 2020-11-29 DIAGNOSIS — M545 Low back pain, unspecified: Secondary | ICD-10-CM | POA: Diagnosis not present

## 2020-12-03 DIAGNOSIS — M5416 Radiculopathy, lumbar region: Secondary | ICD-10-CM | POA: Diagnosis not present

## 2020-12-07 DIAGNOSIS — M5416 Radiculopathy, lumbar region: Secondary | ICD-10-CM | POA: Diagnosis not present

## 2020-12-07 DIAGNOSIS — M545 Low back pain, unspecified: Secondary | ICD-10-CM | POA: Diagnosis not present

## 2020-12-10 DIAGNOSIS — M545 Low back pain, unspecified: Secondary | ICD-10-CM | POA: Diagnosis not present

## 2020-12-10 DIAGNOSIS — M5416 Radiculopathy, lumbar region: Secondary | ICD-10-CM | POA: Diagnosis not present

## 2020-12-12 DIAGNOSIS — M5416 Radiculopathy, lumbar region: Secondary | ICD-10-CM | POA: Diagnosis not present

## 2020-12-12 DIAGNOSIS — M545 Low back pain, unspecified: Secondary | ICD-10-CM | POA: Diagnosis not present

## 2020-12-19 DIAGNOSIS — M5416 Radiculopathy, lumbar region: Secondary | ICD-10-CM | POA: Diagnosis not present

## 2020-12-21 DIAGNOSIS — M5416 Radiculopathy, lumbar region: Secondary | ICD-10-CM | POA: Diagnosis not present

## 2020-12-21 DIAGNOSIS — M545 Low back pain, unspecified: Secondary | ICD-10-CM | POA: Diagnosis not present

## 2020-12-24 DIAGNOSIS — M5416 Radiculopathy, lumbar region: Secondary | ICD-10-CM | POA: Diagnosis not present

## 2021-01-04 DIAGNOSIS — Z13220 Encounter for screening for lipoid disorders: Secondary | ICD-10-CM | POA: Diagnosis not present

## 2021-01-04 DIAGNOSIS — Z23 Encounter for immunization: Secondary | ICD-10-CM | POA: Diagnosis not present

## 2021-01-04 DIAGNOSIS — Z0000 Encounter for general adult medical examination without abnormal findings: Secondary | ICD-10-CM | POA: Diagnosis not present

## 2021-01-09 DIAGNOSIS — M5416 Radiculopathy, lumbar region: Secondary | ICD-10-CM | POA: Diagnosis not present

## 2021-01-14 ENCOUNTER — Other Ambulatory Visit: Payer: Self-pay | Admitting: Family Medicine

## 2021-01-14 DIAGNOSIS — E2839 Other primary ovarian failure: Secondary | ICD-10-CM

## 2021-01-15 DIAGNOSIS — M545 Low back pain, unspecified: Secondary | ICD-10-CM | POA: Diagnosis not present

## 2021-01-15 DIAGNOSIS — M5416 Radiculopathy, lumbar region: Secondary | ICD-10-CM | POA: Diagnosis not present

## 2021-01-21 DIAGNOSIS — M545 Low back pain, unspecified: Secondary | ICD-10-CM | POA: Diagnosis not present

## 2021-01-21 DIAGNOSIS — M5416 Radiculopathy, lumbar region: Secondary | ICD-10-CM | POA: Diagnosis not present

## 2021-01-24 DIAGNOSIS — M5416 Radiculopathy, lumbar region: Secondary | ICD-10-CM | POA: Diagnosis not present

## 2021-02-05 DIAGNOSIS — M545 Low back pain, unspecified: Secondary | ICD-10-CM | POA: Diagnosis not present

## 2021-02-05 DIAGNOSIS — M5416 Radiculopathy, lumbar region: Secondary | ICD-10-CM | POA: Diagnosis not present

## 2021-02-06 DIAGNOSIS — R7401 Elevation of levels of liver transaminase levels: Secondary | ICD-10-CM | POA: Diagnosis not present

## 2021-02-07 ENCOUNTER — Ambulatory Visit
Admission: RE | Admit: 2021-02-07 | Discharge: 2021-02-07 | Disposition: A | Discharge status: Home / Self Care | Payer: Medicare Other | Source: Ambulatory Visit | Attending: Family Medicine | Admitting: Family Medicine

## 2021-02-07 ENCOUNTER — Other Ambulatory Visit: Payer: Self-pay

## 2021-02-07 DIAGNOSIS — M8589 Other specified disorders of bone density and structure, multiple sites: Secondary | ICD-10-CM | POA: Diagnosis not present

## 2021-02-07 DIAGNOSIS — E2839 Other primary ovarian failure: Secondary | ICD-10-CM

## 2021-02-07 DIAGNOSIS — Z780 Asymptomatic menopausal state: Secondary | ICD-10-CM | POA: Diagnosis not present

## 2021-03-06 DIAGNOSIS — M5416 Radiculopathy, lumbar region: Secondary | ICD-10-CM | POA: Diagnosis not present

## 2021-06-27 DIAGNOSIS — M5416 Radiculopathy, lumbar region: Secondary | ICD-10-CM | POA: Diagnosis not present

## 2021-10-10 DIAGNOSIS — R5383 Other fatigue: Secondary | ICD-10-CM | POA: Diagnosis not present

## 2021-10-10 DIAGNOSIS — R634 Abnormal weight loss: Secondary | ICD-10-CM | POA: Diagnosis not present

## 2021-10-10 DIAGNOSIS — R319 Hematuria, unspecified: Secondary | ICD-10-CM | POA: Diagnosis not present

## 2021-10-10 DIAGNOSIS — R8290 Unspecified abnormal findings in urine: Secondary | ICD-10-CM | POA: Diagnosis not present

## 2021-10-31 DIAGNOSIS — Z961 Presence of intraocular lens: Secondary | ICD-10-CM | POA: Diagnosis not present

## 2021-11-11 DIAGNOSIS — R5383 Other fatigue: Secondary | ICD-10-CM | POA: Diagnosis not present

## 2021-11-11 DIAGNOSIS — R3129 Other microscopic hematuria: Secondary | ICD-10-CM | POA: Diagnosis not present

## 2021-12-23 DIAGNOSIS — R3121 Asymptomatic microscopic hematuria: Secondary | ICD-10-CM | POA: Diagnosis not present

## 2022-01-01 DIAGNOSIS — M5416 Radiculopathy, lumbar region: Secondary | ICD-10-CM | POA: Diagnosis not present

## 2022-01-10 DIAGNOSIS — E7800 Pure hypercholesterolemia, unspecified: Secondary | ICD-10-CM | POA: Diagnosis not present

## 2022-01-10 DIAGNOSIS — H9191 Unspecified hearing loss, right ear: Secondary | ICD-10-CM | POA: Diagnosis not present

## 2022-01-10 DIAGNOSIS — Z0000 Encounter for general adult medical examination without abnormal findings: Secondary | ICD-10-CM | POA: Diagnosis not present

## 2022-01-10 DIAGNOSIS — R7301 Impaired fasting glucose: Secondary | ICD-10-CM | POA: Diagnosis not present

## 2022-01-29 DIAGNOSIS — R008 Other abnormalities of heart beat: Secondary | ICD-10-CM | POA: Diagnosis not present

## 2022-01-29 DIAGNOSIS — R000 Tachycardia, unspecified: Secondary | ICD-10-CM | POA: Diagnosis not present

## 2022-01-29 DIAGNOSIS — F32A Depression, unspecified: Secondary | ICD-10-CM | POA: Diagnosis not present

## 2022-02-04 ENCOUNTER — Ambulatory Visit (INDEPENDENT_AMBULATORY_CARE_PROVIDER_SITE_OTHER): Payer: Medicare Other | Admitting: Cardiology

## 2022-02-04 ENCOUNTER — Encounter: Payer: Self-pay | Admitting: Cardiology

## 2022-02-04 VITALS — BP 128/72 | HR 63 | Ht 62.5 in | Wt 110.8 lb

## 2022-02-04 DIAGNOSIS — R002 Palpitations: Secondary | ICD-10-CM

## 2022-02-20 ENCOUNTER — Ambulatory Visit (HOSPITAL_COMMUNITY): Payer: Medicare Other | Attending: Internal Medicine

## 2022-02-20 DIAGNOSIS — I493 Ventricular premature depolarization: Secondary | ICD-10-CM | POA: Diagnosis not present

## 2022-02-20 DIAGNOSIS — R002 Palpitations: Secondary | ICD-10-CM | POA: Diagnosis not present

## 2022-02-20 DIAGNOSIS — R0600 Dyspnea, unspecified: Secondary | ICD-10-CM | POA: Insufficient documentation

## 2022-02-20 DIAGNOSIS — I340 Nonrheumatic mitral (valve) insufficiency: Secondary | ICD-10-CM

## 2022-02-20 LAB — ECHOCARDIOGRAM COMPLETE
Area-P 1/2: 3.11 cm2
MV M vel: 4.94 m/s
MV Peak grad: 97.6 mmHg
P 1/2 time: 428 msec
S' Lateral: 1.9 cm

## 2022-02-25 ENCOUNTER — Encounter: Payer: Self-pay | Admitting: *Deleted

## 2022-02-28 DIAGNOSIS — R008 Other abnormalities of heart beat: Secondary | ICD-10-CM | POA: Diagnosis not present

## 2022-03-04 ENCOUNTER — Encounter: Payer: Self-pay | Admitting: *Deleted

## 2022-05-29 DIAGNOSIS — F32A Depression, unspecified: Secondary | ICD-10-CM | POA: Diagnosis not present

## 2022-07-21 NOTE — Progress Notes (Deleted)
     HPI: FU palpitations.  Echocardiogram July 2023 showed vigorous LV function, mild dynamic outflow obstruction, mild left atrial enlargement, mild mitral regurgitation and trace aortic insufficiency.  Since last seen,   Current Outpatient Medications  Medication Sig Dispense Refill   co-enzyme Q-10 30 MG capsule Take 30 mg by mouth daily.     escitalopram (LEXAPRO) 5 MG tablet Take 5 mg by mouth daily.     fluticasone (FLONASE) 50 MCG/ACT nasal spray Place 1 spray into both nostrils daily as needed for allergies.     meloxicam (MOBIC) 15 MG tablet Take 15 mg by mouth every morning.     Misc Natural Products (CURCUMAX PRO PO) Take 1 tablet by mouth daily.     Multiple Vitamin (MULTIVITAMIN) tablet Take 1 tablet by mouth daily.     Omega-3 Fatty Acids (FISH OIL) 1000 MG CAPS Take 1,000 mg by mouth daily.     propranolol (INDERAL) 10 MG tablet Take 10 mg by mouth 2 (two) times daily.     Vitamin E 134 MG (200 UNIT) TABS 1 tablet     No current facility-administered medications for this visit.     Past Medical History:  Diagnosis Date   Anxiety    Depression     Past Surgical History:  Procedure Laterality Date   HERNIA REPAIR     TONSILLECTOMY      Social History   Socioeconomic History   Marital status: Single    Spouse name: Not on file   Number of children: Not on file   Years of education: Not on file   Highest education level: Not on file  Occupational History   Not on file  Tobacco Use   Smoking status: Never   Smokeless tobacco: Never  Substance and Sexual Activity   Alcohol use: Yes    Comment: Rare   Drug use: Not on file   Sexual activity: Not on file  Other Topics Concern   Not on file  Social History Narrative   Not on file   Social Determinants of Health   Financial Resource Strain: Not on file  Food Insecurity: Not on file  Transportation Needs: Not on file  Physical Activity: Not on file  Stress: Not on file  Social Connections: Not on  file  Intimate Partner Violence: Not on file    Family History  Problem Relation Age of Onset   Arrhythmia Mother    Stroke Father     ROS: no fevers or chills, productive cough, hemoptysis, dysphasia, odynophagia, melena, hematochezia, dysuria, hematuria, rash, seizure activity, orthopnea, PND, pedal edema, claudication. Remaining systems are negative.  Physical Exam: Well-developed well-nourished in no acute distress.  Skin is warm and dry.  HEENT is normal.  Neck is supple.  Chest is clear to auscultation with normal expansion.  Cardiovascular exam is regular rate and rhythm.  Abdominal exam nontender or distended. No masses palpated. Extremities show no edema. neuro grossly intact  ECG- personally reviewed  A/P  1 palpitations-LV function is normal.  2 anxiety-managed by primary care.  Olga Millers, MD

## 2022-07-31 ENCOUNTER — Ambulatory Visit: Payer: Medicare Other | Admitting: Cardiology

## 2022-08-14 DIAGNOSIS — H8110 Benign paroxysmal vertigo, unspecified ear: Secondary | ICD-10-CM | POA: Diagnosis not present

## 2022-08-14 DIAGNOSIS — R197 Diarrhea, unspecified: Secondary | ICD-10-CM | POA: Diagnosis not present

## 2022-10-23 DIAGNOSIS — K30 Functional dyspepsia: Secondary | ICD-10-CM | POA: Diagnosis not present

## 2022-10-23 DIAGNOSIS — R6883 Chills (without fever): Secondary | ICD-10-CM | POA: Diagnosis not present

## 2022-10-23 DIAGNOSIS — R202 Paresthesia of skin: Secondary | ICD-10-CM | POA: Diagnosis not present

## 2022-10-23 DIAGNOSIS — Z604 Social exclusion and rejection: Secondary | ICD-10-CM | POA: Diagnosis not present

## 2022-10-23 DIAGNOSIS — F32A Depression, unspecified: Secondary | ICD-10-CM | POA: Diagnosis not present

## 2022-10-23 DIAGNOSIS — R232 Flushing: Secondary | ICD-10-CM | POA: Diagnosis not present

## 2022-11-25 DIAGNOSIS — R232 Flushing: Secondary | ICD-10-CM | POA: Diagnosis not present

## 2022-11-25 DIAGNOSIS — H8110 Benign paroxysmal vertigo, unspecified ear: Secondary | ICD-10-CM | POA: Diagnosis not present

## 2022-12-25 ENCOUNTER — Encounter (HOSPITAL_BASED_OUTPATIENT_CLINIC_OR_DEPARTMENT_OTHER): Payer: Self-pay

## 2023-01-15 DIAGNOSIS — M8580 Other specified disorders of bone density and structure, unspecified site: Secondary | ICD-10-CM | POA: Diagnosis not present

## 2023-01-15 DIAGNOSIS — F32A Depression, unspecified: Secondary | ICD-10-CM | POA: Diagnosis not present

## 2023-01-15 DIAGNOSIS — Z23 Encounter for immunization: Secondary | ICD-10-CM | POA: Diagnosis not present

## 2023-01-15 DIAGNOSIS — Z0000 Encounter for general adult medical examination without abnormal findings: Secondary | ICD-10-CM | POA: Diagnosis not present

## 2023-01-15 DIAGNOSIS — E7800 Pure hypercholesterolemia, unspecified: Secondary | ICD-10-CM | POA: Diagnosis not present

## 2023-01-15 DIAGNOSIS — Z1231 Encounter for screening mammogram for malignant neoplasm of breast: Secondary | ICD-10-CM | POA: Diagnosis not present

## 2023-01-15 DIAGNOSIS — Z1211 Encounter for screening for malignant neoplasm of colon: Secondary | ICD-10-CM | POA: Diagnosis not present

## 2023-01-15 DIAGNOSIS — Z604 Social exclusion and rejection: Secondary | ICD-10-CM | POA: Diagnosis not present

## 2023-01-22 ENCOUNTER — Other Ambulatory Visit: Payer: Self-pay | Admitting: Family Medicine

## 2023-01-22 DIAGNOSIS — Z1231 Encounter for screening mammogram for malignant neoplasm of breast: Secondary | ICD-10-CM

## 2023-01-22 DIAGNOSIS — M8580 Other specified disorders of bone density and structure, unspecified site: Secondary | ICD-10-CM

## 2023-06-23 ENCOUNTER — Other Ambulatory Visit: Payer: Self-pay | Admitting: Family Medicine

## 2023-06-23 ENCOUNTER — Encounter: Payer: Self-pay | Admitting: Family Medicine

## 2023-06-23 DIAGNOSIS — Z1231 Encounter for screening mammogram for malignant neoplasm of breast: Secondary | ICD-10-CM

## 2023-06-30 DIAGNOSIS — Z1211 Encounter for screening for malignant neoplasm of colon: Secondary | ICD-10-CM | POA: Diagnosis not present

## 2023-07-01 ENCOUNTER — Other Ambulatory Visit: Payer: Self-pay | Admitting: Physician Assistant

## 2023-07-01 DIAGNOSIS — L729 Follicular cyst of the skin and subcutaneous tissue, unspecified: Secondary | ICD-10-CM | POA: Diagnosis not present

## 2023-07-17 ENCOUNTER — Ambulatory Visit
Admission: RE | Admit: 2023-07-17 | Discharge: 2023-07-17 | Disposition: A | Discharge status: Home / Self Care | Payer: Medicare Other | Source: Ambulatory Visit | Attending: Family Medicine | Admitting: Family Medicine

## 2023-07-17 DIAGNOSIS — M8580 Other specified disorders of bone density and structure, unspecified site: Secondary | ICD-10-CM

## 2023-07-17 DIAGNOSIS — M8588 Other specified disorders of bone density and structure, other site: Secondary | ICD-10-CM | POA: Diagnosis not present

## 2023-07-21 ENCOUNTER — Ambulatory Visit: Payer: Medicare Other

## 2023-07-22 ENCOUNTER — Ambulatory Visit
Admission: RE | Admit: 2023-07-22 | Discharge: 2023-07-22 | Disposition: A | Discharge status: Home / Self Care | Payer: Medicare Other | Source: Ambulatory Visit | Attending: Family Medicine | Admitting: Family Medicine

## 2023-07-22 ENCOUNTER — Encounter: Payer: Self-pay | Admitting: Radiology

## 2023-07-22 DIAGNOSIS — Z1231 Encounter for screening mammogram for malignant neoplasm of breast: Secondary | ICD-10-CM

## 2023-08-18 DIAGNOSIS — E7800 Pure hypercholesterolemia, unspecified: Secondary | ICD-10-CM | POA: Diagnosis not present

## 2023-08-18 DIAGNOSIS — M810 Age-related osteoporosis without current pathological fracture: Secondary | ICD-10-CM | POA: Diagnosis not present

## 2023-10-19 DIAGNOSIS — B349 Viral infection, unspecified: Secondary | ICD-10-CM | POA: Diagnosis not present

## 2023-10-21 DIAGNOSIS — R6889 Other general symptoms and signs: Secondary | ICD-10-CM | POA: Diagnosis not present

## 2023-10-21 DIAGNOSIS — G9331 Postviral fatigue syndrome: Secondary | ICD-10-CM | POA: Diagnosis not present

## 2023-11-05 ENCOUNTER — Other Ambulatory Visit: Payer: Self-pay | Admitting: Family Medicine

## 2023-11-06 DIAGNOSIS — R5383 Other fatigue: Secondary | ICD-10-CM | POA: Diagnosis not present

## 2024-02-02 DIAGNOSIS — M810 Age-related osteoporosis without current pathological fracture: Secondary | ICD-10-CM | POA: Diagnosis not present

## 2024-02-02 DIAGNOSIS — R232 Flushing: Secondary | ICD-10-CM | POA: Diagnosis not present

## 2024-02-09 DIAGNOSIS — N644 Mastodynia: Secondary | ICD-10-CM | POA: Diagnosis not present

## 2024-02-09 DIAGNOSIS — L300 Nummular dermatitis: Secondary | ICD-10-CM | POA: Diagnosis not present

## 2024-04-19 DIAGNOSIS — I10 Essential (primary) hypertension: Secondary | ICD-10-CM | POA: Diagnosis not present

## 2024-04-19 DIAGNOSIS — M810 Age-related osteoporosis without current pathological fracture: Secondary | ICD-10-CM | POA: Diagnosis not present

## 2024-04-19 DIAGNOSIS — Z0000 Encounter for general adult medical examination without abnormal findings: Secondary | ICD-10-CM | POA: Diagnosis not present

## 2024-04-19 DIAGNOSIS — E7800 Pure hypercholesterolemia, unspecified: Secondary | ICD-10-CM | POA: Diagnosis not present

## 2024-04-26 DIAGNOSIS — R5381 Other malaise: Secondary | ICD-10-CM | POA: Diagnosis not present

## 2024-04-26 DIAGNOSIS — K582 Mixed irritable bowel syndrome: Secondary | ICD-10-CM | POA: Diagnosis not present

## 2024-04-26 DIAGNOSIS — R5383 Other fatigue: Secondary | ICD-10-CM | POA: Diagnosis not present

## 2024-04-26 DIAGNOSIS — R519 Headache, unspecified: Secondary | ICD-10-CM | POA: Diagnosis not present

## 2024-06-06 DIAGNOSIS — Z961 Presence of intraocular lens: Secondary | ICD-10-CM | POA: Diagnosis not present

## 2024-06-06 DIAGNOSIS — H04123 Dry eye syndrome of bilateral lacrimal glands: Secondary | ICD-10-CM | POA: Diagnosis not present

## 2024-06-06 DIAGNOSIS — H43813 Vitreous degeneration, bilateral: Secondary | ICD-10-CM | POA: Diagnosis not present

## 2024-06-24 ENCOUNTER — Other Ambulatory Visit: Payer: Self-pay | Admitting: Family Medicine

## 2024-06-24 DIAGNOSIS — Z1231 Encounter for screening mammogram for malignant neoplasm of breast: Secondary | ICD-10-CM

## 2024-07-22 ENCOUNTER — Ambulatory Visit
Admission: RE | Admit: 2024-07-22 | Discharge: 2024-07-22 | Disposition: A | Source: Ambulatory Visit | Attending: Family Medicine | Admitting: Family Medicine

## 2024-07-22 ENCOUNTER — Other Ambulatory Visit: Payer: Self-pay | Admitting: Physician Assistant

## 2024-07-22 DIAGNOSIS — Z1231 Encounter for screening mammogram for malignant neoplasm of breast: Secondary | ICD-10-CM
